# Patient Record
Sex: Female | Born: 1960
Health system: Southern US, Community
[De-identification: ages and names within clinical notes are randomized; demographics above are authoritative.]

## PROBLEM LIST (undated history)

## (undated) DIAGNOSIS — G4730 Sleep apnea, unspecified: Secondary | ICD-10-CM

## (undated) DIAGNOSIS — K219 Gastro-esophageal reflux disease without esophagitis: Secondary | ICD-10-CM

## (undated) DIAGNOSIS — J189 Pneumonia, unspecified organism: Secondary | ICD-10-CM

## (undated) DIAGNOSIS — C801 Malignant (primary) neoplasm, unspecified: Secondary | ICD-10-CM

## (undated) DIAGNOSIS — M1990 Unspecified osteoarthritis, unspecified site: Secondary | ICD-10-CM

## (undated) DIAGNOSIS — Z8719 Personal history of other diseases of the digestive system: Secondary | ICD-10-CM

## (undated) HISTORY — PX: HERNIA REPAIR: SHX51

## (undated) HISTORY — PX: HEEL SPUR SURGERY: SHX665

## (undated) HISTORY — PX: KNEE ARTHROSCOPY: SUR90

## (undated) HISTORY — PX: LAPAROSCOPIC GASTRIC BANDING: SHX1100

## (undated) HISTORY — PX: ABDOMINAL HYSTERECTOMY: SHX81

## (undated) HISTORY — PX: TONSILLECTOMY: SUR1361

## (undated) HISTORY — PX: FRACTURE SURGERY: SHX138

---

## 2007-10-28 ENCOUNTER — Ambulatory Visit: Payer: Self-pay | Admitting: Internal Medicine

## 2009-08-11 ENCOUNTER — Ambulatory Visit: Payer: Self-pay | Admitting: Obstetrics and Gynecology

## 2009-08-13 ENCOUNTER — Ambulatory Visit: Payer: Self-pay | Admitting: Unknown Physician Specialty

## 2011-08-08 ENCOUNTER — Ambulatory Visit: Payer: Self-pay | Admitting: Family Medicine

## 2012-04-30 ENCOUNTER — Ambulatory Visit: Payer: Self-pay | Admitting: Obstetrics and Gynecology

## 2012-04-30 LAB — URINALYSIS, COMPLETE
Bacteria: NONE SEEN
Bilirubin,UR: NEGATIVE
Blood: NEGATIVE
Glucose,UR: NEGATIVE mg/dL
Ketone: NEGATIVE
Leukocyte Esterase: NEGATIVE
Nitrite: NEGATIVE
Ph: 7
Protein: NEGATIVE
RBC,UR: <1 /HPF
Specific Gravity: 1.009
Squamous Epithelial: <1
WBC UR: <1 /HPF

## 2012-04-30 LAB — HEMOGLOBIN: HGB: 15.2 g/dL

## 2012-05-13 ENCOUNTER — Ambulatory Visit: Payer: Self-pay | Admitting: Obstetrics and Gynecology

## 2012-05-13 LAB — PREGNANCY, URINE: Pregnancy Test, Urine: NEGATIVE m[IU]/mL

## 2012-05-14 LAB — PATHOLOGY REPORT

## 2012-05-14 LAB — HEMATOCRIT: HCT: 42.2 % (ref 35.0–47.0)

## 2012-08-08 ENCOUNTER — Ambulatory Visit: Payer: Self-pay | Admitting: Obstetrics and Gynecology

## 2012-12-11 DIAGNOSIS — J189 Pneumonia, unspecified organism: Secondary | ICD-10-CM

## 2012-12-11 HISTORY — DX: Pneumonia, unspecified organism: J18.9

## 2013-03-01 ENCOUNTER — Ambulatory Visit: Payer: Self-pay | Admitting: Physical Medicine and Rehabilitation

## 2013-07-02 ENCOUNTER — Encounter: Payer: Self-pay | Admitting: Physical Medicine and Rehabilitation

## 2013-07-11 ENCOUNTER — Encounter: Payer: Self-pay | Admitting: Physical Medicine and Rehabilitation

## 2013-08-11 ENCOUNTER — Encounter: Payer: Self-pay | Admitting: Physical Medicine and Rehabilitation

## 2013-08-12 ENCOUNTER — Ambulatory Visit: Payer: Self-pay | Admitting: Obstetrics and Gynecology

## 2013-09-01 ENCOUNTER — Other Ambulatory Visit: Payer: Self-pay | Admitting: Family

## 2013-09-01 LAB — CBC WITH DIFFERENTIAL/PLATELET
Basophil #: 0 10*3/uL (ref 0.0–0.1)
Basophil %: 0.3 %
Eosinophil %: 1.3 %
HGB: 14.9 g/dL (ref 12.0–16.0)
Lymphocyte #: 3 10*3/uL (ref 1.0–3.6)
Lymphocyte %: 31.4 %
MCH: 33.5 pg (ref 26.0–34.0)
MCV: 94 fL (ref 80–100)
Monocyte #: 0.8 x10 3/mm (ref 0.2–0.9)
Neutrophil #: 5.5 10*3/uL (ref 1.4–6.5)
Neutrophil %: 58.4 %
Platelet: 222 10*3/uL (ref 150–440)
RDW: 12.3 % (ref 11.5–14.5)
WBC: 9.5 10*3/uL (ref 3.6–11.0)

## 2013-09-01 LAB — COMPREHENSIVE METABOLIC PANEL
Alkaline Phosphatase: 134 U/L (ref 50–136)
Anion Gap: 7 (ref 7–16)
BUN: 14 mg/dL (ref 7–18)
Calcium, Total: 8.9 mg/dL (ref 8.5–10.1)
Osmolality: 275 (ref 275–301)
Potassium: 3.8 mmol/L (ref 3.5–5.1)
SGOT(AST): 32 U/L (ref 15–37)
Sodium: 137 mmol/L (ref 136–145)

## 2013-09-01 LAB — SEDIMENTATION RATE: Erythrocyte Sed Rate: 7 mm/hr (ref 0–30)

## 2013-09-11 ENCOUNTER — Ambulatory Visit: Payer: Self-pay | Admitting: Gastroenterology

## 2013-09-12 LAB — PATHOLOGY REPORT

## 2014-06-02 ENCOUNTER — Ambulatory Visit: Payer: Self-pay | Admitting: Gastroenterology

## 2014-06-29 ENCOUNTER — Ambulatory Visit: Payer: Self-pay | Admitting: Gastroenterology

## 2014-07-01 LAB — PATHOLOGY REPORT

## 2014-08-13 ENCOUNTER — Ambulatory Visit: Payer: Self-pay | Admitting: Obstetrics and Gynecology

## 2014-12-28 ENCOUNTER — Ambulatory Visit: Payer: Self-pay | Admitting: Podiatry

## 2014-12-28 LAB — CBC WITH DIFFERENTIAL/PLATELET
BASOS ABS: 0 10*3/uL (ref 0.0–0.1)
Basophil %: 0.2 %
Eosinophil #: 0.1 10*3/uL (ref 0.0–0.7)
Eosinophil %: 0.8 %
HCT: 43.9 % (ref 35.0–47.0)
HGB: 14.8 g/dL (ref 12.0–16.0)
LYMPHS ABS: 3.2 10*3/uL (ref 1.0–3.6)
Lymphocyte %: 30.2 %
MCH: 32.5 pg (ref 26.0–34.0)
MCHC: 33.7 g/dL (ref 32.0–36.0)
MCV: 97 fL (ref 80–100)
Monocyte #: 0.8 x10 3/mm (ref 0.2–0.9)
Monocyte %: 7.2 %
Neutrophil #: 6.4 10*3/uL (ref 1.4–6.5)
Neutrophil %: 61.6 %
PLATELETS: 225 10*3/uL (ref 150–440)
RBC: 4.55 10*6/uL (ref 3.80–5.20)
RDW: 12.5 % (ref 11.5–14.5)
WBC: 10.4 10*3/uL (ref 3.6–11.0)

## 2015-01-01 ENCOUNTER — Ambulatory Visit: Payer: Self-pay | Admitting: Podiatry

## 2015-04-04 NOTE — Op Note (Signed)
PATIENT NAME:  Lynn Dennis, Lynn Dennis MR#:  [id number] DATE OF BIRTH:  09/30/1961  DATE OF PROCEDURE:  05/13/2012  PREOPERATIVE DIAGNOSIS: Symptomatic fibroid uterus.   POSTOPERATIVE DIAGNOSIS: Symptomatic fibroid uterus.  PROCEDURE: Laparoscopic supracervical hysterectomy and bilateral salpingo-oophorectomy.   SURGEON: Ricky L. Amalia Hailey, MD  ASSISTANT: Laverta Baltimore, MD  ANESTHESIA: General endotracheal by Dr. Marcello Moores.   FINDINGS: Enlarged fibroid uterus, grossly normal tubes and ovaries.   ESTIMATED BLOOD LOSS: 30 mL.   COMPLICATIONS: None.   SPECIMENS: Body of uterus, bilateral tubes, and ovaries.   DRAINS: Foley, removed at the end of the case. Instrument, needle, and sponge counts were correct.   ANTIBIOTICS: None.   PROCEDURE IN DETAIL: The patient was consented and taken to the operating room and placed in the supine position, prepped and draped in the usual sterile fashion after being placed in dorsal lithotomy using Allen stirrups. The cervix was visualized and grasped with a single-tooth and sound was placed through the cervical os and these were Steri-Stripped together and Foley catheter was placed.   An infraumbilical incision was made to permit a #75 port entry with pneumoperitoneum established with findings as noted above. Pneumoperitoneum was established and she was placed in Trendelenburg position. Under direct visualization, I placed 11 mm ports in bilateral lower quadrants. We proceeded with [facility] in the usual fashion with Harmonic scalpel and bipolar when needed. BSO was then carried out with bipolar used on the infundibulopelvics bilaterally.   The left lower quadrant port site was switched out for a morcellator which was then used to remove the uterus in morcellator fashion along with fibroids. The ovaries were removed as well. These were all sent together as uterus, bilateral tubes, and ovaries. The pelvis was copiously irrigated. Small polyps were seen at the right portion  of the cervical stump, which was easily made hemostatic with Kleppinger. With the hand of the operator in the vagina, the cervical canal was cauterized. Pressure was lowered to 6 mmHg. All areas were seen to be hemostatic.   There was a small adhesion at the descending colon, at the left lower quadrant, which was easily reduced with Harmonic scalpel taking care to stay away from the bowel itself.   At this point, the procedure was felt to have achieved maximum efficacy. Ports were removed, pneumoperitoneum was allowed to resolve, and the incisions were closed with deep of zero and subcutaneous with 3-0 Vicryl. Steri-Strips and Band-Aids were placed.   The Foley catheter was removed at the end of the case. A single-tooth tenaculum was removed from the cervix. All instrument, needle, and sponge counts were correct. I anticipate a routine postoperative course.  ____________________________ Rockey Situ. Amalia Hailey, MD rle:slb D: 05/13/2012 08:45:49 ET T: 05/13/2012 10:01:28 ET JOB#: [id number]  cc: Ricky L. Amalia Hailey, MD, <Dictator> Selmer Dominion MD ELECTRONICALLY SIGNED 05/14/2012 14:23

## 2015-04-04 NOTE — Discharge Summary (Signed)
PATIENT NAME:  KEYUANA, WANK MR#:  [id number] DATE OF BIRTH:  November 13, 1961  DATE OF ADMISSION:  05/13/2012 DATE OF DISCHARGE:  05/14/2012  ADMISSION DIAGNOSIS: Fibroid uterus.   DISCHARGE DIAGNOSIS: Fibroid uterus.  PROCEDURE: Laparoscopic supracervical hysterectomy, bilateral salpingo-oophorectomy.   COMPLICATIONS: None.   CONSULTANTS: Anesthesia.   HOSPITAL COURSE: The patient did well postoperatively, tolerating diet on day one, and was discharged home with routine prescriptions, precautions, and follow-up.  ____________________________ Rockey Situ. Amalia Hailey, MD rle:slb D: 05/29/2012 04:42:46 ET T: 05/29/2012 10:19:44 ET JOB#: [id number]  cc: Ricky L. Amalia Hailey, MD, <Dictator> Selmer Dominion MD ELECTRONICALLY SIGNED 05/30/2012 8:59

## 2015-04-05 LAB — SURGICAL PATHOLOGY

## 2015-04-11 NOTE — Op Note (Signed)
PATIENT NAME:  Lynn Dennis, Lynn Dennis MR#:  [id number] DATE OF BIRTH:  03-Jan-1961  DATE OF PROCEDURE:  01/01/2015  PREOPERATIVE DIAGNOSIS: Plantar fasciitis with heel spur, right foot.   POSTOPERATIVE DIAGNOSIS: Plantar fasciitis with heel spur, right foot.   PROCEDURE: Heel spur resection with partial plantar fasciectomy, right heel.   SURGEON: Sharlotte Alamo, DPM  ANESTHESIA: General.   HEMOSTASIS: Pneumatic tourniquet, right calf, 250 mmHg.   ESTIMATED BLOOD LOSS: Minimal.   PATHOLOGY: Soft tissue, right heel.   COMPLICATIONS: None apparent.  INJECTABLES: 10 mL of 0.5% bupivacaine plain.   OPERATIVE INDICATIONS: This is a 54 year old female with a chronic history of plantar fasciitis and heel spur, which was nonresponsive to conservative treatments. The patient elects to undergo surgical intervention for removal of the spur and a portion of her plantar fascia.   DESCRIPTION OF PROCEDURE: The patient was taken to the operating room and placed on the table in the supine position. After undergoing satisfactory general anesthesia, a pneumatic tourniquet was applied at the level of the right ankle and the foot was prepped and draped in the usual sterile fashion. The foot was exsanguinated and the tourniquet inflated to 250 mmHg.   Attention was then directed to the medial aspect of the right heel where an approximate 3.5 cm linear incision was made coursing proximal to distal, centered over the calcaneal tubercle. The incision was deepened via sharp and blunt dissection down to the level of the plantar fascia where a periosteal incision was made on the heel and carried distally into the medial band of the fascia. The fibers were then removed from the heel medially using a Key elevator and using a Beaver blade the plantar fascia was resected from its insertion into the calcaneus. A pair of Kocher clamps was applied approximately 1 cm distal and a 1 cm section was resected and removed in toto. At this  point, there was excellent visualization of the spur, which was removed using a rongeur and rasped smooth using a mechanical rasp. It was also noted at this point that there was a visible bursa in the plantar tissues and this was resected using tenotomy scissors. Intraoperative FluoroScan views revealed good reduction of the spur, on the calcaneus, and the wound was flushed with copious amounts of sterile saline and closed using 3-0 Vicryl simple interrupted sutures for deep subcutaneous closure followed by 4-0 Vicryl simple interrupted sutures for superficial subcutaneous followed by 4-0 nylon simple interrupted sutures for skin closure. Then 2-0 nylon vertical mattress sutures were applied for retention across the wound. Xeroform and a sterile bandage were applied to the right foot having injected 10 mL of 0.5% bupivacaine plain into the wound prior to bandaging. The tourniquet was released and blood flow noted to return immediately to all digits. A posterior splint was then applied and the patient was awakened, extubated and transported to the PACU with vital signs stable and in good condition.   ____________________________ Sharlotte Alamo, DPM tc:sb D: 01/01/2015 14:22:38 ET T: 01/01/2015 15:10:40 ET JOB#: [id number]  cc: Sharlotte Alamo, DPM, <Dictator> Leah Skora DPM ELECTRONICALLY SIGNED 01/04/2015 13:14

## 2015-05-07 ENCOUNTER — Other Ambulatory Visit: Payer: Self-pay | Admitting: Obstetrics and Gynecology

## 2015-05-07 DIAGNOSIS — Z1231 Encounter for screening mammogram for malignant neoplasm of breast: Secondary | ICD-10-CM

## 2015-07-13 ENCOUNTER — Other Ambulatory Visit: Payer: Self-pay | Admitting: Surgery

## 2015-07-13 DIAGNOSIS — M7581 Other shoulder lesions, right shoulder: Secondary | ICD-10-CM

## 2015-07-13 DIAGNOSIS — S43431D Superior glenoid labrum lesion of right shoulder, subsequent encounter: Secondary | ICD-10-CM

## 2015-07-19 DIAGNOSIS — M7581 Other shoulder lesions, right shoulder: Secondary | ICD-10-CM | POA: Insufficient documentation

## 2015-07-19 DIAGNOSIS — S43431D Superior glenoid labrum lesion of right shoulder, subsequent encounter: Secondary | ICD-10-CM | POA: Insufficient documentation

## 2015-07-19 DIAGNOSIS — M7521 Bicipital tendinitis, right shoulder: Secondary | ICD-10-CM | POA: Insufficient documentation

## 2015-07-19 DIAGNOSIS — X58XXXD Exposure to other specified factors, subsequent encounter: Secondary | ICD-10-CM | POA: Insufficient documentation

## 2015-07-19 DIAGNOSIS — M65811 Other synovitis and tenosynovitis, right shoulder: Secondary | ICD-10-CM | POA: Insufficient documentation

## 2015-07-20 ENCOUNTER — Ambulatory Visit
Admission: RE | Admit: 2015-07-20 | Discharge: 2015-07-20 | Disposition: A | Discharge status: Home / Self Care | Payer: 59 | Source: Ambulatory Visit | Attending: Surgery | Admitting: Surgery

## 2015-07-20 DIAGNOSIS — M7581 Other shoulder lesions, right shoulder: Secondary | ICD-10-CM

## 2015-07-20 DIAGNOSIS — S43431D Superior glenoid labrum lesion of right shoulder, subsequent encounter: Secondary | ICD-10-CM

## 2015-07-29 ENCOUNTER — Ambulatory Visit
Admission: RE | Admit: 2015-07-29 | Discharge: 2015-07-29 | Disposition: A | Discharge status: Home / Self Care | Payer: 59 | Source: Ambulatory Visit | Attending: Surgery | Admitting: Surgery

## 2015-07-29 DIAGNOSIS — M7581 Other shoulder lesions, right shoulder: Secondary | ICD-10-CM | POA: Insufficient documentation

## 2015-07-29 DIAGNOSIS — S43431D Superior glenoid labrum lesion of right shoulder, subsequent encounter: Secondary | ICD-10-CM | POA: Insufficient documentation

## 2015-07-29 DIAGNOSIS — X58XXXD Exposure to other specified factors, subsequent encounter: Secondary | ICD-10-CM | POA: Diagnosis not present

## 2015-07-29 MED ORDER — GADOBENATE DIMEGLUMINE 529 MG/ML IV SOLN: 0.1000 mL | Freq: Once | INTRAVENOUS | Status: DC

## 2015-07-29 MED ORDER — IOHEXOL 300 MG/ML  SOLN: 10.0000 mL | Freq: Once | INTRAMUSCULAR | Status: DC | PRN

## 2015-08-09 ENCOUNTER — Encounter
Admission: RE | Admit: 2015-08-09 | Discharge: 2015-08-09 | Disposition: A | Discharge status: Home / Self Care | Payer: 59 | Source: Ambulatory Visit | Attending: Surgery | Admitting: Surgery

## 2015-08-09 DIAGNOSIS — Z79899 Other long term (current) drug therapy: Secondary | ICD-10-CM | POA: Diagnosis not present

## 2015-08-09 DIAGNOSIS — X58XXXA Exposure to other specified factors, initial encounter: Secondary | ICD-10-CM | POA: Diagnosis not present

## 2015-08-09 DIAGNOSIS — Z811 Family history of alcohol abuse and dependence: Secondary | ICD-10-CM | POA: Diagnosis not present

## 2015-08-09 DIAGNOSIS — Z8249 Family history of ischemic heart disease and other diseases of the circulatory system: Secondary | ICD-10-CM | POA: Diagnosis not present

## 2015-08-09 DIAGNOSIS — Z90710 Acquired absence of both cervix and uterus: Secondary | ICD-10-CM | POA: Diagnosis not present

## 2015-08-09 DIAGNOSIS — Z888 Allergy status to other drugs, medicaments and biological substances status: Secondary | ICD-10-CM | POA: Diagnosis not present

## 2015-08-09 DIAGNOSIS — Z885 Allergy status to narcotic agent status: Secondary | ICD-10-CM | POA: Diagnosis not present

## 2015-08-09 DIAGNOSIS — M75111 Incomplete rotator cuff tear or rupture of right shoulder, not specified as traumatic: Secondary | ICD-10-CM | POA: Diagnosis not present

## 2015-08-09 DIAGNOSIS — Z91048 Other nonmedicinal substance allergy status: Secondary | ICD-10-CM | POA: Diagnosis not present

## 2015-08-09 DIAGNOSIS — K219 Gastro-esophageal reflux disease without esophagitis: Secondary | ICD-10-CM | POA: Diagnosis not present

## 2015-08-09 DIAGNOSIS — E739 Lactose intolerance, unspecified: Secondary | ICD-10-CM | POA: Diagnosis not present

## 2015-08-09 DIAGNOSIS — G4730 Sleep apnea, unspecified: Secondary | ICD-10-CM | POA: Diagnosis not present

## 2015-08-09 DIAGNOSIS — M19011 Primary osteoarthritis, right shoulder: Secondary | ICD-10-CM | POA: Diagnosis not present

## 2015-08-09 DIAGNOSIS — M7541 Impingement syndrome of right shoulder: Secondary | ICD-10-CM | POA: Diagnosis not present

## 2015-08-09 DIAGNOSIS — M65811 Other synovitis and tenosynovitis, right shoulder: Secondary | ICD-10-CM | POA: Diagnosis not present

## 2015-08-09 DIAGNOSIS — K900 Celiac disease: Secondary | ICD-10-CM | POA: Diagnosis not present

## 2015-08-09 DIAGNOSIS — S43431A Superior glenoid labrum lesion of right shoulder, initial encounter: Secondary | ICD-10-CM | POA: Diagnosis not present

## 2015-08-09 DIAGNOSIS — Z8262 Family history of osteoporosis: Secondary | ICD-10-CM | POA: Diagnosis not present

## 2015-08-09 HISTORY — DX: Sleep apnea, unspecified: G47.30

## 2015-08-09 LAB — BASIC METABOLIC PANEL
ANION GAP: 8 (ref 5–15)
BUN: 15 mg/dL (ref 6–20)
CO2: 25 mmol/L (ref 22–32)
Calcium: 9.1 mg/dL (ref 8.9–10.3)
Chloride: 103 mmol/L (ref 101–111)
Creatinine, Ser: 0.66 mg/dL (ref 0.44–1.00)
GFR calc non Af Amer: >60 mL/min (ref >60)
GLUCOSE: 123 mg/dL — AB (ref 65–99)
POTASSIUM: 4 mmol/L (ref 3.5–5.1)
Sodium: 136 mmol/L (ref 135–145)

## 2015-08-09 NOTE — Patient Instructions (Signed)
  Your procedure is scheduled on: Thursday 08/12/2015 Report to Day Surgery. 2ND FLOOR MEDICAL MALL ENTRANCE To find out your arrival time please call [phone] between 1PM - 3PM on Wednesday 08/11/2015.  Remember: Instructions that are not followed completely may result in serious medical risk, up to and including death, or upon the discretion of your surgeon and anesthesiologist your surgery may need to be rescheduled.    __X__ 1. Do not eat food or drink liquids after midnight. No gum chewing or hard candies.     __X__ 2. No Alcohol for 24 hours before or after surgery.   ____ 3. Bring all medications with you on the day of surgery if instructed.    __X__ 4. Notify your doctor if there is any change in your medical condition     (cold, fever, infections).     Do not wear jewelry, make-up, hairpins, clips or nail polish.  Do not wear lotions, powders, or perfumes.   Do not shave 48 hours prior to surgery. Men may shave face and neck.  Do not bring valuables to the hospital.    [facility] is not responsible for any belongings or valuables.               Contacts, dentures or bridgework may not be worn into surgery.  Leave your suitcase in the car. After surgery it may be brought to your room.  For patients admitted to the hospital, discharge time is determined by your                treatment team.   Patients discharged the day of surgery will not be allowed to drive home.   Please read over the following fact sheets that you were given:   Surgical Site Infection Prevention   ____ Take these medicines the morning of surgery with A SIP OF WATER:    1.   2.   3.   4.  5.  6.  ____ Fleet Enema (as directed)   __X__ Use CHG Soap as directed  ____ Use inhalers on the day of surgery  ____ Stop metformin 2 days prior to surgery    ____ Take 1/2 of usual insulin dose the night before surgery and none on the morning of surgery.   ____ Stop Coumadin/Plavix/aspirin on    ____ Stop Anti-inflammatories on    ____ Stop supplements until after surgery.    __X__ Bring C-Pap to the hospital.

## 2015-08-12 ENCOUNTER — Ambulatory Visit: Payer: 59 | Admitting: Anesthesiology

## 2015-08-12 ENCOUNTER — Ambulatory Visit
Admission: RE | Admit: 2015-08-12 | Discharge: 2015-08-12 | Disposition: A | Discharge status: Home / Self Care | Payer: 59 | Source: Ambulatory Visit | Attending: Surgery | Admitting: Surgery

## 2015-08-12 ENCOUNTER — Encounter: Payer: Self-pay | Admitting: Anesthesiology

## 2015-08-12 ENCOUNTER — Encounter
Admission: RE | Disposition: A | Discharge status: Home / Self Care | Payer: Self-pay | Source: Ambulatory Visit | Attending: Surgery

## 2015-08-12 DIAGNOSIS — Z885 Allergy status to narcotic agent status: Secondary | ICD-10-CM | POA: Insufficient documentation

## 2015-08-12 DIAGNOSIS — Z888 Allergy status to other drugs, medicaments and biological substances status: Secondary | ICD-10-CM | POA: Insufficient documentation

## 2015-08-12 DIAGNOSIS — Z811 Family history of alcohol abuse and dependence: Secondary | ICD-10-CM | POA: Insufficient documentation

## 2015-08-12 DIAGNOSIS — M65811 Other synovitis and tenosynovitis, right shoulder: Secondary | ICD-10-CM | POA: Insufficient documentation

## 2015-08-12 DIAGNOSIS — K900 Celiac disease: Secondary | ICD-10-CM | POA: Insufficient documentation

## 2015-08-12 DIAGNOSIS — Z90710 Acquired absence of both cervix and uterus: Secondary | ICD-10-CM | POA: Insufficient documentation

## 2015-08-12 DIAGNOSIS — S43431A Superior glenoid labrum lesion of right shoulder, initial encounter: Secondary | ICD-10-CM | POA: Insufficient documentation

## 2015-08-12 DIAGNOSIS — K219 Gastro-esophageal reflux disease without esophagitis: Secondary | ICD-10-CM | POA: Insufficient documentation

## 2015-08-12 DIAGNOSIS — Z8249 Family history of ischemic heart disease and other diseases of the circulatory system: Secondary | ICD-10-CM | POA: Insufficient documentation

## 2015-08-12 DIAGNOSIS — G4730 Sleep apnea, unspecified: Secondary | ICD-10-CM | POA: Insufficient documentation

## 2015-08-12 DIAGNOSIS — M75111 Incomplete rotator cuff tear or rupture of right shoulder, not specified as traumatic: Secondary | ICD-10-CM | POA: Insufficient documentation

## 2015-08-12 DIAGNOSIS — E739 Lactose intolerance, unspecified: Secondary | ICD-10-CM | POA: Insufficient documentation

## 2015-08-12 DIAGNOSIS — X58XXXA Exposure to other specified factors, initial encounter: Secondary | ICD-10-CM | POA: Insufficient documentation

## 2015-08-12 DIAGNOSIS — Z79899 Other long term (current) drug therapy: Secondary | ICD-10-CM | POA: Insufficient documentation

## 2015-08-12 DIAGNOSIS — M7541 Impingement syndrome of right shoulder: Secondary | ICD-10-CM | POA: Diagnosis not present

## 2015-08-12 DIAGNOSIS — Z91048 Other nonmedicinal substance allergy status: Secondary | ICD-10-CM | POA: Insufficient documentation

## 2015-08-12 DIAGNOSIS — M19011 Primary osteoarthritis, right shoulder: Secondary | ICD-10-CM | POA: Insufficient documentation

## 2015-08-12 DIAGNOSIS — Z8262 Family history of osteoporosis: Secondary | ICD-10-CM | POA: Insufficient documentation

## 2015-08-12 HISTORY — PX: SHOULDER ARTHROSCOPY WITH DEBRIDEMENT AND BICEP TENDON REPAIR: SHX5690

## 2015-08-12 SURGERY — SHOULDER ARTHROSCOPY WITH DEBRIDEMENT AND BICEP TENDON REPAIR
Anesthesia: Choice | Laterality: Right | Wound class: Clean

## 2015-08-12 MED ORDER — FAMOTIDINE 20 MG PO TABS
ORAL_TABLET | ORAL | Status: AC
  Administered 2015-08-12: 20 mg via ORAL
  Filled 2015-08-12: qty 1

## 2015-08-12 MED ORDER — FENTANYL CITRATE (PF) 100 MCG/2ML IJ SOLN
INTRAMUSCULAR | Status: AC
  Filled 2015-08-12: qty 2

## 2015-08-12 MED ORDER — GLYCOPYRROLATE 0.2 MG/ML IJ SOLN
INTRAMUSCULAR | Status: DC | PRN
  Administered 2015-08-12: 0.6 mg via INTRAVENOUS
  Administered 2015-08-12: 0.3 mg via INTRAVENOUS

## 2015-08-12 MED ORDER — ONDANSETRON HCL 4 MG PO TABS
4.0000 mg | ORAL_TABLET | Freq: Four times a day (QID) | ORAL | Status: DC | PRN
Start: 2015-08-12 — End: 2015-08-12

## 2015-08-12 MED ORDER — FENTANYL CITRATE (PF) 100 MCG/2ML IJ SOLN: 25.0000 ug | INTRAMUSCULAR | Status: DC | PRN

## 2015-08-12 MED ORDER — METOCLOPRAMIDE HCL 5 MG/ML IJ SOLN: 5.0000 mg | Freq: Three times a day (TID) | INTRAMUSCULAR | Status: DC | PRN

## 2015-08-12 MED ORDER — LIDOCAINE HCL (CARDIAC) 20 MG/ML IV SOLN
INTRAVENOUS | Status: DC | PRN
  Administered 2015-08-12 (×2): 30 mg via INTRAVENOUS

## 2015-08-12 MED ORDER — MIDAZOLAM HCL 5 MG/5ML IJ SOLN
2.0000 mg | Freq: Once | INTRAMUSCULAR | Status: AC
  Administered 2015-08-12: 2 mg via INTRAVENOUS

## 2015-08-12 MED ORDER — FAMOTIDINE 20 MG PO TABS
20.0000 mg | ORAL_TABLET | Freq: Once | ORAL | Status: AC
  Administered 2015-08-12: 20 mg via ORAL

## 2015-08-12 MED ORDER — ONDANSETRON HCL 4 MG/2ML IJ SOLN
4.0000 mg | Freq: Four times a day (QID) | INTRAMUSCULAR | Status: DC | PRN
  Administered 2015-08-12: 4 mg via INTRAVENOUS

## 2015-08-12 MED ORDER — ONDANSETRON HCL 4 MG/2ML IJ SOLN
INTRAMUSCULAR | Status: DC | PRN
  Administered 2015-08-12: 4 mg via INTRAVENOUS

## 2015-08-12 MED ORDER — FENTANYL CITRATE (PF) 100 MCG/2ML IJ SOLN
50.0000 ug | Freq: Once | INTRAMUSCULAR | Status: AC
  Administered 2015-08-12: 50 ug via INTRAVENOUS

## 2015-08-12 MED ORDER — POTASSIUM CHLORIDE IN NACL 20-0.9 MEQ/L-% IV SOLN: INTRAVENOUS | Status: DC

## 2015-08-12 MED ORDER — SODIUM CHLORIDE 0.9 % IV SOLN
10000.0000 ug | INTRAVENOUS | Status: DC | PRN
  Administered 2015-08-12 (×3): .1 ug via INTRAVENOUS

## 2015-08-12 MED ORDER — ROCURONIUM BROMIDE 100 MG/10ML IV SOLN
INTRAVENOUS | Status: DC | PRN
  Administered 2015-08-12: 40 mg via INTRAVENOUS

## 2015-08-12 MED ORDER — BUPIVACAINE-EPINEPHRINE (PF) 0.5% -1:200000 IJ SOLN
INTRAMUSCULAR | Status: AC
  Filled 2015-08-12: qty 30

## 2015-08-12 MED ORDER — ROPIVACAINE HCL 5 MG/ML IJ SOLN
INTRAMUSCULAR | Status: AC
  Filled 2015-08-12: qty 20

## 2015-08-12 MED ORDER — OXYCODONE HCL 5 MG PO TABS: 5.0000 mg | ORAL_TABLET | ORAL | Status: DC | PRN

## 2015-08-12 MED ORDER — SUCCINYLCHOLINE CHLORIDE 20 MG/ML IJ SOLN
INTRAMUSCULAR | Status: DC | PRN
  Administered 2015-08-12: 100 mg via INTRAVENOUS

## 2015-08-12 MED ORDER — METOCLOPRAMIDE HCL 10 MG PO TABS: 5.0000 mg | ORAL_TABLET | Freq: Three times a day (TID) | ORAL | Status: DC | PRN

## 2015-08-12 MED ORDER — EPINEPHRINE HCL 1 MG/ML IJ SOLN
INTRAMUSCULAR | Status: DC | PRN
  Administered 2015-08-12: 1350 mL

## 2015-08-12 MED ORDER — CEFAZOLIN SODIUM-DEXTROSE 2-3 GM-% IV SOLR
2.0000 g | Freq: Once | INTRAVENOUS | Status: AC
  Administered 2015-08-12: 2 g via INTRAVENOUS

## 2015-08-12 MED ORDER — BUPIVACAINE-EPINEPHRINE (PF) 0.5% -1:200000 IJ SOLN
INTRAMUSCULAR | Status: DC | PRN
  Administered 2015-08-12: 5 mL
  Administered 2015-08-12: 20 mL

## 2015-08-12 MED ORDER — ONDANSETRON HCL 4 MG/2ML IJ SOLN: 4.0000 mg | Freq: Once | INTRAMUSCULAR | Status: DC | PRN

## 2015-08-12 MED ORDER — PROPOFOL 10 MG/ML IV BOLUS
INTRAVENOUS | Status: DC | PRN
  Administered 2015-08-12: 150 mg via INTRAVENOUS

## 2015-08-12 MED ORDER — NEOSTIGMINE METHYLSULFATE 10 MG/10ML IV SOLN
INTRAVENOUS | Status: DC | PRN
  Administered 2015-08-12: 3 mg via INTRAVENOUS

## 2015-08-12 MED ORDER — FENTANYL CITRATE (PF) 100 MCG/2ML IJ SOLN
INTRAMUSCULAR | Status: DC | PRN
Start: 2015-08-12 — End: 2015-08-12
  Administered 2015-08-12: 50 ug via INTRAVENOUS

## 2015-08-12 MED ORDER — MIDAZOLAM HCL 5 MG/5ML IJ SOLN
INTRAMUSCULAR | Status: AC
  Filled 2015-08-12: qty 5

## 2015-08-12 MED ORDER — LACTATED RINGERS IV SOLN
INTRAVENOUS | Status: DC
  Administered 2015-08-12: 08:00:00 via INTRAVENOUS

## 2015-08-12 MED ORDER — OXYCODONE HCL 5 MG PO TABS
ORAL_TABLET | ORAL | Status: AC
  Filled 2015-08-12: qty 1

## 2015-08-12 MED ORDER — ONDANSETRON HCL 4 MG/2ML IJ SOLN
INTRAMUSCULAR | Status: AC
  Filled 2015-08-12: qty 2

## 2015-08-12 MED ORDER — MIDAZOLAM HCL 2 MG/2ML IJ SOLN
INTRAMUSCULAR | Status: DC | PRN
  Administered 2015-08-12: 1 mg via INTRAVENOUS

## 2015-08-12 MED ORDER — ROPIVACAINE HCL 2 MG/ML IJ SOLN
INTRAMUSCULAR | Status: AC
  Filled 2015-08-12: qty 20

## 2015-08-12 MED ORDER — EPINEPHRINE HCL 1 MG/ML IJ SOLN
INTRAMUSCULAR | Status: AC
  Filled 2015-08-12: qty 2

## 2015-08-12 MED ORDER — OXYCODONE HCL 5 MG PO TABS
5.0000 mg | ORAL_TABLET | ORAL | Status: DC | PRN
  Administered 2015-08-12: 5 mg via ORAL

## 2015-08-12 MED ORDER — CEFAZOLIN SODIUM-DEXTROSE 2-3 GM-% IV SOLR
INTRAVENOUS | Status: AC
  Filled 2015-08-12: qty 50

## 2015-08-12 SURGICAL SUPPLY — 52 items
ANCHOR JUGGERKNOT WTAP NDL 2.9 (Anchor) ×3 IMPLANT
ANCHOR SUT W/ ORTHOCORD (Anchor) ×3 IMPLANT
BIT DRILL JUGRKNT W/NDL BIT2.9 (DRILL) ×1 IMPLANT
BLADE FULL RADIUS 3.5 (BLADE) IMPLANT
BLADE SHAVER 4.5X7 STR FR (MISCELLANEOUS) ×3 IMPLANT
BUR ACROMIONIZER 4.0 (BURR) IMPLANT
BUR BR 5.5 WIDE MOUTH (BURR) ×3 IMPLANT
CANNULA 8.5X75 THRED (CANNULA) IMPLANT
CANNULA SHAVER 8MMX76MM (CANNULA) ×6 IMPLANT
CHLORAPREP W/TINT 26ML (MISCELLANEOUS) ×3 IMPLANT
COVER LIGHT HANDLE STERIS (MISCELLANEOUS) ×3 IMPLANT
DRAPE IMP U-DRAPE 54X76 (DRAPES) ×6 IMPLANT
DRAPE SURG 17X11 SM STRL (DRAPES) ×3 IMPLANT
DRILL JUGGERKNOT W/NDL BIT 2.9 (DRILL) ×3
DRSG OPSITE POSTOP 4X8 (GAUZE/BANDAGES/DRESSINGS) IMPLANT
GAUZE PETRO XEROFOAM 1X8 (MISCELLANEOUS) ×3 IMPLANT
GAUZE SPONGE 4X4 12PLY STRL (GAUZE/BANDAGES/DRESSINGS) ×3 IMPLANT
GLOVE BIO SURGEON STRL SZ7.5 (GLOVE) ×6 IMPLANT
GLOVE BIO SURGEON STRL SZ8 (GLOVE) ×6 IMPLANT
GLOVE BIOGEL PI IND STRL 8 (GLOVE) ×1 IMPLANT
GLOVE BIOGEL PI INDICATOR 8 (GLOVE) ×2
GLOVE INDICATOR 8.0 STRL GRN (GLOVE) ×3 IMPLANT
GOWN STRL REUS W/ TWL LRG LVL3 (GOWN DISPOSABLE) ×2 IMPLANT
GOWN STRL REUS W/ TWL XL LVL3 (GOWN DISPOSABLE) ×1 IMPLANT
GOWN STRL REUS W/TWL LRG LVL3 (GOWN DISPOSABLE) ×4
GOWN STRL REUS W/TWL XL LVL3 (GOWN DISPOSABLE) ×2
GRASPER SUT 15 45D LOW PRO (SUTURE) ×3 IMPLANT
IV LACTATED RINGER IRRG 3000ML (IV SOLUTION) ×4
IV LR IRRIG 3000ML ARTHROMATIC (IV SOLUTION) ×2 IMPLANT
MANIFOLD NEPTUNE II (INSTRUMENTS) ×3 IMPLANT
MASK FACE SPIDER DISP (MASK) ×3 IMPLANT
MAT BLUE FLOOR 46X72 FLO (MISCELLANEOUS) ×3 IMPLANT
NDL MAYO CATGUT SZ4 (NEEDLE) IMPLANT
NEEDLE MAYO 6 CRC TAPER PT (NEEDLE) IMPLANT
NEEDLE MAYO CATGUT SZ 1.5 (NEEDLE)
NEEDLE MAYO CATGUT SZ 2 (NEEDLE) IMPLANT
NEEDLE REVERSE CUT 1/2 CRC (NEEDLE) IMPLANT
PACK ARTHROSCOPY SHOULDER (MISCELLANEOUS) ×3 IMPLANT
PAD GROUND ADULT SPLIT (MISCELLANEOUS) ×3 IMPLANT
SLING ARM LRG DEEP (SOFTGOODS) IMPLANT
SLING ULTRA II LG (MISCELLANEOUS) ×3 IMPLANT
STAPLER SKIN PROX 35W (STAPLE) ×3 IMPLANT
STRAP SAFETY BODY (MISCELLANEOUS) ×3 IMPLANT
SUT ETHIBOND 0 MO6 C/R (SUTURE) ×3 IMPLANT
SUT PROLENE 4 0 PS 2 18 (SUTURE) ×3 IMPLANT
SUT VIC AB 2-0 CT1 27 (SUTURE) ×4
SUT VIC AB 2-0 CT1 TAPERPNT 27 (SUTURE) ×2 IMPLANT
TAPE MICROFOAM 4IN (TAPE) ×3 IMPLANT
TUBING ARTHRO INFLOW-ONLY STRL (TUBING) ×3 IMPLANT
TUBING CONNECTING 10 (TUBING) ×2 IMPLANT
TUBING CONNECTING 10' (TUBING) ×1
WAND HAND CNTRL MULTIVAC 90 (MISCELLANEOUS) ×3 IMPLANT

## 2015-08-12 NOTE — H&P (Signed)
Paper H&P to be scanned into permanent record. H&P reviewed. No changes. 

## 2015-08-12 NOTE — Anesthesia Procedure Notes (Addendum)
Procedure Name: Intubation Date/Time: 08/12/2015 9:48 AM Performed by: Courtney Paris Pre-anesthesia Checklist: Patient identified, Emergency Drugs available, Suction available and Patient being monitored Patient Re-evaluated:Patient Re-evaluated prior to inductionOxygen Delivery Method: Simple face mask Preoxygenation: Pre-oxygenation with 100% oxygen Intubation Type: IV induction and Combination inhalational/ intravenous induction Ventilation: Mask ventilation without difficulty Laryngoscope Size: 3 Grade View: Grade II Tube type: Oral Tube size: 7.0 mm Number of attempts: 1 Airway Equipment and Method: Stylet Placement Confirmation: ETT inserted through vocal cords under direct vision,  positive ETCO2,  CO2 detector and breath sounds checked- equal and bilateral Secured at: 22 cm Tube secured with: Tape Dental Injury: Teeth and Oropharynx as per pre-operative assessment     Anesthesia Regional Block:  Interscalene brachial plexus block  Pre-Anesthetic Checklist: ,, timeout performed, Correct Patient, Correct Site, Correct Laterality, Correct Procedure, Correct Position, site marked, Risks and benefits discussed,  Surgical consent,  Pre-op evaluation,  At surgeon's request and post-op pain management  Laterality: Right  Prep: Betadine and alcohol swabs       Needles:  Injection technique: Single-shot  Needle Type: Stimiplex     Needle Length: 5cm 5 cm Needle Gauge: 22 and 22 G    Additional Needles:  Procedures: ultrasound guided (picture in chart) and nerve stimulator Interscalene brachial plexus block  Nerve Stimulator or Paresthesia:  Response: biceps flexion, 0.4 mA,   Additional Responses:   Narrative:  Injection made incrementally with aspirations every 5 mL.  Performed by: Personally  Anesthesiologist: Alvin Critchley  Additional Notes: Functioning IV was confirmed and monitors were applied.  A 59mm 22ga Stimuplex needle was used. Sterile prep and  drape,hand hygiene and sterile gloves were used.  Negative aspiration and negative test dose prior to incremental administration of local anesthetic. The patient tolerated the procedure well. The procedure was performed after time out  in a sterile setting.   A skin wheal was made with 1% lidocaine plain.  A 22G stimuplex needle was advanced to the C5 direction with a twitch found down to 0.5 mAmps before fade.  Incremental injection of 20 cc of an equal mix of 0.2% and 0.5% Ropivacaine with epi incrementally injected after negative aspirations.  Patient did well with the procedure

## 2015-08-12 NOTE — Discharge Instructions (Addendum)
Keep dressing dry and intact.  May shower after dressing changed on post-op day #4 (Monday).  Cover staples/sutures with Band-Aids after drying off. Apply ice frequently to shoulder. Keep shoulder immobilizer on at all times except may remove for bathing purposes. Follow-up in 10-14 days or as scheduled.AMBULATORY SURGERY        DISCHARGE INSTRUCTIONS    1) The drugs that you were given will stay in your system until tomorrow so for the next 24 hours you should not:  A) Drive an automobile B) Make any legal decisions C) Drink any alcoholic beverage   2) You may resume regular meals tomorrow.  Today it is better to start with liquids and gradually work up to solid foods.  You may eat anything you prefer, but it is better to start with liquids, then soup and crackers, and gradually work up to solid foods.   3) Please notify your doctor immediately if you have any unusual bleeding, trouble breathing, redness and pain at the surgery site, drainage, fever, or pain not relieved by medication. 4)   5) Your post-operative visit with Dr.                                     is: Date:                        Time:    Please call to schedule your post-operative visit.  6) Additional Instructions:

## 2015-08-12 NOTE — Transfer of Care (Signed)
Immediate Anesthesia Transfer of Care Note  Patient: Lynn Dennis  Procedure(s) Performed: Procedure(s): SHOULDER ARTHROSCOPY WITH DEBRIDMENT, DECOMPRESSION, SLAP REPAIR, MINI OPEN BICEPS TENDONESIS (Right)  Patient Location: PACU  Anesthesia Type:General and GA combined with regional for post-op pain  Level of Consciousness: awake and patient cooperative  Airway & Oxygen Therapy: Patient Spontanous Breathing and Patient connected to face mask oxygen  Post-op Assessment: Report given to RN and Post -op Vital signs reviewed and stable  Post vital signs: Reviewed and stable  Last Vitals:  Filed Vitals:   08/12/15 0845  BP: 127/77  Pulse:   Temp:   Resp: 20    Complications: No apparent anesthesia complications

## 2015-08-12 NOTE — Op Note (Signed)
08/12/2015  11:17 AM  Patient:   Lynn Dennis  Pre-Op Diagnosis:   Impingement/tendinopathy with partial-thickness rotator cuff tear, SLAP tear, and extensive synovitis, right shoulder.  Postoperative diagnosis: Impingement/tendinopathy with partial-thickness rotator cuff tear, SLAP tear, degenerative joint disease, and extensive synovitis, right shoulder.  Procedure: Arthroscopic SLAP repair, extensive arthroscopic debridement, arthroscopic subacromial decompression, and mini-open biceps tenodesis, right shoulder.  Anesthesia: General endotracheal with interscalene block placed preoperatively by the anesthesiologist.  Surgeon:   Pascal Lux, MD  Assistant:   Cameron Proud, PA-C  Findings: As above. The labral tear extended from the 10:30 to the 12:30 position with some inferior subluxation and fraying superiorly and anteriorly. There was grade 3-4 chondromalacia involving the anterior portion of the glenoid with labral fraying anteriorly that extended to approximate the 5:00 position. There is no frank detachment of the labrum anteriorly. There was an articular surface partial thickness tear involving the anterior insertional fibers of the super spinatus which involved approximately 15-20% of the footprint. The biceps tendon was in satisfactory condition, as was the articular surface of the humeral head.  Complications: None  Fluids:   800 cc  Estimated blood loss: 10 cc  Tourniquet time: None  Drains: None  Closure: Staples   Brief clinical note: The patient is a 54 year old female with a 1+ year history of progressively worsening right shoulder pain. The patient's symptoms have progressed despite medications, activity modification, etc. The patient's history and examination are consistent with impingement/tendinopathy with a possible rotator cuff tear. An arthro-MRI scan confirmed the presence of rotator cuff tendinopathy as well as demonstrated a  partial-thickness tear of the articular portion of the anterior insertional fibers of the supraspinatus, I SLAP tear, and biceps tendinopathy. Also demonstrated significant synovitis anteriorly and inferiorly. The patient presents at this time for definitive management of these shoulder symptoms.  Procedure: The patient underwent placement of an interscalene block by the anesthesiologist in the preoperative holding area. The patient was brought into the operating room and lain in the supine position. She underwent general endotracheal intubation and anesthesia before being repositioned in the beach chair position using the beach chair positioner. The right shoulder and upper extremity were prepped with ChloraPrep solution before being draped sterilely. Preoperative antibiotics were administered. A timeout was performed to confirm the proper surgical site before the expected portal sites and incision site were injected with 0.5% Sensorcaine with epinephrine. A posterior portal was created and the glenohumeral joint thoroughly inspected with the findings as described above. An anterior portal was created using an outside-in technique. The labrum and rotator cuff were further probed, again confirming the above-noted findings. The areas of labral fraying were debrided back to stable margins using the full-radius resector. In addition, the area of articular cartilage damage along the anterior portion of the glenoid was debrided back to stable margins, also using the full-radius resector. Finally, the areas of extensive synovitis anteriorly, superiorly, and inferiorly were debrided back to stable margins using the full-radius resector. The ArthroCare wand was inserted and used to obtain hemostasis as well as to "anneal" the labrum superiorly and anteriorly. The exposed glenoid rim was roughened with the full-radius resector down to bleeding bone before the labrum was stabilized using a single BioKnotless anchor placed  at approximately the 12:00 position. This was performed through a superolateral portal which was created using an outside in technique. Subsequent probing of the repair demonstrated excellent stability. The instruments were removed from the joint after suctioning the excess fluid.  The  camera was repositioned through the posterior portal into the subacromial space. A separate lateral portal was created using an outside-in technique. The 3.5 mm full-radius resector was introduced and used to perform a subtotal bursectomy. The ArthroCare wand was then inserted and used to remove the periosteal tissue off the undersurface of the anterior third of the acromion as well as to recess the coracoacromial ligament from its attachment along the anterior and lateral margins of the acromion. The 4.0 mm acromionizing bur was introduced and used to complete the decompression by removing the undersurface of the anterior third of the acromion. The full radius resector was reintroduced to remove any residual bony debris before the ArthroCare wand was reintroduced to obtain hemostasis. The instruments were then removed from the subacromial space after suctioning the excess fluid.  An approximately 4-5 cm incision was made over the anterolateral aspect of the shoulder beginning at the anterolateral corner of the acromion and extending distally in line with the bicipital groove, incorporating the superolateral portal site. This incision was carried down through the subcutaneous tissues to expose the deltoid fascia. The raphae between the anterior and middle thirds was identified and this plane developed to provide access into the subacromial space. Additional bursal tissues were debrided sharply using Metzenbaum scissors. The rotator cuff tear was carefully inspected. There was only some minor abrasion of the bursal surface of the anterior insertional fibers of the supraspinatus tendon with no full-thickness component identified.  The bicipital groove was identified by palpation and opened for 1-1.5 cm. The biceps tendon stump was retrieved through this defect. The floor of the bicipital groove was roughened with a curet before a Biomet 2.9 mm JuggerKnot anchor was inserted. Both sets of sutures were passed through the biceps tendon and tied securely to effect the tenodesis. The bicipital sheath was reapproximated using two #0 Ethibond interrupted sutures, incorporating the biceps tendon to further reinforce the tenodesis.  The wound was copiously irrigated with sterile saline solution before the deltoid raphae was reapproximated using 2-0 Vicryl interrupted sutures. The subcutaneous tissues were closed in two layers using 2-0 Vicryl interrupted sutures before the skin was closed using staples. The portal sites also were closed using staples. A sterile bulky dressing was applied to the shoulder before the arm was placed into a shoulder immobilizer. The patient was then awakened, extubated, and returned to the recovery room in satisfactory condition after tolerating the procedure well.

## 2015-08-12 NOTE — Progress Notes (Signed)
Pt transported via stretcher to PACU #12 for Block. Report given to P stall RN. Pt needs site marked and md update.

## 2015-08-12 NOTE — Anesthesia Preprocedure Evaluation (Addendum)
Anesthesia Evaluation  Patient identified by MRN, date of birth, ID band Patient awake    Reviewed: Allergy & Precautions, NPO status , Patient's Chart, lab work & pertinent test results  Airway Mallampati: II  TM Distance: <3 FB     Dental no notable dental hx.    Pulmonary sleep apnea and Continuous Positive Airway Pressure Ventilation ,    Pulmonary exam normal       Cardiovascular negative cardio ROS Normal cardiovascular exam    Neuro/Psych negative neurological ROS  negative psych ROS   GI/Hepatic negative GI ROS, Neg liver ROS,   Endo/Other  negative endocrine ROS  Renal/GU negative Renal ROS  negative genitourinary   Musculoskeletal negative musculoskeletal ROS (+)   Abdominal Normal abdominal exam  (+)   Peds negative pediatric ROS (+)  Hematology negative hematology ROS (+)   Anesthesia Other Findings   Reproductive/Obstetrics                             Anesthesia Physical Anesthesia Plan  ASA: II  Anesthesia Plan: General   Post-op Pain Management: MAC Combined w/ Regional for Post-op pain   Induction: Intravenous  Airway Management Planned: Oral ETT  Additional Equipment:   Intra-op Plan:   Post-operative Plan: Extubation in OR  Informed Consent: I have reviewed the patients History and Physical, chart, labs and discussed the procedure including the risks, benefits and alternatives for the proposed anesthesia with the patient or authorized representative who has indicated his/her understanding and acceptance.   Dental advisory given  Plan Discussed with: CRNA and Surgeon  Anesthesia Plan Comments: (Discussed the pros and cons of interscalene block for post op pain and patient wishes to proceed with the block with full understanding of the risks.)        Anesthesia Quick Evaluation

## 2015-08-17 ENCOUNTER — Ambulatory Visit: Payer: Self-pay

## 2015-08-17 NOTE — Anesthesia Postprocedure Evaluation (Addendum)
  Anesthesia Post-op Note  Patient: Lynn Dennis  Procedure(s) Performed: Procedure(s): SHOULDER ARTHROSCOPY WITH DEBRIDMENT, DECOMPRESSION, SLAP REPAIR, MINI OPEN BICEPS TENDONESIS (Right)  Anesthesia type:General with interscalene block for post op pain relief  Patient location: PACU  Post pain: Pain level controlled  Post assessment: Post-op Vital signs reviewed, Patient's Cardiovascular Status Stable, Respiratory Function Stable, Patent Airway and No signs of Nausea or vomiting  Post vital signs: Reviewed and stable  Last Vitals:  Filed Vitals:   08/12/15 1219  BP: 101/59  Pulse: 72  Temp: 36.3 C  Resp: 20    Level of consciousness: awake, alert  and patient cooperative  Complications: No apparent anesthesia complications

## 2015-08-26 ENCOUNTER — Ambulatory Visit: Payer: 59 | Attending: Surgery | Admitting: Physical Therapy

## 2015-08-26 ENCOUNTER — Encounter: Payer: Self-pay | Admitting: Physical Therapy

## 2015-08-26 DIAGNOSIS — M25511 Pain in right shoulder: Secondary | ICD-10-CM

## 2015-08-26 DIAGNOSIS — R29898 Other symptoms and signs involving the musculoskeletal system: Secondary | ICD-10-CM | POA: Insufficient documentation

## 2015-08-27 NOTE — Therapy (Signed)
[place] [facility] [address] [place], [address] Phone: [phone]   Fax:  [phone]  Physical Therapy Evaluation  Patient Details  Name: Lynn Dennis MRN: [id number] Date of Birth: June 10, 1961 Referring Provider:  Corky Mull, MD  Encounter Date: 08/26/2015      PT End of Session - 08/26/15 1314    Visit Number 1   Number of Visits 24   Date for PT Re-Evaluation 11/19/15   PT Start Time 0933   PT Stop Time 1018   PT Time Calculation (min) 45 min   Activity Tolerance Patient tolerated treatment well   Behavior During Therapy [facility] for tasks assessed/performed      Past Medical History  Diagnosis Date  . Sleep apnea     Past Surgical History  Procedure Laterality Date  . Heel spur surgery Right   . Abdominal hysterectomy    . Knee arthroscopy Left   . Laparoscopic gastric banding    . Hernia repair    . Tonsillectomy    . Shoulder arthroscopy with debridement and bicep tendon repair Right 08/12/2015    Procedure: SHOULDER ARTHROSCOPY WITH DEBRIDMENT, DECOMPRESSION, SLAP REPAIR, MINI OPEN BICEPS TENDONESIS;  Surgeon: Corky Mull, MD;  Location: [facility] ORS;  Service: Orthopedics;  Laterality: Right;    There were no vitals filed for this visit.  Visit Diagnosis:  Pain in right shoulder  Weakness of right upper extremity      Subjective Assessment - 08/26/15 0938    Subjective Patient reports her right shoulder is hurting and she feels "out of alignment"    Pertinent History Patient reports injuring right shoulder labor day 2015 while playing pool volleyball. She reports she then went kayaking about a month later and she felt shoulder was worse. She then felt she may have been improving and then didn't feel she was improving and went to Dr. Roland Rack and  had MRI and  underwent shoulder surgery for SLAP repair, debridement, biceps tenodesis,    Patient Stated Goals Patient would like to be able to use  her right shoulder/arm for return to prior level of function with playing banjo, play tennis and volleyball   Currently in Pain? Yes   Pain Score 5   Ranges from 1/10 up to 8/10   Pain Location Shoulder   Pain Orientation Right   Pain Descriptors / Indicators Aching   Pain Type Surgical pain   Pain Onset 1 to 4 weeks ago   Effect of Pain on Daily Activities limited per protocol at present time   Multiple Pain Sites No            [facility] PT Assessment - 08/26/15 0950    Assessment   Medical Diagnosis S43.431D Superior glenoid labrum lesion of right shoulder, subsequent encounter,, s/p arthroscopic SLAP repair with decompression and biceps tenodesis right shoulder   Onset Date/Surgical Date 08/12/15   Hand Dominance Right   Next MD Visit 09/24/2015   Prior Therapy none   Precautions   Precautions Other (comment)  per protocol   Required Braces or Orthoses Sling   Restrictions   Weight Bearing Restrictions No   Balance Screen   Has the patient fallen in the past 6 months No   Has the patient had a decrease in activity level because of a fear of falling?  No   Is the patient reluctant to leave their home because of a fear of falling?  No  Home Environment   Living Environment Private residence   Living Arrangements Spouse/significant other   Prior Function   Level of Independence Independent    Observation: patient wearing sling right UE, with pillow supporting arm, mild hiking of right shoulder noted PROM: right shoulder to 90 degrees, ER to 0 degrees, no Active biceps per protocol for SLAP repair  Limited evaluation per guidelines of surgery/protocol          PT Long Term Goals - 08/26/15 1329    PT LONG TERM GOAL #1   Title Patient will demonstrate improved funciton with decreased self perceived disabiltiy as indicated by quick Dash score of 50% impairment by 10/07/2015   Baseline 89% Quick Dash   Status New   PT LONG TERM GOAL #2   Title Patient will demonstrate  improved funciton with decreased self perceived disabiltiy as indicated by quick Dash score of 30% impairment by 11/19/2015   Baseline 89% Quick Dash   Status New   PT LONG TERM GOAL #3   Title Patient will have full AROM right shoulder in order to perform personal care and reach overhead with minimal difficulty by 10/07/2015   Baseline limited per protocol guidelines/s/p surgery   Status New   PT LONG TERM GOAL #4   Title Patient will be independent with home program for pain control and home program for improving flexibility and strength in order to continue with self management/return to prior level of function by 11/19/2015   Baseline limited knowledge of appropriate pain control strategies and progression of exercises to regain full motion/strength right UE s/p surgery within appropriate guidelines   Status New               Plan - 08/26/15 1322    Clinical Impression Statement Patient is a 54 year old right hand dominant female who presents s/p arthroscopic SLAP repair, extensive arthroscopic debridement, subacromial decompression and mini-open biceps tenodeiss right shoulder. She has limitiation in all activites for personal care and household activites using right UE. She has pain and weakness right shoulder/UE and will require skilled physical therapy intervention to progress through appropriate exercises per guidelines of protocol in order for her to achieve full functional use of right UE and return to prior level of funciton.    Pt will benefit from skilled therapeutic intervention in order to improve on the following deficits Decreased strength;Pain;Impaired UE functional use;Decreased range of motion   Rehab Potential Good   Clinical Impairments Affecting Rehab Potential (+) acute condition, motivated, age   PT Frequency 2x / week   PT Duration 12 weeks   PT Treatment/Interventions Therapeutic exercise;Moist Heat;Electrical Stimulation;Cryotherapy;Patient/family  education;Manual techniques;Ultrasound   PT Next Visit Plan pain control, progress ROM per guidelines of protocol   PT Home Exercise Plan instructed in precautions of using right UE for daily activities and keeping sling in place, pendulums for exericse, use of ice/heat as needed   Consulted and Agree with Plan of Care Patient         Problem List There are no active problems to display for this patient.   Jomarie Longs PT 08/27/2015, 1:44 PM  [place] [facility] [address] [place], [address] Phone: [phone]   Fax:  [phone]

## 2015-08-30 ENCOUNTER — Ambulatory Visit: Payer: 59 | Admitting: Physical Therapy

## 2015-08-30 ENCOUNTER — Encounter: Payer: Self-pay | Admitting: Physical Therapy

## 2015-08-30 DIAGNOSIS — R29898 Other symptoms and signs involving the musculoskeletal system: Secondary | ICD-10-CM

## 2015-08-30 DIAGNOSIS — M25511 Pain in right shoulder: Secondary | ICD-10-CM | POA: Diagnosis not present

## 2015-08-30 NOTE — Therapy (Signed)
[place] [facility] [address] [place], [address] Phone: [phone]   Fax:  [phone]  Physical Therapy Treatment  Patient Details  Name: Lynn Dennis MRN: [id number] Date of Birth: May 17, 1961 Referring Provider:  Corky Mull, MD  Encounter Date: 08/30/2015      PT End of Session - 08/30/15 1208    Visit Number 2   Number of Visits 24   Date for PT Re-Evaluation 11/19/15   PT Start Time 0948   PT Stop Time 1028   PT Time Calculation (min) 40 min   Activity Tolerance Patient tolerated treatment well   Behavior During Therapy [facility] for tasks assessed/performed      Past Medical History  Diagnosis Date  . Sleep apnea     Past Surgical History  Procedure Laterality Date  . Heel spur surgery Right   . Abdominal hysterectomy    . Knee arthroscopy Left   . Laparoscopic gastric banding    . Hernia repair    . Tonsillectomy    . Shoulder arthroscopy with debridement and bicep tendon repair Right 08/12/2015    Procedure: SHOULDER ARTHROSCOPY WITH DEBRIDMENT, DECOMPRESSION, SLAP REPAIR, MINI OPEN BICEPS TENDONESIS;  Surgeon: Corky Mull, MD;  Location: [facility] ORS;  Service: Orthopedics;  Laterality: Right;    There were no vitals filed for this visit.  Visit Diagnosis:  Pain in right shoulder  Weakness of right upper extremity      Subjective Assessment - 08/30/15 1005    Subjective Patient reports her right shoulder is hurting and her neck is still tight. She is doing exercises of pendulum and wearing sling with right arm supported on pillow of sling.    Patient Stated Goals Patient would like to be able to use her right shoulder/arm for return to prior level of function with playing banjo, play tennis and volleyball   Currently in Pain? Yes   Pain Score 3    Pain Location Shoulder   Pain Orientation Right   Pain Descriptors / Indicators Aching   Pain Type Surgical pain   Pain Onset 1 to 4 weeks  ago   Multiple Pain Sites No      Observation:  Posture: patient is wearing sling with pillow of sling supporting right UE, she is carrying a coffee in her right hand and was instructed not to carry anything in that hand due to precautions for first 6 weeks Palpation: decreased soft tissue elasticity in right upper arm, along upper trapezius muscle and into right side cervical spine with + spasms       [facility] Adult PT Treatment/Exercise - 08/30/15 1006    Exercises   Exercises Other Exercises   Other Exercises  PROM right shoulder 90 forward elevation, neurtral ER, IR across trunk , abduction to 45 degrees all with assistance of therapist 2 x 10 reps with verbal cues   Manual Therapy   Manual Therapy Soft tissue mobilization   Soft tissue mobilization cervical spine, right upper trapezius into right upper arm with patient supine, PROM stretch to cervical spine lateral flexion to stretch upper trapezius muscles, suboccipital STM with release to relieve pain and tenderness      Patient response to treatment: decreased soft tissue spasms and improved soft tissue elasticity with decreased tenderness and pain at end of session by 25%. Patient with mild discomfort at end range of forward elevation and ER. Required constant cuing to perform exercise with relaxed posiitons  PT Education - 08/30/15 1206    Education provided Yes   Education Details Paitent home program reinforced    Person(s) Educated Patient   Methods Explanation;Demonstration;Verbal cues   Comprehension Verbalized understanding;Returned demonstration;Verbal cues required             PT Long Term Goals - 08/26/15 1329    PT LONG TERM GOAL #1   Title Patient will demonstrate improved function with decreased self perceived disabiltiy as indicated by quick Dash score of 50% impairment by 10/07/2015   Baseline 89% Quick Dash   Status New   PT LONG TERM GOAL #2   Title Patient will demonstrate improved funciton  with decreased self perceived disabiltiy as indicated by quick Dash score of 30% impairment by 11/19/2015   Baseline 89% Quick Dash   Status New   PT LONG TERM GOAL #3   Title Patient will have full AROM right shoulder in order to perform personal care and reach overhead with minimal difficulty by 10/07/2015   Baseline limited per protocol guidelines/s/p surgery   Status New   PT LONG TERM GOAL #4   Title Patient will be independent with home program for pain control and home program for improving flexibility and strength in order to contineu with self management/return to prior level of function by 11/19/2015   Baseline limited knowledge of appropriate pain control strategies and progression of exercises to regain full motion/strength right UE s/p surgery within appropriate guidelines   Status New               Plan - 08/30/15 1209    Clinical Impression Statement Patient is progressing with decreased pain and improved soft tissue elasticity following treament today. She needs to be aware of following her precautions with diligence order to progress so she can return to prior level of function.    Rehab Potential Good   PT Frequency 2x / week   PT Duration 12 weeks   PT Treatment/Interventions Therapeutic exercise;Moist Heat;Electrical Stimulation;Cryotherapy;Patient/family education;Manual techniques;Ultrasound   PT Next Visit Plan pain control, progress ROM per guidelines of protocol        Problem List There are no active problems to display for this patient.   Jomarie Longs PT 08/30/2015, 4:35 PM  [place] [facility] [address], [place], [address] Phone: [phone]   Fax:  [phone]

## 2015-09-03 ENCOUNTER — Encounter: Payer: Self-pay | Admitting: Physical Therapy

## 2015-09-03 ENCOUNTER — Ambulatory Visit: Payer: 59 | Admitting: Physical Therapy

## 2015-09-03 DIAGNOSIS — M25511 Pain in right shoulder: Secondary | ICD-10-CM

## 2015-09-03 DIAGNOSIS — R29898 Other symptoms and signs involving the musculoskeletal system: Secondary | ICD-10-CM

## 2015-09-03 NOTE — Therapy (Signed)
[place] [facility] [address] [place], [address] Phone: [phone]   Fax:  [phone]  Physical Therapy Treatment  Patient Details  Name: Lynn Dennis MRN: [id number] Date of Birth: [birth year] Referring Provider:  Corky Mull, MD  Encounter Date: 09/03/2015      PT End of Session - 09/03/15 1200    Visit Number 3   Number of Visits 24   Date for PT Re-Evaluation 11/19/15   PT Start Time 4827   PT Stop Time 1130   PT Time Calculation (min) 45 min   Activity Tolerance Patient tolerated treatment well   Behavior During Therapy [facility] for tasks assessed/performed      Past Medical History  Diagnosis Date  . Sleep apnea     Past Surgical History  Procedure Laterality Date  . Heel spur surgery Right   . Abdominal hysterectomy    . Knee arthroscopy Left   . Laparoscopic gastric banding    . Hernia repair    . Tonsillectomy    . Shoulder arthroscopy with debridement and bicep tendon repair Right 08/12/2015    Procedure: SHOULDER ARTHROSCOPY WITH DEBRIDMENT, DECOMPRESSION, SLAP REPAIR, MINI OPEN BICEPS TENDONESIS;  Surgeon: Corky Mull, MD;  Location: [facility] ORS;  Service: Orthopedics;  Laterality: Right;    There were no vitals filed for this visit.  Visit Diagnosis:  Pain in right shoulder  Weakness of right upper extremity      Subjective Assessment - 09/03/15 1123    Subjective Patient reports her right upper trapezius is tight and sore from stretching. She reports she is trying not to use right UE and is resting arm as instructed.    Patient Stated Goals Patient would like to be able to use her right shoulder/arm for return to prior level of function with playing banjo, play tennis and volleyball   Currently in Pain? Yes   Pain Score 3    Pain Location Shoulder   Pain Orientation Right   Pain Descriptors / Indicators Aching   Pain Type Surgical pain   Pain Onset 1 to 4 weeks ago   Multiple  Pain Sites No         Observation:  Posture: patient is wearing sling with pillow of sling supporting right UE,  Palpation: decreased soft tissue elasticity in right upper arm, along upper trapezius muscle and into right side cervical spine with + spasms         [facility] Adult PT Treatment/Exercise - 09/03/15 1125    Exercises   Exercises Other Exercises   Other Exercises  PROM right shoulder 90 forward elevation, neurtral ER, IR across trunk , abduction to 45 degrees all with assistance of therapist 2 x 10 reps    Manual Therapy   Manual Therapy Soft tissue mobilization   Soft tissue mobilization cervical spine, right upper trapezius. into right upper arm with patient in sitting with right UE supported on arm of chair with towel under elbow,  PROM stretch to cervical spine lateral flexion to stretch upper trapezius muscles,         Patient response to treatment: decreased soft tissue spasms and improved soft tissue elasticity with decreased tenderness and pain at end of session by 50%. Patient with mild discomfort at end range of forward elevation and ER. Required constant cuing to perform exercise with relaxed shoulder muscles        PT Long Term Goals - 08/26/15 1329  PT LONG TERM GOAL #1   Title Patient will demonstrate improved funciton with decreased self perceived disabiltiy as indicated by quick Dash score of 50% impairment by 10/07/2015   Baseline 89% Quick Dash   Status New   PT LONG TERM GOAL #2   Title Patient will demonstrate improved funciton with decreased self perceived disabiltiy as indicated by quick Dash score of 30% impairment by 11/19/2015   Baseline 89% Quick Dash   Status New   PT LONG TERM GOAL #3   Title Patient will have full AROM right shoulder in order to perform personal care and reach overhead with minimal difficulty by 10/07/2015   Baseline limited per protocol guidelines/s/p surgery   Status New   PT LONG TERM GOAL #4   Title Patient will  be independent with home program for pain control and home program for improving flexibility and strength in order to contineu with self management/return to prior level of function by 11/19/2015   Baseline limited knowledge of appropriate pain control strategies and progression of exercises to regain full motion/strength right UE s/p surgery within appropriate guidelines   Status New           Plan - 09/03/15 1140    Clinical Impression Statement Paitent is 3 weeks post of and is progressing with decresaed pain and improved soft tissue elasticity following treatment today. She needs to be aware of following her precautions with dilidgence in order to regain full function without difficulty.    Pt will benefit from skilled therapeutic intervention in order to improve on the following deficits Decreased strength;Pain;Impaired UE functional use;Decreased range of motion   Rehab Potential Good   PT Frequency 2x / week   PT Duration 12 weeks   PT Treatment/Interventions Therapeutic exercise;Moist Heat;Electrical Stimulation;Cryotherapy;Patient/family education;Manual techniques;Ultrasound   PT Next Visit Plan pain control, progress ROM per guidelines of protocol        Problem List There are no active problems to display for this patient.   Jomarie Longs PT 09/04/2015, 4:50 PM  [place] [facility] [address] [place], [address] Phone: [phone]   Fax:  [phone]

## 2015-09-06 ENCOUNTER — Encounter: Payer: Self-pay | Admitting: Physical Therapy

## 2015-09-06 ENCOUNTER — Ambulatory Visit: Payer: 59 | Admitting: Physical Therapy

## 2015-09-06 DIAGNOSIS — M25511 Pain in right shoulder: Secondary | ICD-10-CM | POA: Diagnosis not present

## 2015-09-06 DIAGNOSIS — R29898 Other symptoms and signs involving the musculoskeletal system: Secondary | ICD-10-CM

## 2015-09-06 NOTE — Therapy (Signed)
[place] [facility] [address], [place], [address] Phone: [phone]   Fax:  [phone]  Physical Therapy Treatment  Patient Details  Name: Lynn Dennis MRN: [id number] Date of Birth: [birth year] Referring Provider:  Corky Mull, MD  Encounter Date: 09/06/2015      PT End of Session - 09/06/15 0957    Visit Number 4   Number of Visits 24   Date for PT Re-Evaluation 11/19/15   PT Start Time 0948   PT Stop Time 1030   PT Time Calculation (min) 42 min   Activity Tolerance Patient tolerated treatment well   Behavior During Therapy [facility] for tasks assessed/performed      Past Medical History  Diagnosis Date  . Sleep apnea     Past Surgical History  Procedure Laterality Date  . Heel spur surgery Right   . Abdominal hysterectomy    . Knee arthroscopy Left   . Laparoscopic gastric banding    . Hernia repair    . Tonsillectomy    . Shoulder arthroscopy with debridement and bicep tendon repair Right 08/12/2015    Procedure: SHOULDER ARTHROSCOPY WITH DEBRIDMENT, DECOMPRESSION, SLAP REPAIR, MINI OPEN BICEPS TENDONESIS;  Surgeon: Corky Mull, MD;  Location: [facility] ORS;  Service: Orthopedics;  Laterality: Right;    There were no vitals filed for this visit.  Visit Diagnosis:  Pain in right shoulder  Weakness of right upper extremity      Subjective Assessment - 09/06/15 0955    Subjective Patient is reporting some soreness in right shoulder and upper arm and upper trapezius muscle. She is trying to obey precautions of not using right UE as much.    Patient Stated Goals Patient would like to be able to use her right shoulder/arm for return to prior level of function with playing banjo, play tennis and volleyball   Currently in Pain? Yes   Pain Score 3    Pain Location Shoulder   Pain Orientation Right   Pain Descriptors / Indicators Aching   Pain Type Surgical pain   Pain Onset 1 to 4 weeks ago   Pain  Frequency Intermittent   Multiple Pain Sites No       Observation:  Posture: patient is wearing sling with pillow of sling supporting right UE,  Palpation: decreased soft tissue elasticity in right upper arm, along upper trapezius muscle and into right side cervical spine with + spasms       [facility] Adult PT Treatment/Exercise - 09/06/15 0957    Exercises   Exercises Other Exercises   Other Exercises  PROM right shoulder 90 forward elevation, neurtral ER, IR across trunk , abduction to 45 degrees all with assistance of therapist 2 x 10 reps    Manual Therapy   Manual Therapy Soft tissue mobilization   Soft tissue mobilization  cervical spine, right upper trapezius. into right upper arm and posterior aspect of shoulder with patient in sitting with right UE supported on arm of chair with towel under elbow, PROM stretch to cervical spine lateral flexion to stretch upper trapezius muscles,        Patient response to treatment: decreased soft tissue spasms and improved soft tissue elasticity with decreased tenderness and pain at end of session by 50%. Patient with mild discomfort at end range of forward elevation and ER. Required constant cuing to perform exercise with relaxed shoulder muscles          PT Education -  09/06/15 1030    Education provided Yes   Education Details Re inforced precautions and use of sling for right UE   Person(s) Educated Patient   Methods Explanation;Demonstration;Verbal cues   Comprehension Verbalized understanding;Returned demonstration;Verbal cues required             PT Long Term Goals - 08/26/15 1329    PT LONG TERM GOAL #1   Title Patient will demonstrate improved funciton with decreased self perceived disabiltiy as indicated by quick Dash score of 50% impairment by 10/07/2015   Baseline 89% Quick Dash   Status New   PT LONG TERM GOAL #2   Title Patient will demonstrate improved funciton with decreased self perceived disabiltiy as  indicated by quick Dash score of 30% impairment by 11/19/2015   Baseline 89% Quick Dash   Status New   PT LONG TERM GOAL #3   Title Patient will have full AROM right shoulder in order to perform personal care and reach overhead with minimal difficulty by 10/07/2015   Baseline limited per protocol guidelines/s/p surgery   Status New   PT LONG TERM GOAL #4   Title Patient will be independent with home program for pain control and home program for improving flexibility and strength in order to contineu with self management/return to prior level of function by 11/19/2015   Baseline limited knowledge of appropriate pain control strategies and progression of exercises to regain full motion/strength right UE s/p surgery within appropriate guidelines   Status New               Plan - 09/06/15 1030    Clinical Impression Statement Decreased spasms and improving ROM right UE with current treatment. She continues to require guidance and instruction to avoid actively using right UE as she heals from surgery.   Pt will benefit from skilled therapeutic intervention in order to improve on the following deficits Decreased strength;Pain;Impaired UE functional use;Decreased range of motion   Rehab Potential Good   PT Frequency 2x / week   PT Duration 12 weeks   PT Treatment/Interventions Therapeutic exercise;Moist Heat;Electrical Stimulation;Cryotherapy;Patient/family education;Manual techniques;Ultrasound   PT Next Visit Plan pain control, progress ROM per guidelines of protocol        Problem List There are no active problems to display for this patient.   Jomarie Longs PT 09/06/2015, 10:13 PM  [place] [facility] [address] [place], [address] Phone: [phone]   Fax:  [phone]

## 2015-09-09 ENCOUNTER — Ambulatory Visit: Payer: 59 | Admitting: Physical Therapy

## 2015-09-09 ENCOUNTER — Encounter: Payer: Self-pay | Admitting: Physical Therapy

## 2015-09-09 DIAGNOSIS — M25511 Pain in right shoulder: Secondary | ICD-10-CM

## 2015-09-09 DIAGNOSIS — R29898 Other symptoms and signs involving the musculoskeletal system: Secondary | ICD-10-CM

## 2015-09-09 NOTE — Therapy (Signed)
[name] [address] [place], [address] Phone: [phone]   Fax:  [phone]  Physical Therapy Treatment  Patient Details  Name: Lynn Dennis MRN: [id number] Date of Birth: [birth year] Referring Provider:  Corky Mull, MD  Encounter Date: 09/09/2015      PT End of Session - 09/09/15 1700    Visit Number 5   Number of Visits 24   Date for PT Re-Evaluation 11/19/15   PT Start Time 1622   PT Stop Time 1655   PT Time Calculation (min) 33 min   Activity Tolerance Patient tolerated treatment well   Behavior During Therapy [facility] for tasks assessed/performed      Past Medical History  Diagnosis Date  . Sleep apnea     Past Surgical History  Procedure Laterality Date  . Heel spur surgery Right   . Abdominal hysterectomy    . Knee arthroscopy Left   . Laparoscopic gastric banding    . Hernia repair    . Tonsillectomy    . Shoulder arthroscopy with debridement and bicep tendon repair Right 08/12/2015    Procedure: SHOULDER ARTHROSCOPY WITH DEBRIDMENT, DECOMPRESSION, SLAP REPAIR, MINI OPEN BICEPS TENDONESIS;  Surgeon: Corky Mull, MD;  Location: [facility] ORS;  Service: Orthopedics;  Laterality: Right;    There were no vitals filed for this visit.  Visit Diagnosis:  Pain in right shoulder  Weakness of right upper extremity      Subjective Assessment - 09/09/15 1629    Subjective Patient reports her neck is sore and sleeping is difficulty because of pain in shoulder and left hip and she has to use CPAP machine. She feels she is improving and getting better as she heals. She is doing pendulums at home and is trying to obey precautions.    Patient Stated Goals Patient would like to be able to use her right shoulder/arm for return to prior level of function with playing banjo, play tennis and volleyball   Currently in Pain? Yes   Pain Score 3    Pain Location Shoulder   Pain Orientation Right   Pain  Descriptors / Indicators Aching   Pain Type Surgical pain   Pain Onset 1 to 4 weeks ago   Pain Frequency Intermittent       Objective: Observation; well healing incision right shoulder, patient wearing sling/pilllow with right UE propped on pillow, posture with + hiking of right shoulder Palpation; + spasms along right upper arm lateral/posterior aspect, forearm and upper trapezius into cervical spine       [facility] Adult PT Treatment/Exercise - 09/09/15 1632    Exercises   Exercises Other Exercises   Other Exercises  PROM right shoulder 90 forward elevation, neurtral ER, IR across trunk , abduction to 45 degrees all with assistance of therapist 2 x 10 reps, scapular adduction in sitting x 5-10 reps with tactile and verbal cuing    Manual Therapy   Manual Therapy Soft tissue mobilization   Soft tissue mobilization cervical spine, right upper trapezius, right upper arm with patient supine lying prior to exercise       Patient response to treatment: improved soft tissue elasticity and decreased spasms noted in upper trapezius and cervical spine, required cuing and assistance to perform PROM/AAROM within protocol guidelines          PT Education - 09/09/15 1700    Education provided Yes   Education Details educated in protocol progression for next  week and precautions to continue for limited movement of right arm as shoulder heals from surgery   Person(s) Educated Patient   Methods Explanation   Comprehension Verbalized understanding             PT Long Term Goals - 08/26/15 1329    PT LONG TERM GOAL #1   Title Patient will demonstrate improved funciton with decreased self perceived disabiltiy as indicated by quick Dash score of 50% impairment by 10/07/2015   Baseline 89% Quick Dash   Status New   PT LONG TERM GOAL #2   Title Patient will demonstrate improved funciton with decreased self perceived disabiltiy as indicated by quick Dash score of 30% impairment by  11/19/2015   Baseline 89% Quick Dash   Status New   PT LONG TERM GOAL #3   Title Patient will have full AROM right shoulder in order to perform personal care and reach overhead with minimal difficulty by 10/07/2015   Baseline limited per protocol guidelines/s/p surgery   Status New   PT LONG TERM GOAL #4   Title Patient will be independent with home program for pain control and home program for improving flexibility and strength in order to contineu with self management/return to prior level of function by 11/19/2015   Baseline limited knowledge of appropriate pain control strategies and progression of exercises to regain full motion/strength right UE s/p surgery within appropriate guidelines   Status New               Plan - 09/09/15 1700    Clinical Impression Statement Patient is progressing well with improving ROM right shoulder within protocol guidelines for SLAP repair x 5 weeks post op. She continues with limitations for functional use of right UE and requires guidance and assistance to perform  exercises.    Pt will benefit from skilled therapeutic intervention in order to improve on the following deficits Decreased strength;Pain;Impaired UE functional use;Decreased range of motion   Rehab Potential Good   PT Frequency 2x / week   PT Duration 12 weeks   PT Treatment/Interventions Therapeutic exercise;Moist Heat;Electrical Stimulation;Cryotherapy;Patient/family education;Manual techniques;Ultrasound   PT Next Visit Plan pain control, manual STM, progress ROM per guidelines of protocol        Problem List There are no active problems to display for this patient.   Jomarie Longs PT 09/09/2015, 10:18 PM  [place] [facility] [address] [place], [address] Phone: [phone]   Fax:  [phone]

## 2015-09-13 ENCOUNTER — Encounter: Payer: Self-pay | Admitting: Physical Therapy

## 2015-09-13 ENCOUNTER — Ambulatory Visit: Payer: 59 | Attending: Surgery | Admitting: Physical Therapy

## 2015-09-13 DIAGNOSIS — R29898 Other symptoms and signs involving the musculoskeletal system: Secondary | ICD-10-CM | POA: Diagnosis present

## 2015-09-13 DIAGNOSIS — M25511 Pain in right shoulder: Secondary | ICD-10-CM

## 2015-09-13 NOTE — Therapy (Signed)
[place] [facility] [address], [place], [address] Phone: [phone]   Fax:  [phone]  Physical Therapy Treatment  Patient Details  Name: Lynn Dennis MRN: [id number] Date of Birth: Apr 23, 1961 Referring Provider:  Corky Mull, MD  Encounter Date: 09/13/2015      PT End of Session - 09/13/15 1100    Visit Number 6   Number of Visits 24   Date for PT Re-Evaluation 11/19/15   PT Start Time 1020   PT Stop Time 1050   PT Time Calculation (min) 30 min   Activity Tolerance Patient tolerated treatment well   Behavior During Therapy [facility] for tasks assessed/performed      Past Medical History  Diagnosis Date  . Sleep apnea     Past Surgical History  Procedure Laterality Date  . Heel spur surgery Right   . Abdominal hysterectomy    . Knee arthroscopy Left   . Laparoscopic gastric banding    . Hernia repair    . Tonsillectomy    . Shoulder arthroscopy with debridement and bicep tendon repair Right 08/12/2015    Procedure: SHOULDER ARTHROSCOPY WITH DEBRIDMENT, DECOMPRESSION, SLAP REPAIR, MINI OPEN BICEPS TENDONESIS;  Surgeon: Corky Mull, MD;  Location: [facility] ORS;  Service: Orthopedics;  Laterality: Right;    There were no vitals filed for this visit.  Visit Diagnosis:  Pain in right shoulder  Weakness of right upper extremity      Subjective Assessment - 09/13/15 1022    Subjective Patient reports she is having aching and catching at close to 90 degrees shoulder elevation in pendulum exercise.     Patient Stated Goals Patient would like to be able to use her right shoulder/arm for return to prior level of function with playing banjo, play tennis and volleyball   Currently in Pain? Yes   Pain Score 4    Pain Location Shoulder   Pain Orientation Right   Pain Descriptors / Indicators Aching   Pain Type Surgical pain   Pain Onset More than a month ago  08/12/2015   Pain Frequency Intermittent   Multiple Pain Sites No         Objective: Observation; well healing incision right shoulder, patient wearing sling/pilllow with right UE propped on pillow, posture with + hiking of right shoulder Palpation; + spasms along right upper arm lateral/posterior aspect, forearm and upper trapezius into cervical spine        [facility] Adult PT Treatment/Exercise - 09/13/15 1025    Exercises   Exercises Other Exercises   Other Exercises  PROM and AAROM right shoulder 90 forward elevation, neurtral ER, IR across trunk , abduction to 45 degrees all with assistance of therapist 2 x 10 reps, scapular adduction in sitting x 5-10 reps with tactile and verbal cuing, isometric exercises performed in supine with manual resistance for ER/IR mild 2 x 5 reps, side lying upper trapezius stretch performed by therapist 3 x 20 seconds, AAROM forward reach in side lying performed and lower trapezius control/exercise with guidance of therapist with tactile and verbal cues x 10 reps, re instructed in proper performance of pendulums with relaxed shoulder to decrease impingement of shoulder; All exercises performed with guidance and cuing of therapist    Manual Therapy   Manual Therapy Soft tissue mobilization   Soft tissue mobilization cervical spine, right upper trapezius. With patient in supine lying and side lying         Patient  response to treatment: improved soft tissue elasticity and decreased spasms noted in upper trapezius and cervical spine, required cuing and assistance to perform PROM/AAROM within protocol guidelines, improved technique with pendulum exercises following demonstration and with tactile/verbal cues of therapist        PT Education - 09/13/15 1100    Education provided Yes   Education Details instructed in AAROM right shoulder/UE in supine lying to 90 degrees (within pain free range).    Person(s) Educated Patient   Methods Explanation;Demonstration;Verbal cues   Comprehension Verbalized  understanding;Returned demonstration;Verbal cues required             PT Long Term Goals - 08/26/15 1329    PT LONG TERM GOAL #1   Title Patient will demonstrate improved funciton with decreased self perceived disabiltiy as indicated by quick Dash score of 50% impairment by 10/07/2015   Baseline 89% Quick Dash   Status New   PT LONG TERM GOAL #2   Title Patient will demonstrate improved funciton with decreased self perceived disabiltiy as indicated by quick Dash score of 30% impairment by 11/19/2015   Baseline 89% Quick Dash   Status New   PT LONG TERM GOAL #3   Title Patient will have full AROM right shoulder in order to perform personal care and reach overhead with minimal difficulty by 10/07/2015   Baseline limited per protocol guidelines/s/p surgery   Status New   PT LONG TERM GOAL #4   Title Patient will be independent with home program for pain control and home program for improving flexibility and strength in order to contineu with self management/return to prior level of function by 11/19/2015   Baseline limited knowledge of appropriate pain control strategies and progression of exercises to regain full motion/strength right UE s/p surgery within appropriate guidelines   Status New               Plan - 09/13/15 1058    Clinical Impression Statement Patient is doing well with decreasing pain and improving ROM right shoulder within protocol guidelines for SLAP repair x 5 weeks post op. She is able to complete exercises with guidance and assistance of therapist to remain within protocol guidelines. Limitations of pain, weakness and ROM limit full functional use of right UE and patient will therefore benefit from continued physical therapy intervention to achieve goals.    Pt will benefit from skilled therapeutic intervention in order to improve on the following deficits Decreased strength;Pain;Impaired UE functional use;Decreased range of motion   Rehab Potential Good    PT Frequency 2x / week   PT Duration 12 weeks   PT Treatment/Interventions Therapeutic exercise;Moist Heat;Electrical Stimulation;Cryotherapy;Patient/family education;Manual techniques;Ultrasound   PT Next Visit Plan pain control, manual STM, progress ROM per guidelines of protocol        Problem List There are no active problems to display for this patient.   Jomarie Longs PT 09/13/2015, 5:19 PM  [place] [facility] [address] [place], [address] Phone: [phone]   Fax:  [phone]

## 2015-09-17 ENCOUNTER — Ambulatory Visit: Payer: 59 | Admitting: Physical Therapy

## 2015-09-17 ENCOUNTER — Encounter: Payer: Self-pay | Admitting: Physical Therapy

## 2015-09-17 DIAGNOSIS — M25511 Pain in right shoulder: Secondary | ICD-10-CM

## 2015-09-17 DIAGNOSIS — R29898 Other symptoms and signs involving the musculoskeletal system: Secondary | ICD-10-CM

## 2015-09-17 NOTE — Therapy (Signed)
[place] [facility] [address] [place], [address] Phone: [phone]   Fax:  [phone]  Physical Therapy Treatment  Patient Details  Name: Lynn Dennis MRN: [id number] Date of Birth: Apr 04, 1961 Referring Provider:  Corky Mull, MD  Encounter Date: 09/17/2015      PT End of Session - 09/17/15 1102    Visit Number 7   Number of Visits 24   Date for PT Re-Evaluation 11/19/15   PT Start Time 1050   PT Stop Time 1130   PT Time Calculation (min) 40 min   Activity Tolerance Patient tolerated treatment well   Behavior During Therapy [facility] for tasks assessed/performed      Past Medical History  Diagnosis Date  . Sleep apnea     Past Surgical History  Procedure Laterality Date  . Heel spur surgery Right   . Abdominal hysterectomy    . Knee arthroscopy Left   . Laparoscopic gastric banding    . Hernia repair    . Tonsillectomy    . Shoulder arthroscopy with debridement and bicep tendon repair Right 08/12/2015    Procedure: SHOULDER ARTHROSCOPY WITH DEBRIDMENT, DECOMPRESSION, SLAP REPAIR, MINI OPEN BICEPS TENDONESIS;  Surgeon: Corky Mull, MD;  Location: [facility] ORS;  Service: Orthopedics;  Laterality: Right;    There were no vitals filed for this visit.  Visit Diagnosis:  Pain in right shoulder  Weakness of right upper extremity      Subjective Assessment - 09/17/15 1059    Subjective Patient reports increased soreness in right upper arm to elbow. She reports she may be doing more than she should at home.    Patient Stated Goals Patient would like to be able to use her right shoulder/arm for return to prior level of function with playing banjo, play tennis and volleyball   Currently in Pain? Yes   Pain Score 5    Pain Location Shoulder   Pain Orientation Right   Pain Descriptors / Indicators Aching   Pain Type Surgical pain   Pain Onset Other (comment)  surgery 08/12/2015   Pain Frequency  Intermittent   Multiple Pain Sites No      Objective: Observation/palpation: increased tension/swelling noted lateral upper arm right UE and upper trapezius muscle and along medial border of scapula      [facility] Adult PT Treatment/Exercise - 09/17/15 1101    Exercises   Exercises Other Exercises   Other Exercises  PROM and AAROM right shoulder 90 forward elevation, neurtral ER, IR across trunk , abduction to 45 degrees all with assistance of therapist 2 x 10 reps, scapular adduction in sitting x 5-10 reps with tactile and verbal cuing, isometric exercises performed in supine with manual resistance for ER/IR mild 2 x 5 reps,  All exercises performed with guidance and cuing of therapist    Manual Therapy   Manual Therapy Soft tissue mobilization   Soft tissue mobilization cervical spine, right upper trapezius. And right upper arm to decrease spasms and tenderness to allow exercises to be performed with less difficulty and pain     Electrical stimulation: High volt applied (2) electrodes to right shoulder upper trapezius and posterior aspect of right shoulder and Russian stim 10/10 cycle applied (2) electrodes to right medial border of scapula and lower trapezis to facilitate contraction and motor control with instruction to patient to engage muscles during 10 second cycle  Patient response to treatment : significant decreased in spasms note right  upper trapeizus and imrpoved contraciotn/motor control of peri scapular muscles following estim. Patient with catching at end range forward elevation in supine prior to estim. Treatment, she required assistance and verbal cuing to complete exercises for ROM; forward elevation to >90 and ER to 30, pain level decreased from 5 to 2/10 following estim.            PT Education - 09/17/15 1126    Education provided Yes   Education Details instructed in use of electrical stim. for reduction of spasms and musle facilitation   Person(s) Educated Patient    Methods Explanation   Comprehension Verbalized understanding             PT Long Term Goals - 08/26/15 1329    PT LONG TERM GOAL #1   Title Patient will demonstrate improved funciton with decreased self perceived disabiltiy as indicated by quick Dash score of 50% impairment by 10/07/2015   Baseline 89% Quick Dash   Status New   PT LONG TERM GOAL #2   Title Patient will demonstrate improved funciton with decreased self perceived disabiltiy as indicated by quick Dash score of 30% impairment by 11/19/2015   Baseline 89% Quick Dash   Status New   PT LONG TERM GOAL #3   Title Patient will have full AROM right shoulder in order to perform personal care and reach overhead with minimal difficulty by 10/07/2015   Baseline limited per protocol guidelines/s/p surgery   Status New   PT LONG TERM GOAL #4   Title Patient will be independent with home program for pain control and home program for improving flexibility and strength in order to contineu with self management/return to prior level of function by 11/19/2015   Baseline limited knowledge of appropriate pain control strategies and progression of exercises to regain full motion/strength right UE s/p surgery within appropriate guidelines   Status New               Plan - 09/17/15 1140    Clinical Impression Statement Patient demonstrated decreased spasms and improved pain in right shoulder. She is progressing steadily and slowly with pain as limiting factor.    Pt will benefit from skilled therapeutic intervention in order to improve on the following deficits Decreased strength;Pain;Impaired UE functional use;Decreased range of motion   Rehab Potential Good   PT Frequency 2x / week   PT Duration 12 weeks   PT Treatment/Interventions Therapeutic exercise;Moist Heat;Electrical Stimulation;Cryotherapy;Patient/family education;Manual techniques;Ultrasound   PT Next Visit Plan pain control, manual STM, progress ROM per guidelines of  protocol        Problem List There are no active problems to display for this patient.   Jomarie Longs PT 09/18/2015, 4:01 PM  [place] [facility] [facility] [address] [place], [address] Phone: [phone]   Fax:  [phone]

## 2015-09-20 ENCOUNTER — Encounter: Payer: 59 | Admitting: Physical Therapy

## 2015-09-22 ENCOUNTER — Encounter: Payer: Self-pay | Admitting: Physical Therapy

## 2015-09-22 ENCOUNTER — Ambulatory Visit: Payer: 59 | Admitting: Physical Therapy

## 2015-09-22 DIAGNOSIS — M25511 Pain in right shoulder: Secondary | ICD-10-CM

## 2015-09-22 DIAGNOSIS — R29898 Other symptoms and signs involving the musculoskeletal system: Secondary | ICD-10-CM

## 2015-09-22 NOTE — Therapy (Signed)
[place] [facility] [address], [place], [address] Phone: [phone]   Fax:  [phone]  Physical Therapy Treatment  Patient Details  Name: Lynn Dennis MRN: [id number] Date of Birth: [birth year] Referring Provider:  Corky Mull, MD  Encounter Date: 09/22/2015      PT End of Session - 09/22/15 1106    Visit Number 8   Number of Visits 24   Date for PT Re-Evaluation 11/19/15   PT Start Time 1019   PT Stop Time 1105   PT Time Calculation (min) 46 min   Activity Tolerance Patient tolerated treatment well   Behavior During Therapy [facility] for tasks assessed/performed      Past Medical History  Diagnosis Date  . Sleep apnea     Past Surgical History  Procedure Laterality Date  . Heel spur surgery Right   . Abdominal hysterectomy    . Knee arthroscopy Left   . Laparoscopic gastric banding    . Hernia repair    . Tonsillectomy    . Shoulder arthroscopy with debridement and bicep tendon repair Right 08/12/2015    Procedure: SHOULDER ARTHROSCOPY WITH DEBRIDMENT, DECOMPRESSION, SLAP REPAIR, MINI OPEN BICEPS TENDONESIS;  Surgeon: Corky Mull, MD;  Location: [facility] ORS;  Service: Orthopedics;  Laterality: Right;    There were no vitals filed for this visit.  Visit Diagnosis:  Pain in right shoulder  Weakness of right upper extremity      Subjective Assessment - 09/22/15 1100    Subjective Patient reports she felt much better following estim.  last session. She continues to see improvement weekly.     Patient Stated Goals Patient would like to be able to use her right shoulder/arm for return to prior level of function with playing banjo, play tennis and volleyball   Currently in Pain? Yes   Pain Score 2    Pain Location Shoulder   Pain Orientation Right   Pain Descriptors / Indicators Aching   Pain Type Surgical pain   Pain Onset Other (comment)  s/p surgery 08/12/2015   Pain Frequency Intermittent   Multiple Pain Sites No      Objective: AAROM: right shoulder 0-120, ER and abduction within protocol guidelines       [facility] Adult PT Treatment/Exercise - 09/22/15 1106    Exercises   Exercises Other Exercises   Other Exercises  P/AAROM right shoulder 90 forward elevation, neurtral ER, IR across trunk , abduction to 45 degrees all with assistance of therapist 2 x 10 reps scapular adduction in sitting x 5-10 reps with tactile and verbal cuing, isometric exercises performed in supine with manual resistance for ER/IR mild 2 x 5 reps, All exercises performed with guidance and cuing of therapist    Manual Therapy   Manual Therapy Soft tissue mobilization   Soft tissue mobilization  cervical spine, right upper trapezius. And right upper arm to decrease spasms and tenderness to allow exercises to be performed with less difficulty and pain       Electrical stimulation: High volt applied (2) electrodes to right shoulder upper trapezius and posterior aspect of right shoulder and Russian stim 10/10 cycle applied (2) electrodes to right medial border of scapula and lower trapezis to facilitate contraction and motor control with instruction to patient to engage muscles during 10 second cycle  Patient response to treatment : significant decreased in spasms note right upper trapeizus and imrpoved contraciotn/motor control of peri scapular muscles following estim.  Patient with catching at end range forward elevation in supine prior to estim. treatment, she required assistance and verbal cuing to complete exercises for ROM; forward elevation to >90 and ER to 30, no increased pain reported with treatment          PT Education - 09/22/15 1100    Education provided Yes   Education Details re inforced home program of exercises and progression of exercises per protocol   Person(s) Educated Patient   Methods Explanation   Comprehension Verbalized understanding             PT Long Term Goals -  08/26/15 1329    PT LONG TERM GOAL #1   Title Patient will demonstrate improved funciton with decreased self perceived disabiltiy as indicated by quick Dash score of 50% impairment by 10/07/2015   Baseline 89% Quick Dash   Status New   PT LONG TERM GOAL #2   Title Patient will demonstrate improved funciton with decreased self perceived disabiltiy as indicated by quick Dash score of 30% impairment by 11/19/2015   Baseline 89% Quick Dash   Status New   PT LONG TERM GOAL #3   Title Patient will have full AROM right shoulder in order to perform personal care and reach overhead with minimal difficulty by 10/07/2015   Baseline limited per protocol guidelines/s/p surgery   Status New   PT LONG TERM GOAL #4   Title Patient will be independent with home program for pain control and home program for improving flexibility and strength in order to contineu with self management/return to prior level of function by 11/19/2015   Baseline limited knowledge of appropriate pain control strategies and progression of exercises to regain full motion/strength right UE s/p surgery within appropriate guidelines   Status New               Plan - 09/22/15 1107    Clinical Impression Statement Progressing well with all goals and with guidance through protocol for SLAP repair. Begining AAROM, isometric exercises without difficulty.    Pt will benefit from skilled therapeutic intervention in order to improve on the following deficits Decreased strength;Pain;Impaired UE functional use;Decreased range of motion   Rehab Potential Good   PT Frequency 2x / week   PT Duration 12 weeks   PT Treatment/Interventions Therapeutic exercise;Moist Heat;Electrical Stimulation;Cryotherapy;Patient/family education;Manual techniques;Ultrasound   PT Next Visit Plan pain control, manual STM, progress ROM per guidelines of protocol        Problem List There are no active problems to display for this patient.   Jomarie Longs PT 09/22/2015, 7:36 PM  [place] [facility] [address], [place], [address] Phone: [phone]   Fax:  [phone]

## 2015-09-24 ENCOUNTER — Ambulatory Visit: Payer: 59 | Admitting: Physical Therapy

## 2015-09-27 ENCOUNTER — Encounter: Payer: 59 | Admitting: Physical Therapy

## 2015-09-29 ENCOUNTER — Ambulatory Visit: Payer: 59 | Admitting: Physical Therapy

## 2015-09-29 ENCOUNTER — Encounter: Payer: Self-pay | Admitting: Physical Therapy

## 2015-09-29 DIAGNOSIS — M25511 Pain in right shoulder: Secondary | ICD-10-CM

## 2015-09-29 DIAGNOSIS — R29898 Other symptoms and signs involving the musculoskeletal system: Secondary | ICD-10-CM

## 2015-09-29 NOTE — Therapy (Signed)
[place] [facility] [address], [place], [address] Phone: [phone]   Fax:  [phone]  Physical Therapy Treatment  Patient Details  Name: Lynn Dennis MRN: [id number] Date of Birth: 1960-12-29 No Data Recorded  Encounter Date: 09/29/2015      PT End of Session - 09/29/15 1159    Visit Number 9   Number of Visits 24   Date for PT Re-Evaluation 11/19/15   PT Start Time 1020   PT Stop Time 1105   PT Time Calculation (min) 45 min   Activity Tolerance Patient tolerated treatment well   Behavior During Therapy [facility] for tasks assessed/performed      Past Medical History  Diagnosis Date  . Sleep apnea     Past Surgical History  Procedure Laterality Date  . Heel spur surgery Right   . Abdominal hysterectomy    . Knee arthroscopy Left   . Laparoscopic gastric banding    . Hernia repair    . Tonsillectomy    . Shoulder arthroscopy with debridement and bicep tendon repair Right 08/12/2015    Procedure: SHOULDER ARTHROSCOPY WITH DEBRIDMENT, DECOMPRESSION, SLAP REPAIR, MINI OPEN BICEPS TENDONESIS;  Surgeon: Corky Mull, MD;  Location: [facility] ORS;  Service: Orthopedics;  Laterality: Right;    There were no vitals filed for this visit.  Visit Diagnosis:  Pain in right shoulder  Weakness of right upper extremity      Subjective Assessment - 09/29/15 1022    Subjective Patient reports she continues with catching in right shoulder with elevation. She is overall better and feeling stronger.    Patient Stated Goals Patient would like to be able to use her right shoulder/arm for return to prior level of function with playing banjo, play tennis and volleyball   Currently in Pain? Yes             [place] Adult PT Treatment/Exercise - 09/29/15 1158    Exercises   Exercises Other Exercises   Other Exercises  P/AAROM right shoulder 90 forward elevation, neutral ER, IR across trunk, abduction to 45 degrees all  with assistance of therapist 2 x 10 reps scapular adduction in sitting x 5-10 reps with tactile and verbal cuing, isometric exercises performed in supine with manual resistance for ER/IR mild 2 x 5 reps, All exercises performed with guidance and cuing of therapist     Manual Therapy   Manual Therapy Soft tissue mobilization   Soft tissue mobilization cervical spine, right upper trapezius and right upper arm to decrease spasms and tenderness to allow exercises to be performed with less difficulty and pain        Electrical stimulation: High volt applied (2) electrodes to right shoulder upper trapezius and posterior aspect of right shoulder and Russian stim 10/10 cycle applied (2) electrodes to right medial border of scapula and lower trapezis to facilitate contraction and motor control with instruction to patient to engage muscles during 10 second cycle  Patient response to treatment : significant decreased in spasms right upper trapeizus with  improved contraciotn/motor control of scapular muscles following estim. Patient continues with intermittent catching at end range forward elevation in supine prior to estim., she required assistance and verbal cuing to complete exercises for ROM; forward elevation to >90 and ER to 30, no increased pain reported with treatment              PT Long Term Goals - 08/26/15 1329    PT  LONG TERM GOAL #1   Title Patient will demonstrate improved funciton with decreased self perceived disabiltiy as indicated by quick Dash score of 50% impairment by 10/07/2015   Baseline 89% Quick Dash   Status New   PT LONG TERM GOAL #2   Title Patient will demonstrate improved funciton with decreased self perceived disabiltiy as indicated by quick Dash score of 30% impairment by 11/19/2015   Baseline 89% Quick Dash   Status New   PT LONG TERM GOAL #3   Title Patient will have full AROM right shoulder in order to perform personal care and reach overhead with minimal  difficulty by 10/07/2015   Baseline limited per protocol guidelines/s/p surgery   Status New   PT LONG TERM GOAL #4   Title Patient will be independent with home program for pain control and home program for improving flexibility and strength in order to contineu with self management/return to prior level of function by 11/19/2015   Baseline limited knowledge of appropriate pain control strategies and progression of exercises to regain full motion/strength right UE s/p surgery within appropriate guidelines   Status New               Plan - 09/29/15 1159    Clinical Impression Statement Progressing well with goals and with guidance through protocol for SLAP repair.   Rehab Potential Good   PT Frequency 2x / week   PT Duration 12 weeks   PT Treatment/Interventions Therapeutic exercise;Moist Heat;Electrical Stimulation;Cryotherapy;Patient/family education;Manual techniques;Ultrasound   PT Next Visit Plan pain control, manual STM, progress ROM per guidelines of protocol        Problem List There are no active problems to display for this patient.   Jomarie Longs PT 09/29/2015, 12:01 PM  [place] [facility] [address], [place], [address] Phone: [phone]   Fax:  [phone]  Name: Lynn Dennis MRN: [id number] Date of Birth: Aug 28, 1961

## 2015-10-01 ENCOUNTER — Ambulatory Visit: Payer: 59 | Admitting: Physical Therapy

## 2015-10-01 ENCOUNTER — Encounter: Payer: Self-pay | Admitting: Physical Therapy

## 2015-10-01 ENCOUNTER — Ambulatory Visit: Payer: 59 | Attending: Surgery | Admitting: Physical Therapy

## 2015-10-01 DIAGNOSIS — R29898 Other symptoms and signs involving the musculoskeletal system: Secondary | ICD-10-CM | POA: Diagnosis present

## 2015-10-01 DIAGNOSIS — M25511 Pain in right shoulder: Secondary | ICD-10-CM | POA: Diagnosis not present

## 2015-10-01 NOTE — Therapy (Deleted)
[place] [facility] [address], [place], [address] Phone: [phone]   Fax:  [phone]  Physical Therapy Treatment  Patient Details  Name: Lynn Dennis MRN: [id number] Date of Birth: 1961/10/22 No Data Recorded  Encounter Date: 10/01/2015      PT End of Session - 10/01/15 1253    Visit Number 10   Number of Visits 24   Date for PT Re-Evaluation 11/19/15   PT Start Time 1203   Activity Tolerance Patient tolerated treatment well   Behavior During Therapy [facility] for tasks assessed/performed      Past Medical History  Diagnosis Date  . Sleep apnea     Past Surgical History  Procedure Laterality Date  . Heel spur surgery Right   . Abdominal hysterectomy    . Knee arthroscopy Left   . Laparoscopic gastric banding    . Hernia repair    . Tonsillectomy    . Shoulder arthroscopy with debridement and bicep tendon repair Right 08/12/2015    Procedure: SHOULDER ARTHROSCOPY WITH DEBRIDMENT, DECOMPRESSION, SLAP REPAIR, MINI OPEN BICEPS TENDONESIS;  Surgeon: Corky Mull, MD;  Location: [facility] ORS;  Service: Orthopedics;  Laterality: Right;    There were no vitals filed for this visit.  Visit Diagnosis:  Pain in right shoulder  Weakness of right upper extremity      Subjective Assessment - 10/01/15 1210    Subjective Patient reports she has not been doing exercises that cause catching in her shoulder. She reports she continues with catching and spasms in upper trapezius muscles.    Patient Stated Goals Patient would like to be able to use her right shoulder/arm for return to prior level of function with playing banjo, play tennis and volleyball   Currently in Pain? Yes   Pain Score 2   ranges from 2/10 up to 10/10 with catching/spasms in upper trapezius   Pain Location Shoulder   Pain Orientation Right   Pain Descriptors / Indicators Aching   Pain Type Surgical pain   Pain Onset --  surgery 08/12/2015   Pain Frequency Intermittent   Multiple Pain Sites No                              PT Education - 10/01/15 1215    Education provided Yes   Education Details AAROM with hand on table, alternate with AAROM in sitting.   Person(s) Educated Patient   Methods Explanation   Comprehension Verbalized understanding             PT Long Term Goals - 08/26/15 1329    PT LONG TERM GOAL #1   Title Patient will demonstrate improved funciton with decreased self perceived disabiltiy as indicated by quick Dash score of 50% impairment by 10/07/2015   Baseline 89% Quick Dash   Status New   PT LONG TERM GOAL #2   Title Patient will demonstrate improved funciton with decreased self perceived disabiltiy as indicated by quick Dash score of 30% impairment by 11/19/2015   Baseline 89% Quick Dash   Status New   PT LONG TERM GOAL #3   Title Patient will have full AROM right shoulder in order to perform personal care and reach overhead with minimal difficulty by 10/07/2015   Baseline limited per protocol guidelines/s/p surgery   Status New   PT LONG TERM GOAL #4   Title Patient will be independent with home  program for pain control and home program for improving flexibility and strength in order to contineu with self management/return to prior level of function by 11/19/2015   Baseline limited knowledge of appropriate pain control strategies and progression of exercises to regain full motion/strength right UE s/p surgery within appropriate guidelines   Status New               Plan - 10/01/15 1253    Clinical Impression Statement Progressing well with goals and with guidance    Pt will benefit from skilled therapeutic intervention in order to improve on the following deficits Decreased strength;Pain;Impaired UE functional use;Decreased range of motion   Rehab Potential Good   PT Frequency 2x / week   PT Duration 12 weeks   PT Treatment/Interventions Therapeutic  exercise;Moist Heat;Electrical Stimulation;Cryotherapy;Patient/family education;Manual techniques;Ultrasound   PT Next Visit Plan pain control, manual STM, progress ROM per guidelines of protocol        Problem List There are no active problems to display for this patient.   Jomarie Longs PT 10/01/2015, 3:00 PM  [place] [facility] [address] [place], [address] Phone: [phone]   Fax:  [phone]  Name: Lynn Dennis MRN: [id number] Date of Birth: 10/29/1961

## 2015-10-01 NOTE — Therapy (Signed)
[place] [facility] [address], [place], [address] Phone: [phone]   Fax:  [phone]  Physical Therapy Treatment  Patient Details  Name: Lynn Dennis MRN: [id number] Date of Birth: [birth year] Referring Provider: Corky Mull, MD  Encounter Date: 10/01/2015      PT End of Session - 10/01/15 1253    Visit Number 10   Number of Visits 24   Date for PT Re-Evaluation 11/19/15   PT Start Time 1203   PT Stop Time 1250   PT Time Calculation (min) 47 min   Activity Tolerance Patient tolerated treatment well   Behavior During Therapy [facility] for tasks assessed/performed      Past Medical History  Diagnosis Date  . Sleep apnea     Past Surgical History  Procedure Laterality Date  . Heel spur surgery Right   . Abdominal hysterectomy    . Knee arthroscopy Left   . Laparoscopic gastric banding    . Hernia repair    . Tonsillectomy    . Shoulder arthroscopy with debridement and bicep tendon repair Right 08/12/2015    Procedure: SHOULDER ARTHROSCOPY WITH DEBRIDMENT, DECOMPRESSION, SLAP REPAIR, MINI OPEN BICEPS TENDONESIS;  Surgeon: Corky Mull, MD;  Location: [facility] ORS;  Service: Orthopedics;  Laterality: Right;    There were no vitals filed for this visit.  Visit Diagnosis:  Pain in right shoulder  Weakness of right upper extremity      Subjective Assessment - 10/01/15 1210    Subjective Patient reports she has not been doing exercises that cause catching in her shoulder. She reports she continues with catching and spasms in upper trapezius muscle.    Patient Stated Goals Patient would like to be able to use her right shoulder/arm for return to prior level of function with playing banjo, play tennis and volleyball   Currently in Pain? Yes   Pain Score 2   ranges from 2/10 up to 10/10 with catching/spasms in upper trapezius   Pain Location Shoulder   Pain Orientation Right   Pain Descriptors / Indicators  Aching   Pain Type Surgical pain   Pain Onset --  surgery 08/12/2015   Pain Frequency Intermittent   Multiple Pain Sites No        Objective: AAROM: right shoulder 0-120, ER and abduction within protocol guidelines Palpation: + spasms along right upper trapezius and cervical spine paraspinals, + tender along right shoulder         [facility] Adult PT Treatment/Exercise - 10/01/15 1459    Exercises   Exercises Other Exercises   Other Exercises  P/AAROM right shoulder 90 forward elevation, neutral ER, IR across trunk, abduction to 45 degrees all with assistance of therapist 2 x 10 reps scapular adduction in sitting x 5-10 reps with tactile and verbal cuing, isometric exercises performed in supine with manual resistance for ER/IR mild 2 x 5 reps, All exercises performed with guidance and cuing of therapist   Manual Therapy   Manual Therapy Soft tissue mobilization   Soft tissue mobilization cervical spine, right upper trapezius and right upper arm to decrease spasms and tenderness to allow exercises to be performed with less difficulty and pain       Electrical stimulation: High volt applied (2) electrodes to right shoulder upper trapezius and posterior aspect of right shoulder and Russian stim 10/10 cycle applied (2) electrodes to right medial border of scapula and lower trapezis to facilitate contraction and motor control  with instruction to patient to engage muscles during 10 second cycle  Patient response to treatment  significant decrease in spasms right upper trapeizus with improved contraciotn/motor control of scapular muscles following estim. Patient continues with intermittent catching at end range forward elevation in supine prior to estim., she required assistance and verbal cuing to complete exercises for ROM; forward elevation to >90 and ER to 30, no increased pain reported with treatment          PT Education - 10/01/15 1215    Education provided Yes   Education  Details AAROM with hand on table, alternate with AAROM in sitting.   Person(s) Educated Patient   Methods Explanation   Comprehension Verbalized understanding             PT Long Term Goals - 08/26/15 1329    PT LONG TERM GOAL #1   Title Patient will demonstrate improved funciton with decreased self perceived disabiltiy as indicated by quick Dash score of 50% impairment by 10/07/2015   Baseline 89% Quick Dash   Status New   PT LONG TERM GOAL #2   Title Patient will demonstrate improved funciton with decreased self perceived disabiltiy as indicated by quick Dash score of 30% impairment by 11/19/2015   Baseline 89% Quick Dash   Status New   PT LONG TERM GOAL #3   Title Patient will have full AROM right shoulder in order to perform personal care and reach overhead with minimal difficulty by 10/07/2015   Baseline limited per protocol guidelines/s/p surgery   Status New   PT LONG TERM GOAL #4   Title Patient will be independent with home program for pain control and home program for improving flexibility and strength in order to contineu with self management/return to prior level of function by 11/19/2015   Baseline limited knowledge of appropriate pain control strategies and progression of exercises to regain full motion/strength right UE s/p surgery within appropriate guidelines   Status New               Plan - 10/01/15 1253    Clinical Impression Statement Progressing well with goals and with guidance of therapist. She continues with weakness and limited function with right UE s/p surgery and will benefit from continued physical therapy intervention.    Pt will benefit from skilled therapeutic intervention in order to improve on the following deficits Decreased strength;Pain;Impaired UE functional use;Decreased range of motion   Rehab Potential Good   PT Frequency 2x / week   PT Duration 12 weeks   PT Treatment/Interventions Therapeutic exercise;Moist Heat;Electrical  Stimulation;Cryotherapy;Patient/family education;Manual techniques;Ultrasound   PT Next Visit Plan pain control, manual STM, progress ROM per guidelines of protocol        Problem List There are no active problems to display for this patient.   Jomarie Longs PT 10/02/2015, 3:08 PM  [place] [facility] [address], [place], [address] Phone: [phone]   Fax:  [phone]  Name: Lynn Dennis MRN: [id number] Date of Birth: 1961/02/05

## 2015-10-04 ENCOUNTER — Encounter: Payer: 59 | Admitting: Physical Therapy

## 2015-10-06 ENCOUNTER — Encounter: Payer: Self-pay | Admitting: Physical Therapy

## 2015-10-06 ENCOUNTER — Ambulatory Visit: Payer: 59 | Admitting: Physical Therapy

## 2015-10-06 DIAGNOSIS — M25511 Pain in right shoulder: Secondary | ICD-10-CM

## 2015-10-06 DIAGNOSIS — R29898 Other symptoms and signs involving the musculoskeletal system: Secondary | ICD-10-CM

## 2015-10-06 NOTE — Therapy (Signed)
[place] [facility] [address], [place], [address] Phone: [phone]   Fax:  [phone]  Physical Therapy Treatment  Patient Details  Name: Lynn Dennis MRN: [id number] Date of Birth: [birth year] Referring Provider: Corky Mull, MD  Encounter Date: 10/06/2015      PT End of Session - 10/06/15 1035    Visit Number 11   Number of Visits 24   Date for PT Re-Evaluation 11/19/15   PT Start Time 1020   PT Stop Time 1110   PT Time Calculation (min) 50 min   Activity Tolerance Patient tolerated treatment well   Behavior During Therapy [facility] for tasks assessed/performed      Past Medical History  Diagnosis Date  . Sleep apnea     Past Surgical History  Procedure Laterality Date  . Heel spur surgery Right   . Abdominal hysterectomy    . Knee arthroscopy Left   . Laparoscopic gastric banding    . Hernia repair    . Tonsillectomy    . Shoulder arthroscopy with debridement and bicep tendon repair Right 08/12/2015    Procedure: SHOULDER ARTHROSCOPY WITH DEBRIDMENT, DECOMPRESSION, SLAP REPAIR, MINI OPEN BICEPS TENDONESIS;  Surgeon: Corky Mull, MD;  Location: [facility] ORS;  Service: Orthopedics;  Laterality: Right;    There were no vitals filed for this visit.  Visit Diagnosis:  Pain in right shoulder  Weakness of right upper extremity      Subjective Assessment - 10/06/15 1030    Subjective Patient reports feeling pain in her shoulder with putting on her socks. Overall she is improving and is more aware of lifting and precautions.    Patient Stated Goals Patient would like to be able to use her right shoulder/arm for return to prior level of function with playing banjo, play tennis and volleyball   Currently in Pain? Yes   Pain Location Shoulder   Pain Orientation Right   Pain Descriptors / Indicators Aching   Pain Type Surgical pain   Pain Onset Other (comment)  08/12/15   Pain Frequency Intermittent             [facility] Adult PT Treatment/Exercise - 10/06/15 2314    Exercises   Exercises Other Exercises   Other Exercises  P/AAROM right shoulder 90 forward elevation, neutral ER, IR across trunk, abduction to 45 degrees all with assistance of therapist 2 x 10 reps scapular adduction in sitting x 5-10 reps with tactile and verbal cuing, isometric exercises performed in supine with manual resistance for ER/IR mild 2 x 5 reps, All exercises performed with guidance and cuing of therapist    Manual Therapy   Manual Therapy Soft tissue mobilization   Soft tissue mobilization cervical spine, right upper trapezius and right upper arm to decrease spasms and tenderness to allow exercises to be performed with less difficulty and pain       Electrical stimulation: High volt applied (2) electrodes to right shoulder upper trapezius and posterior aspect of right shoulder and Russian stim 10/10 cycle applied (2) electrodes to right medial border of scapula and lower trapezis to facilitate contraction and motor control with instruction to patient to engage muscles during 10 second cycle  Patient response to treatment significant decrease in spasms right upper trapeizus with improved contraciotn/motor control of scapular muscles following estim. Patient continues with intermittent catching at end range forward elevation in supine prior to estim., she required assistance and verbal cuing to complete exercises  for ROM; forward elevation to >90 and ER to 30, no increased pain reported with treatment          PT Education - 10/06/15 1045    Education provided Yes   Education Details instructed in AAROM and isometric exercises for right shoulder with proper alingment of shoulder   Person(s) Educated Patient   Methods Explanation;Demonstration;Verbal cues;Tactile cues   Comprehension Verbalized understanding;Returned demonstration;Verbal cues required             PT Long Term Goals - 08/26/15 1329     PT LONG TERM GOAL #1   Title Patient will demonstrate improved funciton with decreased self perceived disabiltiy as indicated by quick Dash score of 50% impairment by 10/07/2015   Baseline 89% Quick Dash   Status New   PT LONG TERM GOAL #2   Title Patient will demonstrate improved funciton with decreased self perceived disabiltiy as indicated by quick Dash score of 30% impairment by 11/19/2015   Baseline 89% Quick Dash   Status New   PT LONG TERM GOAL #3   Title Patient will have full AROM right shoulder in order to perform personal care and reach overhead with minimal difficulty by 10/07/2015   Baseline limited per protocol guidelines/s/p surgery   Status New   PT LONG TERM GOAL #4   Title Patient will be independent with home program for pain control and home program for improving flexibility and strength in order to contineu with self management/return to prior level of function by 11/19/2015   Baseline limited knowledge of appropriate pain control strategies and progression of exercises to regain full motion/strength right UE s/p surgery within appropriate guidelines   Status New               Plan - 10/06/15 1115    Clinical Impression Statement Patient is progressing well towards goals with current physical therapy interventions. She continues wiith limitations of pain and limited knowledge of appropriate exercise progression and will therefore benefit from continued physical therapy intervention.    Pt will benefit from skilled therapeutic intervention in order to improve on the following deficits Decreased strength;Pain;Impaired UE functional use;Decreased range of motion   Rehab Potential Good   PT Frequency 2x / week   PT Duration 12 weeks   PT Treatment/Interventions Therapeutic exercise;Moist Heat;Electrical Stimulation;Cryotherapy;Patient/family education;Manual techniques;Ultrasound   PT Next Visit Plan pain control, manual STM, progress ROM per guidelines of  protocol        Problem List There are no active problems to display for this patient.   Jomarie Longs PT 10/07/2015, 3:23 PM  [place] [facility] [address], [place], [address] Phone: [phone]   Fax:  [phone]  Name: Lynn Dennis MRN: [id number] Date of Birth: 1961-06-06

## 2015-10-08 ENCOUNTER — Encounter: Payer: Self-pay | Admitting: Physical Therapy

## 2015-10-08 ENCOUNTER — Ambulatory Visit: Payer: 59 | Admitting: Physical Therapy

## 2015-10-08 DIAGNOSIS — M25511 Pain in right shoulder: Secondary | ICD-10-CM

## 2015-10-08 DIAGNOSIS — R29898 Other symptoms and signs involving the musculoskeletal system: Secondary | ICD-10-CM

## 2015-10-08 NOTE — Therapy (Signed)
[facility] [address] [place], [address] Phone: [phone]   Fax:  [phone]  Physical Therapy Treatment  Patient Details  Name: Lynn Dennis MRN: [id number] Date of Birth: 1961-08-30 Referring Provider: Corky Mull, MD  Encounter Date: 10/08/2015      PT End of Session - 10/08/15 1137    Visit Number 12   Number of Visits 24   Date for PT Re-Evaluation 11/19/15   PT Start Time 1030   PT Stop Time 1116   PT Time Calculation (min) 46 min   Activity Tolerance Patient tolerated treatment well   Behavior During Therapy [facility] for tasks assessed/performed      Past Medical History  Diagnosis Date  . Sleep apnea     Past Surgical History  Procedure Laterality Date  . Heel spur surgery Right   . Abdominal hysterectomy    . Knee arthroscopy Left   . Laparoscopic gastric banding    . Hernia repair    . Tonsillectomy    . Shoulder arthroscopy with debridement and bicep tendon repair Right 08/12/2015    Procedure: SHOULDER ARTHROSCOPY WITH DEBRIDMENT, DECOMPRESSION, SLAP REPAIR, MINI OPEN BICEPS TENDONESIS;  Surgeon: Corky Mull, MD;  Location: [facility] ORS;  Service: Orthopedics;  Laterality: Right;    There were no vitals filed for this visit.  Visit Diagnosis:  Pain in right shoulder  Weakness of right upper extremity      Subjective Assessment - 10/08/15 1031    Subjective Patient reports feeling pain in her shoulder with putting on her socks.    Patient Stated Goals Patient would like to be able to use her right shoulder/arm for return to prior level of function with playing banjo, play tennis and volleyball   Currently in Pain? Yes   Pain Score 2   better with being careful and pain ranges up 5-6/10    Pain Location Shoulder   Pain Orientation Right   Pain Descriptors / Indicators Aching   Pain Type Surgical pain   Pain Onset --  surgery 08/12/2015   Pain Frequency Intermittent       Objective:  Palpation: + spasms palpable along upper arm right UE AAROM: right shoulder ER >45 degrees in plane of scapula and at 90 degrees abduction Strength: decreased ER with isometric exercises QuickDash: 48%       [facility] Adult PT Treatment/Exercise - 10/08/15 1034    Exercises   Exercises Other Exercises   Other Exercises   P/AAROM right shoulder 90 forward elevation, neutral ER, IR across trunk, abduction to 45 degrees all with assistance of therapist 2 x 10 reps  isometric exercises performed in sitting with manual resistance for ER/IR  and forward flexion and extension  x 5 reps, All exercises performed with guidance and cuing of therapist   Manual Therapy   Manual Therapy Soft tissue mobilization   Soft tissue mobilization Right upper arm/biceps anterolateral aspect of upper arm with patient supine lying followed by exercises.        Electrical stimulation: High volt applied (2) electrodes to right upper arm over spasms and posterior aspect of right shoulder and Russian stim 10/10 cycle applied (2) electrodes to right medial border of scapula and lower trapezis to facilitate contraction and motor control with instruction to patient to engage muscles during 10 second cycle  Patient response to treatment significant decrease in spasms right upper arm with improved contraciotn/motor control of scapular muscles following  estim.  she required assistance and verbal cuing to complete exercises with appropriate intensity for isometrics        PT Long Term Goals - 10/08/15 1138    PT LONG TERM GOAL #1   Title Patient will demonstrate improved funciton with decreased self perceived disabiltiy as indicated by quick Dash score of 50% impairment by 10/07/2015   Baseline 89% Quick Dash   Status Achieved   PT LONG TERM GOAL #2   Title Patient will demonstrate improved funciton with decreased self perceived disabiltiy as indicated by quick Dash score of 30% impairment by 11/19/2015    Baseline 89% Quick Dash   Status On-going   PT LONG TERM GOAL #3   Title Patient will have full AROM right shoulder in order to perform personal care and reach overhead with minimal difficulty by 10/07/2015   Baseline limited per protocol guidelines/s/p surgery   Status Partially Met   PT LONG TERM GOAL #4   Title Patient will be independent with home program for pain control and home program for improving flexibility and strength in order to contineu with self management/return to prior level of function by 11/19/2015   Baseline limited knowledge of appropriate pain control strategies and progression of exercises to regain full motion/strength right UE s/p surgery within appropriate guidelines   Status On-going               Plan - 10/08/15 1142    Clinical Impression Statement Patient is progressing well with goals #1 achieved, other goals partially met or ongoing at this time. She has full ROM in right shoulder and conitnues to be limited per protocol for SLAP Repair with biceps tenodesis.    Pt will benefit from skilled therapeutic intervention in order to improve on the following deficits Decreased strength;Pain;Impaired UE functional use;Decreased range of motion   Rehab Potential Good   PT Frequency 2x / week   PT Duration 12 weeks   PT Treatment/Interventions Therapeutic exercise;Moist Heat;Electrical Stimulation;Cryotherapy;Patient/family education;Manual techniques;Ultrasound   PT Next Visit Plan pain control, manual STM, progress ROM per guidelines of protocol   PT Home Exercise Plan add isometric exercises in standing at wall per handout        Problem List There are no active problems to display for this patient.   Lynn Dennis PT 10/08/2015, 11:45 AM  [facility] [address] [place], [address] Phone: [phone]   Fax:  [phone]  Name: Lynn Dennis MRN: [id number] Date of  Birth: 24-Dec-1960

## 2015-10-11 ENCOUNTER — Encounter: Payer: 59 | Admitting: Physical Therapy

## 2015-10-15 ENCOUNTER — Ambulatory Visit: Payer: 59 | Admitting: Physical Therapy

## 2015-10-18 ENCOUNTER — Encounter: Payer: 59 | Admitting: Physical Therapy

## 2015-10-22 ENCOUNTER — Encounter: Payer: 59 | Admitting: Physical Therapy

## 2015-10-27 ENCOUNTER — Ambulatory Visit: Payer: 59 | Admitting: Physical Therapy

## 2015-10-29 ENCOUNTER — Encounter: Payer: 59 | Admitting: Physical Therapy

## 2015-11-01 ENCOUNTER — Encounter: Payer: 59 | Admitting: Physical Therapy

## 2015-11-03 ENCOUNTER — Ambulatory Visit: Payer: 59 | Admitting: Physical Therapy

## 2015-11-05 ENCOUNTER — Encounter: Payer: 59 | Admitting: Physical Therapy

## 2015-11-10 ENCOUNTER — Encounter: Payer: 59 | Admitting: Physical Therapy

## 2015-11-12 ENCOUNTER — Encounter: Payer: 59 | Admitting: Physical Therapy

## 2015-11-17 ENCOUNTER — Encounter: Payer: 59 | Admitting: Physical Therapy

## 2015-11-19 ENCOUNTER — Encounter: Payer: 59 | Admitting: Physical Therapy

## 2017-01-29 DIAGNOSIS — N390 Urinary tract infection, site not specified: Secondary | ICD-10-CM | POA: Diagnosis not present

## 2017-02-18 DIAGNOSIS — N390 Urinary tract infection, site not specified: Secondary | ICD-10-CM | POA: Diagnosis not present

## 2017-02-26 DIAGNOSIS — N952 Postmenopausal atrophic vaginitis: Secondary | ICD-10-CM | POA: Diagnosis not present

## 2017-02-26 DIAGNOSIS — R300 Dysuria: Secondary | ICD-10-CM | POA: Diagnosis not present

## 2017-03-07 DIAGNOSIS — R399 Unspecified symptoms and signs involving the genitourinary system: Secondary | ICD-10-CM | POA: Diagnosis not present

## 2017-03-14 DIAGNOSIS — J209 Acute bronchitis, unspecified: Secondary | ICD-10-CM | POA: Diagnosis not present

## 2017-03-14 DIAGNOSIS — G4733 Obstructive sleep apnea (adult) (pediatric): Secondary | ICD-10-CM | POA: Diagnosis not present

## 2017-03-14 DIAGNOSIS — R05 Cough: Secondary | ICD-10-CM | POA: Diagnosis not present

## 2017-04-27 DIAGNOSIS — Z1211 Encounter for screening for malignant neoplasm of colon: Secondary | ICD-10-CM | POA: Diagnosis not present

## 2017-04-27 DIAGNOSIS — Z1231 Encounter for screening mammogram for malignant neoplasm of breast: Secondary | ICD-10-CM | POA: Diagnosis not present

## 2017-04-27 DIAGNOSIS — Z01419 Encounter for gynecological examination (general) (routine) without abnormal findings: Secondary | ICD-10-CM | POA: Diagnosis not present

## 2017-05-10 DIAGNOSIS — R300 Dysuria: Secondary | ICD-10-CM | POA: Diagnosis not present

## 2017-05-10 DIAGNOSIS — R102 Pelvic and perineal pain: Secondary | ICD-10-CM | POA: Diagnosis not present

## 2017-12-27 ENCOUNTER — Other Ambulatory Visit: Payer: Self-pay | Admitting: Obstetrics & Gynecology

## 2017-12-27 DIAGNOSIS — Z1231 Encounter for screening mammogram for malignant neoplasm of breast: Secondary | ICD-10-CM

## 2018-01-14 DIAGNOSIS — N3010 Interstitial cystitis (chronic) without hematuria: Secondary | ICD-10-CM | POA: Diagnosis not present

## 2018-01-17 ENCOUNTER — Other Ambulatory Visit: Payer: Self-pay | Admitting: Obstetrics & Gynecology

## 2018-01-17 ENCOUNTER — Ambulatory Visit
Admission: RE | Admit: 2018-01-17 | Discharge: 2018-01-17 | Disposition: A | Discharge status: Home / Self Care | Payer: 59 | Source: Ambulatory Visit | Attending: Obstetrics & Gynecology | Admitting: Obstetrics & Gynecology

## 2018-01-17 DIAGNOSIS — Z1231 Encounter for screening mammogram for malignant neoplasm of breast: Secondary | ICD-10-CM

## 2018-01-17 DIAGNOSIS — N6310 Unspecified lump in the right breast, unspecified quadrant: Secondary | ICD-10-CM

## 2018-01-23 ENCOUNTER — Other Ambulatory Visit: Payer: Self-pay | Admitting: Obstetrics & Gynecology

## 2018-01-23 DIAGNOSIS — Z1239 Encounter for other screening for malignant neoplasm of breast: Secondary | ICD-10-CM

## 2018-01-24 ENCOUNTER — Ambulatory Visit
Admission: RE | Admit: 2018-01-24 | Discharge: 2018-01-24 | Disposition: A | Discharge status: Home / Self Care | Payer: 59 | Source: Ambulatory Visit | Attending: Obstetrics & Gynecology | Admitting: Obstetrics & Gynecology

## 2018-01-24 DIAGNOSIS — Z1231 Encounter for screening mammogram for malignant neoplasm of breast: Secondary | ICD-10-CM | POA: Insufficient documentation

## 2018-01-24 DIAGNOSIS — Z1239 Encounter for other screening for malignant neoplasm of breast: Secondary | ICD-10-CM

## 2018-01-25 DIAGNOSIS — Z85828 Personal history of other malignant neoplasm of skin: Secondary | ICD-10-CM | POA: Diagnosis not present

## 2018-02-04 DIAGNOSIS — M722 Plantar fascial fibromatosis: Secondary | ICD-10-CM | POA: Diagnosis not present

## 2018-02-04 DIAGNOSIS — M79672 Pain in left foot: Secondary | ICD-10-CM | POA: Diagnosis not present

## 2018-02-04 DIAGNOSIS — G5761 Lesion of plantar nerve, right lower limb: Secondary | ICD-10-CM | POA: Diagnosis not present

## 2018-02-14 ENCOUNTER — Encounter: Payer: Self-pay | Admitting: Urology

## 2018-02-14 ENCOUNTER — Ambulatory Visit: Payer: 59 | Admitting: Urology

## 2018-02-14 VITALS — BP 138/82 | HR 66 | Ht 63.0 in | Wt 218.5 lb

## 2018-02-14 DIAGNOSIS — N3010 Interstitial cystitis (chronic) without hematuria: Secondary | ICD-10-CM | POA: Diagnosis not present

## 2018-02-14 MED ORDER — MIRABEGRON ER 25 MG PO TB24: 25.0000 mg | ORAL_TABLET | Freq: Every day | ORAL | 11 refills | Status: DC

## 2018-02-14 NOTE — Progress Notes (Signed)
02/14/2018 1:46 PM   Lynn Dennis 12/25/1960 [id number]  Referring provider: Derinda Late, MD [phone] [facility] [place], [place] [address]  Chief Complaint  Patient presents with  . New Patient (Initial Visit)    IC    HPI: The patient is a 57 year old female who presents today to discuss her urinary symptoms.  She has been told that she may have interstitial cystitis.  She has not previously seen a urologist.  She has been having significant problems with urinary frequency, dysuria, and suprapubic pressure for the last year.  She has had multiple negative urine cultures.  This is all new within the last year or so.  She did not have this problem prior.  She denies any hematuria.  She does not have a history of recurrent UTI.  She has had a hysterectomy but no other genitourinary surgery.  Her symptoms are worsened by certain foods such as acidic foods and coffee.  She has bought a book on interstitial cystitis foods to avoid which avoiding these foods does help.  Her frequency is every 30 minutes.  This is affecting her quality of life.  She is currently on vaginal estrogen cream.  She was also on hydroxyzine but cannot take this due to drowsiness.   PMH: Past Medical History:  Diagnosis Date  . Sleep apnea     Surgical History: Past Surgical History:  Procedure Laterality Date  . ABDOMINAL HYSTERECTOMY    . HEEL SPUR SURGERY Right   . HERNIA REPAIR    . KNEE ARTHROSCOPY Left   . LAPAROSCOPIC GASTRIC BANDING    . SHOULDER ARTHROSCOPY WITH DEBRIDEMENT AND BICEP TENDON REPAIR Right 08/12/2015   Procedure: SHOULDER ARTHROSCOPY WITH DEBRIDMENT, DECOMPRESSION, SLAP REPAIR, MINI OPEN BICEPS TENDONESIS;  Surgeon: Corky Mull, MD;  Location: [facility] ORS;  Service: Orthopedics;  Laterality: Right;  . TONSILLECTOMY      Home Medications:  Allergies as of 02/14/2018      Reactions   Nsaids Other (See Comments)   Patient has lap band      Medication List        Accurate as of 02/14/18  1:46 PM. Always use your most recent med list.          estradiol 1 MG tablet Commonly known as:  ESTRACE Take 1 mg by mouth daily.   Estradiol 10 MCG Tabs vaginal tablet Place vaginally.   mirabegron ER 25 MG Tb24 tablet Commonly known as:  MYRBETRIQ Take 1 tablet (25 mg total) by mouth daily.   MULTI-VITAMINS Tabs Take by mouth.   triamcinolone cream 0.1 % Commonly known as:  KENALOG       Allergies:  Allergies  Allergen Reactions  . Nsaids Other (See Comments)    Patient has lap band    Family History: Family History  Problem Relation Age of Onset  . Breast cancer Neg Hx   . Bladder Cancer Neg Hx   . Kidney cancer Neg Hx     Social History:  reports that  has never smoked. she has never used smokeless tobacco. She reports that she does not drink alcohol or use drugs.  ROS: UROLOGY Frequent Urination?: Yes Hard to postpone urination?: Yes Burning/pain with urination?: Yes Get up at night to urinate?: Yes Leakage of urine?: No Urine stream starts and stops?: Yes Trouble starting stream?: No Do you have to strain to urinate?: No Blood in urine?: No Urinary tract infection?: No  Sexually transmitted disease?: No Injury to kidneys or bladder?: No Painful intercourse?: Yes Weak stream?: No Currently pregnant?: No Vaginal bleeding?: No Last menstrual period?: n  Gastrointestinal Nausea?: No Vomiting?: No Indigestion/heartburn?: No Diarrhea?: No Constipation?: No  Constitutional Fever: No Night sweats?: No Weight loss?: No Fatigue?: No  Skin Skin rash/lesions?: No Itching?: No  Eyes Blurred vision?: No Double vision?: No  Ears/Nose/Throat Sore throat?: No Sinus problems?: No  Hematologic/Lymphatic Swollen glands?: No Easy bruising?: No  Cardiovascular Leg swelling?: No Chest pain?: No  Respiratory Cough?: No Shortness of breath?: No  Endocrine Excessive thirst?:  No  Musculoskeletal Back pain?: No Joint pain?: No  Neurological Headaches?: No Dizziness?: No  Psychologic Depression?: No Anxiety?: No  Physical Exam: BP 138/82 (BP Location: Right Arm, Patient Position: Sitting, Cuff Size: Large)   Pulse 66   Ht 5\' 3"  (1.6 m)   Wt 218 lb 8 oz (99.1 kg)   BMI 38.71 kg/m   Constitutional:  Alert and oriented, No acute distress. HEENT: [place] AT, moist mucus membranes.  Trachea midline, no masses. Cardiovascular: No clubbing, cyanosis, or edema. Respiratory: Normal respiratory effort, no increased work of breathing. GI: Abdomen is soft, nontender, nondistended, no abdominal masses GU: No CVA tenderness.  Skin: No rashes, bruises or suspicious lesions. Lymph: No cervical or inguinal adenopathy. Neurologic: Grossly intact, no focal deficits, moving all 4 extremities. Psychiatric: Normal mood and affect.  Laboratory Data: Lab Results  Component Value Date   WBC 10.4 12/28/2014   HGB 14.8 12/28/2014   HCT 43.9 12/28/2014   MCV 97 12/28/2014   PLT 225 12/28/2014    Lab Results  Component Value Date   CREATININE 0.66 08/09/2015    No results found for: PSA  No results found for: TESTOSTERONE  No results found for: HGBA1C  Urinalysis    Component Value Date/Time   COLORURINE Straw 04/30/2012 1242   APPEARANCEUR Clear 04/30/2012 1242   LABSPEC 1.009 04/30/2012 1242   PHURINE 7.0 04/30/2012 1242   GLUCOSEU Negative 04/30/2012 1242   HGBUR Negative 04/30/2012 1242   BILIRUBINUR Negative 04/30/2012 1242   KETONESUR Negative 04/30/2012 1242   PROTEINUR Negative 04/30/2012 1242   NITRITE Negative 04/30/2012 1242   LEUKOCYTESUR Negative 04/30/2012 1242    Assessment & Plan:    1.  Interstitial cystitis I discussed with the patient that interstitial cystitis is a disease of exclusion and there is no test to diagnose it.  It is simply a diagnosis that is given to a patient after all other potential sources of her symptoms have been  ruled out.  We did discuss that it is absolutely essential for her to undergo cystoscopy to rule out any bladder pathology as a source of her symptoms.  We will schedule this in the near future.  For the time being, we will start her on Myrbetriq for her urinary frequency.  She was recommended to try over-the-counter AZO for her dysuria.  We did discuss pelvic floor physical therapy once other pathology has been ruled out.  We did discuss that this disease process is difficult to treat, and treatment is aimed at symptomatic relief.  She will follow-up for cystoscopy and pelvic exam.  Return for cysto and pelvic exam.  Nickie Retort, MD  [facility] [address], [place], [place] [address] [phone]

## 2018-02-15 LAB — URINALYSIS, COMPLETE
BILIRUBIN UA: NEGATIVE
Glucose, UA: NEGATIVE
KETONES UA: NEGATIVE
Leukocytes, UA: NEGATIVE
NITRITE UA: NEGATIVE
Protein, UA: NEGATIVE
RBC UA: NEGATIVE
SPEC GRAV UA: 1.015 (ref 1.005–1.030)
Urobilinogen, Ur: 0.2 mg/dL (ref 0.2–1.0)
pH, UA: 6.5 (ref 5.0–7.5)

## 2018-02-15 LAB — MICROSCOPIC EXAMINATION

## 2018-02-20 DIAGNOSIS — M722 Plantar fascial fibromatosis: Secondary | ICD-10-CM | POA: Diagnosis not present

## 2018-02-20 DIAGNOSIS — M79672 Pain in left foot: Secondary | ICD-10-CM | POA: Diagnosis not present

## 2018-02-26 DIAGNOSIS — M722 Plantar fascial fibromatosis: Secondary | ICD-10-CM | POA: Diagnosis not present

## 2018-02-26 DIAGNOSIS — M79672 Pain in left foot: Secondary | ICD-10-CM | POA: Diagnosis not present

## 2018-03-01 ENCOUNTER — Ambulatory Visit (INDEPENDENT_AMBULATORY_CARE_PROVIDER_SITE_OTHER): Payer: 59 | Admitting: Urology

## 2018-03-01 ENCOUNTER — Encounter: Payer: Self-pay | Admitting: Urology

## 2018-03-01 VITALS — BP 139/80 | HR 83 | Ht 63.0 in | Wt 218.4 lb

## 2018-03-01 DIAGNOSIS — N3010 Interstitial cystitis (chronic) without hematuria: Secondary | ICD-10-CM

## 2018-03-01 LAB — URINALYSIS, COMPLETE
BILIRUBIN UA: NEGATIVE
GLUCOSE, UA: NEGATIVE
KETONES UA: NEGATIVE
Leukocytes, UA: NEGATIVE
NITRITE UA: NEGATIVE
Protein, UA: NEGATIVE
RBC UA: NEGATIVE
SPEC GRAV UA: 1.015 (ref 1.005–1.030)
UUROB: 0.2 mg/dL (ref 0.2–1.0)
pH, UA: 7 (ref 5.0–7.5)

## 2018-03-01 LAB — MICROSCOPIC EXAMINATION: RBC MICROSCOPIC, UA: NONE SEEN /HPF (ref 0–2)

## 2018-03-01 MED ORDER — DOXYCYCLINE HYCLATE 100 MG PO TABS
100.0000 mg | ORAL_TABLET | Freq: Once | ORAL | Status: AC
  Administered 2018-03-01: 100 mg via ORAL

## 2018-03-01 MED ORDER — CIPROFLOXACIN HCL 500 MG PO TABS: 500.0000 mg | ORAL_TABLET | Freq: Once | ORAL | Status: DC

## 2018-03-01 MED ORDER — DOXYCYCLINE HYCLATE 100 MG PO TABS: 100.0000 mg | ORAL_TABLET | Freq: Two times a day (BID) | ORAL | Status: DC

## 2018-03-01 MED ORDER — LIDOCAINE HCL 2 % EX GEL
1.0000 "application " | Freq: Once | CUTANEOUS | Status: AC
  Administered 2018-03-01: 1 via URETHRAL

## 2018-03-01 NOTE — Addendum Note (Signed)
Addended by: Kerry Hough on: 03/01/2018 03:58 PM   Modules accepted: Level of Service

## 2018-03-01 NOTE — Progress Notes (Addendum)
Patient was given antibiotic and started  vomiting possibly from her antibiotic getting stuck in her lap band.  We will reschedule her cystoscopy when she is no longer vomiting.  She will take her antibiotic after the procedure at next visit.  She has not started her Myrbetriq that was prescribed last visit.  She will start this prior to follow-up to get an idea of how it is working.  The Azo has helped with her dysuria.

## 2018-03-08 ENCOUNTER — Encounter: Payer: Self-pay | Admitting: Urology

## 2018-03-08 ENCOUNTER — Ambulatory Visit: Payer: 59 | Admitting: Urology

## 2018-03-08 ENCOUNTER — Other Ambulatory Visit: Payer: Self-pay | Admitting: Radiology

## 2018-03-08 VITALS — BP 151/87 | HR 103 | Ht 63.0 in | Wt 217.0 lb

## 2018-03-08 DIAGNOSIS — N329 Bladder disorder, unspecified: Secondary | ICD-10-CM

## 2018-03-08 DIAGNOSIS — N3010 Interstitial cystitis (chronic) without hematuria: Secondary | ICD-10-CM | POA: Diagnosis not present

## 2018-03-08 LAB — URINALYSIS, COMPLETE
BILIRUBIN UA: NEGATIVE
Glucose, UA: NEGATIVE
KETONES UA: NEGATIVE
Leukocytes, UA: NEGATIVE
NITRITE UA: NEGATIVE
PH UA: 6 (ref 5.0–7.5)
Protein, UA: NEGATIVE
SPEC GRAV UA: 1.01 (ref 1.005–1.030)
Urobilinogen, Ur: 0.2 mg/dL (ref 0.2–1.0)

## 2018-03-08 LAB — MICROSCOPIC EXAMINATION
RBC MICROSCOPIC, UA: NONE SEEN /HPF (ref 0–2)
WBC UA: NONE SEEN /HPF (ref 0–5)

## 2018-03-08 NOTE — H&P (View-Only) (Signed)
   03/08/18  CC:  Chief Complaint  Patient presents with  . Cysto    HPI: The patient is a 57 year old female who presents today to discuss her urinary symptoms.  She has been told that she may have interstitial cystitis.  She has not previously seen a urologist until her initial visit here earlier this month.  She has been having significant problems with urinary frequency, dysuria, and suprapubic pressure for the last year.  She has had multiple negative urine cultures.  This is all new within the last year or so.  She did not have this problem prior.  She denies any hematuria.  She does not have a history of recurrent UTI.  She has had a hysterectomy but no other genitourinary surgery.  Her symptoms are worsened by certain foods such as acidic foods and coffee.  She has bought a book on interstitial cystitis foods to avoid which avoiding these foods does help.  Her frequency is every 30 minutes.  This is affecting her quality of life.  She is currently on vaginal estrogen cream.  She was also on hydroxyzine but cannot take this due to drowsiness.  She was started on Azo at her last visit for her dysuria which seems to be helping.  She was also started on Myrbetriq for irritative voiding symptoms, particularly her urinary frequency.   She presents today for cystoscopy to rule out any other source of her interstitial cystitis type symptoms.   Height 5\' 3"  (1.6 m), weight 217 lb (98.4 kg). NED. A&Ox3.   No respiratory distress   Abd soft, NT, ND Normal external genitalia with patent urethral meatus  Cystoscopy Procedure Note  Patient identification was confirmed, informed consent was obtained, and patient was prepped using Betadine solution.  Lidocaine jelly was administered per urethral meatus.    Preoperative abx where received prior to procedure.    Procedure: - Flexible cystoscope introduced, without any difficulty.   - Thorough search of the bladder revealed:    normal  urethral meatus    normal urothelium    no stones    no ulcers     no tumors    Throughout the bladder there are multiple erythematous carpet-like lesions that could potentially represent CIS.  - Ureteral orifices were normal in position and appearance.  Post-Procedure: - Patient tolerated the procedure well  Assessment/ Plan:  1.  Interstitial cystitis -Continue Azo and Myrbetriq for symptomatic relief. -Can consider pelvic floor PT in the future  2.  Bladder lesions I discussed the patient that she does have erythematous lesions within her bladder that are more than likely chronic inflammation however CIS cannot be ruled out.  I discussed her to be in her best interest to undergo cystoscopy with bladder biopsy and fulguration of bleeders.  The risks benefits and indications of this were described in great detail.  She understands the risks include but not limited to bleeding, infection, iatrogenic injury, need for repeat procedures.  All questions were answered.  The patient has agreed to proceed.   Lynn Retort, MD

## 2018-03-08 NOTE — Progress Notes (Signed)
   03/08/18  CC:  Chief Complaint  Patient presents with  . Cysto    HPI: The patient is a 57 year old female who presents today to discuss her urinary symptoms.  She has been told that she may have interstitial cystitis.  She has not previously seen a urologist until her initial visit here earlier this month.  She has been having significant problems with urinary frequency, dysuria, and suprapubic pressure for the last year.  She has had multiple negative urine cultures.  This is all new within the last year or so.  She did not have this problem prior.  She denies any hematuria.  She does not have a history of recurrent UTI.  She has had a hysterectomy but no other genitourinary surgery.  Her symptoms are worsened by certain foods such as acidic foods and coffee.  She has bought a book on interstitial cystitis foods to avoid which avoiding these foods does help.  Her frequency is every 30 minutes.  This is affecting her quality of life.  She is currently on vaginal estrogen cream.  She was also on hydroxyzine but cannot take this due to drowsiness.  She was started on Azo at her last visit for her dysuria which seems to be helping.  She was also started on Myrbetriq for irritative voiding symptoms, particularly her urinary frequency.   She presents today for cystoscopy to rule out any other source of her interstitial cystitis type symptoms.   Height 5\' 3"  (1.6 m), weight 217 lb (98.4 kg). NED. A&Ox3.   No respiratory distress   Abd soft, NT, ND Normal external genitalia with patent urethral meatus  Cystoscopy Procedure Note  Patient identification was confirmed, informed consent was obtained, and patient was prepped using Betadine solution.  Lidocaine jelly was administered per urethral meatus.    Preoperative abx where received prior to procedure.    Procedure: - Flexible cystoscope introduced, without any difficulty.   - Thorough search of the bladder revealed:    normal  urethral meatus    normal urothelium    no stones    no ulcers     no tumors    Throughout the bladder there are multiple erythematous carpet-like lesions that could potentially represent CIS.  - Ureteral orifices were normal in position and appearance.  Post-Procedure: - Patient tolerated the procedure well  Assessment/ Plan:  1.  Interstitial cystitis -Continue Azo and Myrbetriq for symptomatic relief. -Can consider pelvic floor PT in the future  2.  Bladder lesions I discussed the patient that she does have erythematous lesions within her bladder that are more than likely chronic inflammation however CIS cannot be ruled out.  I discussed her to be in her best interest to undergo cystoscopy with bladder biopsy and fulguration of bleeders.  The risks benefits and indications of this were described in great detail.  She understands the risks include but not limited to bleeding, infection, iatrogenic injury, need for repeat procedures.  All questions were answered.  The patient has agreed to proceed.   Nickie Retort, MD

## 2018-03-11 ENCOUNTER — Telehealth: Payer: Self-pay | Admitting: Urology

## 2018-03-11 ENCOUNTER — Ambulatory Visit (INDEPENDENT_AMBULATORY_CARE_PROVIDER_SITE_OTHER): Payer: 59

## 2018-03-11 VITALS — BP 136/86 | HR 89 | Ht 63.0 in | Wt 216.0 lb

## 2018-03-11 DIAGNOSIS — N329 Bladder disorder, unspecified: Secondary | ICD-10-CM | POA: Diagnosis not present

## 2018-03-11 LAB — URINALYSIS, COMPLETE
BILIRUBIN UA: NEGATIVE
Glucose, UA: NEGATIVE
KETONES UA: NEGATIVE
Leukocytes, UA: NEGATIVE
Nitrite, UA: NEGATIVE
Protein, UA: NEGATIVE
RBC UA: NEGATIVE
SPEC GRAV UA: 1.015 (ref 1.005–1.030)
UUROB: 0.2 mg/dL (ref 0.2–1.0)
pH, UA: 6.5 (ref 5.0–7.5)

## 2018-03-11 LAB — CULTURE, URINE COMPREHENSIVE

## 2018-03-11 NOTE — Progress Notes (Signed)
Pt presents to clinic today with c/o urinary frequency, hard to postpone urination, and dysuria. A clean catch was obtained for u/a and cx. PVR: 0.   Per Dr. Sharlot Gowda samples were given to help with dysuria.   Blood pressure 136/86, pulse 89, height 5\' 3"  (1.6 m), weight 216 lb (98 kg).

## 2018-03-11 NOTE — Telephone Encounter (Signed)
Patient presented to the office today with a complaint of severe vaginal pain post cystoscopy on 3/29 with Dr. Pilar Jarvis.  Chelsea noted that the patient did not get a cipro on the day of the procedure.    Patient is scheduled for a cystocopy with bladder biopsy with Dr. Erlene Quan on 4/10.  She has lots of questions and would like to meet Dr. Erlene Quan prior to the procedure.  I scheduled a 15 minute appointment with Dr. Erlene Quan on 4/5 at 11:30am.  Per Larene Beach, we should have the patient complete a UTI checklist, provide a urine sample for culture and do a PVR.  I added the patient to the nurse schedule for today and checked her in.

## 2018-03-14 ENCOUNTER — Encounter
Admission: RE | Admit: 2018-03-14 | Discharge: 2018-03-14 | Disposition: A | Discharge status: Home / Self Care | Payer: 59 | Source: Ambulatory Visit | Attending: Urology | Admitting: Urology

## 2018-03-14 ENCOUNTER — Telehealth: Payer: Self-pay

## 2018-03-14 ENCOUNTER — Other Ambulatory Visit: Payer: Self-pay

## 2018-03-14 HISTORY — DX: Personal history of other diseases of the digestive system: Z87.19

## 2018-03-14 HISTORY — DX: Malignant (primary) neoplasm, unspecified: C80.1

## 2018-03-14 HISTORY — DX: Unspecified osteoarthritis, unspecified site: M19.90

## 2018-03-14 HISTORY — DX: Pneumonia, unspecified organism: J18.9

## 2018-03-14 HISTORY — DX: Gastro-esophageal reflux disease without esophagitis: K21.9

## 2018-03-14 LAB — CULTURE, URINE COMPREHENSIVE

## 2018-03-14 NOTE — Telephone Encounter (Signed)
-----   Message from Nori Riis, PA-C sent at 03/14/2018  3:31 PM EDT ----- Please let Mrs. Schlereth know that her urine culture is negative.

## 2018-03-14 NOTE — Telephone Encounter (Signed)
Letter sent.

## 2018-03-14 NOTE — Patient Instructions (Signed)
Your procedure is scheduled on: 03-20-18  Report to Same Day Surgery 2nd floor medical mall [facility] Entrance-take elevator on left to 2nd floor.  Check in with surgery information desk.) To find out your arrival time please call [phone] between 1PM - 3PM on 03-19-18  Remember: Instructions that are not followed completely may result in serious medical risk, up to and including death, or upon the discretion of your surgeon and anesthesiologist your surgery may need to be rescheduled.    _x___ 1. Do not eat food after midnight the night before your procedure. NO GUM OR CANDY AFTER MIDNIGHT.  You may drink clear liquids up to 2 hours before you are scheduled to arrive at the hospital for your procedure.  Do not drink clear liquids within 2 hours of your scheduled arrival to the hospital.  Clear liquids include  --Water or Apple juice without pulp  --Clear carbohydrate beverage such as ClearFast or Gatorade  --Black Coffee or Clear Tea (No milk, no creamers, do not add anything to  the coffee or Tea    __x__ 2. No Alcohol for 24 hours before or after surgery.   __x__3. No Smoking or e-cigarettes for 24 prior to surgery.  Do not use any chewable tobacco products for at least 6 hour prior to surgery   ____  4. Bring all medications with you on the day of surgery if instructed.    __x__ 5. Notify your doctor if there is any change in your medical condition     (cold, fever, infections).    x___6. On the morning of surgery brush your teeth with toothpaste and water.  You may rinse your mouth with mouth wash if you wish.  Do not swallow any toothpaste or mouthwash.   Do not wear jewelry, make-up, hairpins, clips or nail polish.  Do not wear lotions, powders, or perfumes. You may wear deodorant.  Do not shave 48 hours prior to surgery. Men may shave face and neck.  Do not bring valuables to the hospital.    [facility] is not responsible for any belongings or valuables.    Contacts, dentures or bridgework may not be worn into surgery.  Leave your suitcase in the car. After surgery it may be brought to your room.  For patients admitted to the hospital, discharge time is determined by your  treatment team.  _  Patients discharged the day of surgery will not be allowed to drive home.  You will need someone to drive you home and stay with you the night of your procedure.    Please read over the following fact sheets that you were given:   [facility] Preparing for Surgery and or MRSA Information   ____ Take anti-hypertensive listed below, cardiac, seizure, asthma, anti-reflux and psychiatric medicines. These include:  1. NONE  2.  3.  4.  5.  6.  ____Fleets enema or Magnesium Citrate as directed.   ____ Use CHG Soap or sage wipes as directed on instruction sheet   ____ Use inhalers on the day of surgery and bring to hospital day of surgery  ____ Stop Metformin and Janumet 2 days prior to surgery.    ____ Take 1/2 of usual insulin dose the night before surgery and none on the morning surgery.   ____ Follow recommendations from Cardiologist, Pulmonologist or PCP regarding stopping Aspirin, Coumadin, Plavix ,Eliquis, Effient, or Pradaxa, and Pletal.  X____Stop Anti-inflammatories such as Advil, Aleve, Ibuprofen, Motrin, Naproxen, Naprosyn, Goodies powders or  aspirin products. OK to take Tylenol-PT UNABLE TO TAKE NSAIDS DUE TO LAP BAND SURGERY   _x___ Stop supplements until after surgery-PT STATES SHE HAS NOT TAKEN FISH OIL IN A WHILE-INSTRUCTED HER NOT TO START BACK IF SHE DECIDES TO UNTIL AFTER SURGERY   _X___ Bring C-Pap to the hospital.

## 2018-03-15 ENCOUNTER — Ambulatory Visit: Payer: 59 | Admitting: Urology

## 2018-03-15 ENCOUNTER — Encounter: Payer: Self-pay | Admitting: Urology

## 2018-03-15 VITALS — BP 126/83 | HR 89 | Ht 63.0 in | Wt 216.2 lb

## 2018-03-15 DIAGNOSIS — N3289 Other specified disorders of bladder: Secondary | ICD-10-CM

## 2018-03-19 MED ORDER — CEFAZOLIN SODIUM-DEXTROSE 2-4 GM/100ML-% IV SOLN
2.0000 g | INTRAVENOUS | Status: AC
  Administered 2018-03-20: 2 g via INTRAVENOUS
  Filled 2018-03-19: qty 100

## 2018-03-20 ENCOUNTER — Ambulatory Visit
Admission: RE | Admit: 2018-03-20 | Discharge: 2018-03-20 | Disposition: A | Discharge status: Home / Self Care | Payer: 59 | Source: Ambulatory Visit | Attending: Urology | Admitting: Urology

## 2018-03-20 ENCOUNTER — Ambulatory Visit: Payer: 59 | Admitting: Anesthesiology

## 2018-03-20 ENCOUNTER — Encounter
Admission: RE | Disposition: A | Discharge status: Home / Self Care | Payer: Self-pay | Source: Ambulatory Visit | Attending: Urology

## 2018-03-20 DIAGNOSIS — N329 Bladder disorder, unspecified: Secondary | ICD-10-CM | POA: Diagnosis not present

## 2018-03-20 DIAGNOSIS — K219 Gastro-esophageal reflux disease without esophagitis: Secondary | ICD-10-CM | POA: Insufficient documentation

## 2018-03-20 DIAGNOSIS — G4730 Sleep apnea, unspecified: Secondary | ICD-10-CM | POA: Insufficient documentation

## 2018-03-20 DIAGNOSIS — Z9884 Bariatric surgery status: Secondary | ICD-10-CM | POA: Diagnosis not present

## 2018-03-20 DIAGNOSIS — N3289 Other specified disorders of bladder: Secondary | ICD-10-CM | POA: Diagnosis not present

## 2018-03-20 DIAGNOSIS — K449 Diaphragmatic hernia without obstruction or gangrene: Secondary | ICD-10-CM | POA: Insufficient documentation

## 2018-03-20 DIAGNOSIS — N3010 Interstitial cystitis (chronic) without hematuria: Secondary | ICD-10-CM | POA: Insufficient documentation

## 2018-03-20 DIAGNOSIS — L538 Other specified erythematous conditions: Secondary | ICD-10-CM | POA: Insufficient documentation

## 2018-03-20 DIAGNOSIS — N3020 Other chronic cystitis without hematuria: Secondary | ICD-10-CM | POA: Diagnosis present

## 2018-03-20 DIAGNOSIS — Z90710 Acquired absence of both cervix and uterus: Secondary | ICD-10-CM | POA: Diagnosis not present

## 2018-03-20 HISTORY — PX: CYSTOSCOPY WITH BIOPSY: SHX5122

## 2018-03-20 HISTORY — PX: CYSTO WITH HYDRODISTENSION: SHX5453

## 2018-03-20 HISTORY — PX: CYSTOSCOPY WITH FULGERATION: SHX6638

## 2018-03-20 SURGERY — CYSTOSCOPY, WITH BIOPSY
Anesthesia: General | Site: Bladder | Wound class: Clean Contaminated

## 2018-03-20 MED ORDER — PHENYLEPHRINE HCL 10 MG/ML IJ SOLN
INTRAMUSCULAR | Status: DC | PRN
  Administered 2018-03-20: 100 ug via INTRAVENOUS

## 2018-03-20 MED ORDER — DEXAMETHASONE SODIUM PHOSPHATE 10 MG/ML IJ SOLN
INTRAMUSCULAR | Status: DC | PRN
  Administered 2018-03-20: 10 mg via INTRAVENOUS

## 2018-03-20 MED ORDER — CEFAZOLIN SODIUM-DEXTROSE 2-3 GM-%(50ML) IV SOLR
INTRAVENOUS | Status: AC
  Filled 2018-03-20: qty 50

## 2018-03-20 MED ORDER — HYDROCODONE-ACETAMINOPHEN 5-325 MG PO TABS
ORAL_TABLET | ORAL | Status: AC
  Filled 2018-03-20: qty 1

## 2018-03-20 MED ORDER — ONDANSETRON HCL 4 MG/2ML IJ SOLN
INTRAMUSCULAR | Status: DC | PRN
  Administered 2018-03-20: 4 mg via INTRAVENOUS

## 2018-03-20 MED ORDER — SUGAMMADEX SODIUM 200 MG/2ML IV SOLN
INTRAVENOUS | Status: DC | PRN
  Administered 2018-03-20: 196 mg via INTRAVENOUS

## 2018-03-20 MED ORDER — OXYBUTYNIN CHLORIDE 5 MG PO TABS: 5.0000 mg | ORAL_TABLET | Freq: Three times a day (TID) | ORAL | 0 refills | Status: DC | PRN

## 2018-03-20 MED ORDER — PROPOFOL 10 MG/ML IV BOLUS
INTRAVENOUS | Status: DC | PRN
  Administered 2018-03-20: 170 mg via INTRAVENOUS

## 2018-03-20 MED ORDER — FENTANYL CITRATE (PF) 100 MCG/2ML IJ SOLN
25.0000 ug | INTRAMUSCULAR | Status: DC | PRN
  Administered 2018-03-20 (×4): 25 ug via INTRAVENOUS

## 2018-03-20 MED ORDER — DOCUSATE SODIUM 100 MG PO CAPS: 100.0000 mg | ORAL_CAPSULE | Freq: Two times a day (BID) | ORAL | 0 refills | Status: DC

## 2018-03-20 MED ORDER — SEVOFLURANE IN SOLN
RESPIRATORY_TRACT | Status: AC
  Filled 2018-03-20: qty 250

## 2018-03-20 MED ORDER — LIDOCAINE HCL (PF) 1 % IJ SOLN
INTRAMUSCULAR | Status: AC
  Filled 2018-03-20: qty 2

## 2018-03-20 MED ORDER — MIDAZOLAM HCL 2 MG/2ML IJ SOLN
INTRAMUSCULAR | Status: AC
  Filled 2018-03-20: qty 2

## 2018-03-20 MED ORDER — GLYCOPYRROLATE 0.2 MG/ML IJ SOLN
INTRAMUSCULAR | Status: AC
  Administered 2018-03-20: 0.2 mg via INTRAVENOUS
  Filled 2018-03-20: qty 1

## 2018-03-20 MED ORDER — ROCURONIUM BROMIDE 100 MG/10ML IV SOLN
INTRAVENOUS | Status: DC | PRN
  Administered 2018-03-20: 40 mg via INTRAVENOUS
  Administered 2018-03-20: 10 mg via INTRAVENOUS

## 2018-03-20 MED ORDER — BELLADONNA ALKALOIDS-OPIUM 16.2-60 MG RE SUPP: 1.0000 | Freq: Every day | RECTAL | Status: DC

## 2018-03-20 MED ORDER — KETOROLAC TROMETHAMINE 30 MG/ML IJ SOLN
30.0000 mg | Freq: Four times a day (QID) | INTRAMUSCULAR | Status: DC | PRN
  Administered 2018-03-20: 30 mg via INTRAVENOUS

## 2018-03-20 MED ORDER — MIDAZOLAM HCL 2 MG/2ML IJ SOLN
INTRAMUSCULAR | Status: DC | PRN
  Administered 2018-03-20: 2 mg via INTRAVENOUS

## 2018-03-20 MED ORDER — FENTANYL CITRATE (PF) 100 MCG/2ML IJ SOLN
INTRAMUSCULAR | Status: AC
  Filled 2018-03-20: qty 2

## 2018-03-20 MED ORDER — LIDOCAINE HCL (CARDIAC) 20 MG/ML IV SOLN
INTRAVENOUS | Status: DC | PRN
  Administered 2018-03-20: 100 mg via INTRAVENOUS

## 2018-03-20 MED ORDER — HYDROMORPHONE HCL 1 MG/ML IJ SOLN
0.2500 mg | INTRAMUSCULAR | Status: DC | PRN
  Administered 2018-03-20 (×4): 0.25 mg via INTRAVENOUS

## 2018-03-20 MED ORDER — LACTATED RINGERS IV SOLN
INTRAVENOUS | Status: DC
  Administered 2018-03-20 (×2): via INTRAVENOUS

## 2018-03-20 MED ORDER — FENTANYL CITRATE (PF) 100 MCG/2ML IJ SOLN
INTRAMUSCULAR | Status: DC | PRN
  Administered 2018-03-20 (×2): 25 ug via INTRAVENOUS
  Administered 2018-03-20: 50 ug via INTRAVENOUS

## 2018-03-20 MED ORDER — ONDANSETRON HCL 4 MG/2ML IJ SOLN: 4.0000 mg | Freq: Once | INTRAMUSCULAR | Status: DC | PRN

## 2018-03-20 MED ORDER — HYDROCODONE-ACETAMINOPHEN 5-325 MG PO TABS
1.0000 | ORAL_TABLET | Freq: Four times a day (QID) | ORAL | Status: DC | PRN
  Administered 2018-03-20: 1 via ORAL

## 2018-03-20 MED ORDER — HYDROMORPHONE HCL 1 MG/ML IJ SOLN
INTRAMUSCULAR | Status: AC
  Administered 2018-03-20: 0.25 mg via INTRAVENOUS
  Filled 2018-03-20: qty 1

## 2018-03-20 MED ORDER — BELLADONNA ALKALOIDS-OPIUM 16.2-60 MG RE SUPP
RECTAL | Status: AC
  Administered 2018-03-20: 1
  Filled 2018-03-20: qty 1

## 2018-03-20 MED ORDER — HYDROCODONE-ACETAMINOPHEN 5-325 MG PO TABS: 1.0000 | ORAL_TABLET | Freq: Four times a day (QID) | ORAL | 0 refills | Status: DC | PRN

## 2018-03-20 MED ORDER — SUCCINYLCHOLINE CHLORIDE 20 MG/ML IJ SOLN
INTRAMUSCULAR | Status: DC | PRN
  Administered 2018-03-20: 120 mg via INTRAVENOUS

## 2018-03-20 MED ORDER — KETOROLAC TROMETHAMINE 30 MG/ML IJ SOLN
INTRAMUSCULAR | Status: AC
  Administered 2018-03-20: 30 mg via INTRAVENOUS
  Filled 2018-03-20: qty 1

## 2018-03-20 MED ORDER — GLYCOPYRROLATE 0.2 MG/ML IJ SOLN
0.2000 mg | Freq: Once | INTRAMUSCULAR | Status: AC
  Administered 2018-03-20: 0.2 mg via INTRAVENOUS

## 2018-03-20 MED ORDER — PROPOFOL 10 MG/ML IV BOLUS
INTRAVENOUS | Status: AC
  Filled 2018-03-20: qty 20

## 2018-03-20 MED ORDER — FENTANYL CITRATE (PF) 100 MCG/2ML IJ SOLN
INTRAMUSCULAR | Status: AC
  Administered 2018-03-20: 25 ug via INTRAVENOUS
  Filled 2018-03-20: qty 2

## 2018-03-20 SURGICAL SUPPLY — 25 items
BAG DRAIN CYSTO-URO LG1000N (MISCELLANEOUS) ×3 IMPLANT
BRUSH SCRUB EZ  4% CHG (MISCELLANEOUS) ×2
BRUSH SCRUB EZ 4% CHG (MISCELLANEOUS) ×1 IMPLANT
DRSG TELFA 4X3 1S NADH ST (GAUZE/BANDAGES/DRESSINGS) ×6 IMPLANT
ELECT REM PT RETURN 9FT ADLT (ELECTROSURGICAL) ×3
ELECTRODE REM PT RTRN 9FT ADLT (ELECTROSURGICAL) ×1 IMPLANT
GLOVE BIO SURGEON STRL SZ 6.5 (GLOVE) ×2 IMPLANT
GLOVE BIO SURGEONS STRL SZ 6.5 (GLOVE) ×1
GLOVE BIOGEL M 6.5 STRL (GLOVE) ×3 IMPLANT
GOWN STRL REUS W/ TWL LRG LVL3 (GOWN DISPOSABLE) ×2 IMPLANT
GOWN STRL REUS W/TWL LRG LVL3 (GOWN DISPOSABLE) ×4
INTRODUCER DILATOR DOUBLE (INTRODUCER) ×3 IMPLANT
KIT TURNOVER CYSTO (KITS) ×3 IMPLANT
NDL SAFETY ECLIPSE 18X1.5 (NEEDLE) ×1 IMPLANT
NEEDLE HYPO 18GX1.5 SHARP (NEEDLE) ×2
PACK CYSTO AR (MISCELLANEOUS) ×3 IMPLANT
SENSORWIRE 0.038 NOT ANGLED (WIRE) ×6
SET CYSTO W/LG BORE CLAMP LF (SET/KITS/TRAYS/PACK) ×3 IMPLANT
SHEATH URETERAL 12FRX35CM (MISCELLANEOUS) ×3 IMPLANT
SOL .9 NS 3000ML IRR  AL (IV SOLUTION) ×2
SOL .9 NS 3000ML IRR UROMATIC (IV SOLUTION) ×1 IMPLANT
SURGILUBE 2OZ TUBE FLIPTOP (MISCELLANEOUS) ×3 IMPLANT
WATER STERILE IRR 1000ML POUR (IV SOLUTION) ×3 IMPLANT
WATER STERILE IRR 3000ML UROMA (IV SOLUTION) ×3 IMPLANT
WIRE SENSOR 0.038 NOT ANGLED (WIRE) ×2 IMPLANT

## 2018-03-20 NOTE — Discharge Instructions (Addendum)
Transurethral Resection of Bladder Tumor (TURBT) or Bladder Biopsy ° ° °Definition: ° Transurethral Resection of the Bladder Tumor is a surgical procedure used to diagnose and remove tumors within the bladder. TURBT is the most common treatment for early stage bladder cancer. ° °General instructions: °   ° Your recent bladder surgery requires very little post hospital care but some definite precautions. ° °Despite the fact that no skin incisions were used, the area around the bladder incisions are raw and covered with scabs to promote healing and prevent bleeding. Certain precautions are needed to insure that the scabs are not disturbed over the next 2-4 weeks while the healing proceeds. ° °Because the raw surface inside your bladder and the irritating effects of urine you may expect frequency of urination and/or urgency (a stronger desire to urinate) and perhaps even getting up at night more often. This will usually resolve or improve slowly over the healing period. You may see some blood in your urine over the first 6 weeks. Do not be alarmed, even if the urine was clear for a while. Get off your feet and drink lots of fluids until clearing occurs. If you start to pass clots or don't improve call us. ° °Diet: ° °You may return to your normal diet immediately. Because of the raw surface of your bladder, alcohol, spicy foods, foods high in acid and drinks with caffeine may cause irritation or frequency and should be used in moderation. To keep your urine flowing freely and avoid constipation, drink plenty of fluids during the day (8-10 glasses). Tip: Avoid cranberry juice because it is very acidic. ° °Activity: ° °Your physical activity doesn't need to be restricted. However, if you are very active, you may see some blood in the urine. We suggest that you reduce your activity under the circumstances until the bleeding has stopped. ° °Bowels: ° °It is important to keep your bowels regular during the postoperative  period. Straining with bowel movements can cause bleeding. A bowel movement every other day is reasonable. Use a mild laxative if needed, such as milk of magnesia 2-3 tablespoons, or 2 Dulcolax tablets. Call if you continue to have problems. If you had been taking narcotics for pain, before, during or after your surgery, you may be constipated. Take a laxative if necessary. ° ° ° °Medication: ° °You should resume your pre-surgery medications unless told not to. In addition you may be given an antibiotic to prevent or treat infection. Antibiotics are not always necessary. All medication should be taken as prescribed until the bottles are finished unless you are having an unusual reaction to one of the drugs. ° ° °[facility] °[place], [place] [address] °[phone] ° ° ° °AMBULATORY SURGERY  °DISCHARGE INSTRUCTIONS ° ° °1) The drugs that you were given will stay in your system until tomorrow so for the next 24 hours you should not: ° °A) Drive an automobile °B) Make any legal decisions °C) Drink any alcoholic beverage ° ° °2) You may resume regular meals tomorrow.  Today it is better to start with liquids and gradually work up to solid foods. ° °You may eat anything you prefer, but it is better to start with liquids, then soup and crackers, and gradually work up to solid foods. ° ° °3) Please notify your doctor immediately if you have any unusual bleeding, trouble breathing, redness and pain at the surgery site, drainage, fever, or pain not relieved by medication. ° ° ° °4) Additional Instructions: ° ° ° ° ° ° ° °  Please contact your physician with any problems or Same Day Surgery at [phone], Monday through Friday 6 am to 4 pm, or [place] [facility] Main number at [phone]. ° °

## 2018-03-20 NOTE — Anesthesia Postprocedure Evaluation (Signed)
Anesthesia Post Note  Patient: Lynn Dennis  Procedure(s) Performed: CYSTOSCOPY WITH Bladder BIOPSY (N/A Bladder) CYSTOSCOPY WITH FULGERATION (N/A Bladder) CYSTOSCOPY/HYDRODISTENSION (N/A Bladder)  Patient location during evaluation: PACU Anesthesia Type: General Level of consciousness: awake and alert Pain management: pain level controlled Vital Signs Assessment: post-procedure vital signs reviewed and stable Respiratory status: spontaneous breathing and respiratory function stable Cardiovascular status: stable Anesthetic complications: no     Last Vitals:  Vitals:   03/20/18 1347 03/20/18 1353  BP:    Pulse: (!) 55 (!) 48  Resp: 18 16  Temp:    SpO2: 99% 96%    Last Pain:  Vitals:   03/20/18 1353  PainSc: 8                  KEPHART,WILLIAM K

## 2018-03-20 NOTE — Anesthesia Post-op Follow-up Note (Signed)
Anesthesia QCDR form completed.        

## 2018-03-20 NOTE — Anesthesia Procedure Notes (Signed)
Procedure Name: Intubation Date/Time: 03/20/2018 12:52 PM Performed by: Nelda Marseille, CRNA Pre-anesthesia Checklist: Patient identified, Patient being monitored, Timeout performed, Emergency Drugs available and Suction available Patient Re-evaluated:Patient Re-evaluated prior to induction Oxygen Delivery Method: Circle system utilized Preoxygenation: Pre-oxygenation with 100% oxygen Induction Type: IV induction Ventilation: Mask ventilation without difficulty Laryngoscope Size: Mac and 3 Grade View: Grade I Tube type: Oral Tube size: 7.0 mm Number of attempts: 1 Airway Equipment and Method: Stylet Placement Confirmation: ETT inserted through vocal cords under direct vision,  positive ETCO2 and breath sounds checked- equal and bilateral Secured at: 21 cm Tube secured with: Tape Dental Injury: Teeth and Oropharynx as per pre-operative assessment  Difficulty Due To: Difficulty was unanticipated Comments: Pinched lower lip with blade.  No bleeding.  Small area with little bruise.  !/4 cm long x 1/8 cm tall.

## 2018-03-20 NOTE — Transfer of Care (Signed)
Immediate Anesthesia Transfer of Care Note  Patient: Lynn Dennis  Procedure(s) Performed: CYSTOSCOPY WITH Bladder BIOPSY (N/A Bladder) CYSTOSCOPY WITH FULGERATION (N/A Bladder) CYSTOSCOPY/HYDRODISTENSION (N/A Bladder)  Patient Location: PACU  Anesthesia Type:General  Level of Consciousness: awake, alert  and oriented  Airway & Oxygen Therapy: Patient Spontanous Breathing and Patient connected to face mask oxygen  Post-op Assessment: Report given to RN and Post -op Vital signs reviewed and stable  Post vital signs: Reviewed and stable  Last Vitals:  Vitals Value Taken Time  BP 126/63 03/20/2018  1:41 PM  Temp    Pulse 61 03/20/2018  1:43 PM  Resp 19 03/20/2018  1:43 PM  SpO2 100 % 03/20/2018  1:43 PM  Vitals shown include unvalidated device data.  Last Pain: There were no vitals filed for this visit.       Complications: No apparent anesthesia complications

## 2018-03-20 NOTE — Interval H&P Note (Signed)
History and Physical Interval Note:  03/20/2018 12:35 PM  Lynn Dennis  has presented today for surgery, with the diagnosis of bladder lesion  The various methods of treatment have been discussed with the patient and family. After consideration of risks, benefits and other options for treatment, the patient has consented to  Procedure(s): CYSTOSCOPY WITH Bladder BIOPSY (N/A) CYSTOSCOPY WITH FULGERATION (N/A) as a surgical intervention .  The patient's history has been reviewed, patient examined, no change in status, stable for surgery.  I have reviewed the patient's chart and labs.  Questions were answered to the patient's satisfaction.    RRR cTAB  Hollice Espy

## 2018-03-20 NOTE — Anesthesia Preprocedure Evaluation (Signed)
Anesthesia Evaluation  Patient identified by MRN, date of birth, ID band Patient awake    Reviewed: Allergy & Precautions, NPO status , Patient's Chart, lab work & pertinent test results  History of Anesthesia Complications Negative for: history of anesthetic complications  Airway Mallampati: III       Dental   Pulmonary sleep apnea and Continuous Positive Airway Pressure Ventilation , neg COPD,           Cardiovascular (-) hypertension(-) Past MI and (-) CHF (-) dysrhythmias (-) Valvular Problems/Murmurs     Neuro/Psych neg Seizures    GI/Hepatic Neg liver ROS, hiatal hernia, GERD (improved after lap band)  ,  Endo/Other  neg diabetes  Renal/GU negative Renal ROS     Musculoskeletal   Abdominal   Peds  Hematology   Anesthesia Other Findings   Reproductive/Obstetrics                             Anesthesia Physical Anesthesia Plan  ASA: III  Anesthesia Plan: General   Post-op Pain Management:    Induction: Intravenous  PONV Risk Score and Plan: 3 and Dexamethasone, Ondansetron and Midazolam  Airway Management Planned: Oral ETT  Additional Equipment:   Intra-op Plan:   Post-operative Plan:   Informed Consent: I have reviewed the patients History and Physical, chart, labs and discussed the procedure including the risks, benefits and alternatives for the proposed anesthesia with the patient or authorized representative who has indicated his/her understanding and acceptance.     Plan Discussed with:   Anesthesia Plan Comments:         Anesthesia Quick Evaluation

## 2018-03-20 NOTE — Op Note (Signed)
Date of procedure: 03/20/18  Preoperative diagnosis:  1. Interstitial cystitis 2. Bladder erythema  Postoperative diagnosis:  1. Same as above  Procedure: 1. Cystoscopy 2. Bladder biopsy 3. Hydrodistention of the bladder  Surgeon: Hollice Espy, MD  Anesthesia: General  Complications: None  Intraoperative findings: Patchy erythema involving posterior and dome of bladder, velvety and concerning for possible underlying CIS.  450 cc bladder capacity with distention under anesthesia.  EBL: Minimal  Specimens: Bladder erythema  Drains: None  Indication: Lynn Dennis is a 57 y.o. patient with history of irritative voiding symptoms, interstitial cystitis who underwent office cystoscopy found to have significant erythema concerning for possible underlying process including CIS.  After reviewing the management options for treatment, she elected to proceed with the above surgical procedure(s). We have discussed the potential benefits and risks of the procedure, side effects of the proposed treatment, the likelihood of the patient achieving the goals of the procedure, and any potential problems that might occur during the procedure or recuperation. Informed consent has been obtained.  Description of procedure:  The patient was taken to the operating room and general anesthesia was induced.  The patient was placed in the dorsal lithotomy position, prepped and draped in the usual sterile fashion, and preoperative antibiotics were administered. A preoperative time-out was performed.   A 21 French rigid cystoscope was advanced per urethra into the bladder.  The bladder was carefully inspected and noted to be grossly abnormal with patchy velvety erythema involving the posterior wall and dome of bladder with other nonspecific patchy erythema diffusely.  There were a few areas of normal mucosal sparing on the lateral walls and trigone of the bladder.  UOs were noted to be in normal anatomic  position with clear reflux of urine from each.  There are no other papillary lesions, tumors, ulcerations, or any other abnormal findings.  No trabeculation appreciated.  At this point time, cold cup biopsy forceps were used to take 5 or 6 biopsies from the patchy erythema on the posterior wall and dome of bladder.  2 areas on the lateral walls which are also suspicious were also biopsied.  These were passed off the field as bladder erythema biopsies.  The biopsy sites were then fulgurated using Bugbee electrocautery.  At this point in time, given her history of interstitial cystitis and pain with office cystoscopy, I did go ahead and elect to do a hydrodistention while under anesthesia.  I allowed her bladder to fill passively until she leaked around the scope.  This is led to remain in place for several minutes.  At this time, the bladder was drained and a total capacity of 450 cc was appreciated.  Perform this procedure twice.  Finally, the bladder was drained and the urine remained clear.  She is clean and dry, repositioned in supine position, reversed from anesthesia, and taken to the PACU in stable condition.  Plan: I will call her with her pathology results as soon as the result.  She will follow-up with me in about 2 months to see if the Myrbetriq is helping her urinary symptoms after she recovers from this procedure.  Hollice Espy, M.D.

## 2018-03-21 ENCOUNTER — Encounter: Payer: Self-pay | Admitting: Urology

## 2018-03-21 NOTE — Addendum Note (Signed)
Addendum  created 03/21/18 1148 by Nelda Marseille, CRNA   Intraprocedure Meds edited

## 2018-03-22 LAB — SURGICAL PATHOLOGY

## 2018-03-26 ENCOUNTER — Telehealth: Payer: Self-pay | Admitting: Urology

## 2018-03-26 NOTE — Telephone Encounter (Signed)
Pt called today and would like biopsy results.

## 2018-03-26 NOTE — Telephone Encounter (Signed)
-----   Message from Hollice Espy, MD sent at 03/25/2018  9:29 AM EDT ----- Please call and let her know her pathology was BENIGN, ie not cancer.  There is inflammation/ cystitis only.    Hollice Espy, MD

## 2018-03-26 NOTE — Telephone Encounter (Signed)
Patient notified

## 2018-03-28 ENCOUNTER — Ambulatory Visit: Payer: 59

## 2018-05-16 NOTE — Progress Notes (Signed)
03/15/2018 10:24 AM   Lynn Dennis 1961/08/23 [id number]  Referring provider: Derinda Late, MD [phone] [facility] [place], [place] [address]  Chief Complaint  Patient presents with  . Follow-up    HPI: 57 year old female with probable history of interstitial cystitis found to have bladder erythema (in the absence of infection) on cystoscopy by Dr. Pilar Jarvis on 03/08/2018 scheduled for bladder biopsy who returns the office today to discuss this upcoming procedure she has multiple questions.  Please see previous notes for detail exacerbation s.  She has chronic dysuria as well as severe urgency or frequency.    She is currently on Estrace cream, given samples of Myrbetriq but has not started this medication, Pyridium for dysuria.  She is also prescribed Uribell for dysuria but has yet started taking this medication.  She has followed IC diet in the past which is been held for symptom control.  She is also tried hydroxyzine.  She is extremely anxious today about the upcoming procedure and has many questions.  She would like to discuss strategies for anticipated postoperative urinary issues.  PMH: Past Medical History:  Diagnosis Date  . Arthritis    neck  . Cancer (HCC)    squamous cell skin cancer  . GERD (gastroesophageal reflux disease)    only time is when lap band is loosened  . History of hiatal hernia    fixed with lap band surgery  . Pneumonia 2014  . Sleep apnea    uses cpap    Surgical History: Past Surgical History:  Procedure Laterality Date  . ABDOMINAL HYSTERECTOMY    . CYSTO WITH HYDRODISTENSION N/A 03/20/2018   Procedure: CYSTOSCOPY/HYDRODISTENSION;  Surgeon: Hollice Espy, MD;  Location: [facility] ORS;  Service: Urology;  Laterality: N/A;  . CYSTOSCOPY WITH BIOPSY N/A 03/20/2018   Procedure: CYSTOSCOPY WITH Bladder BIOPSY;  Surgeon: Hollice Espy, MD;  Location: [facility] ORS;  Service: Urology;  Laterality: N/A;    . CYSTOSCOPY WITH FULGERATION N/A 03/20/2018   Procedure: CYSTOSCOPY WITH FULGERATION;  Surgeon: Hollice Espy, MD;  Location: [facility] ORS;  Service: Urology;  Laterality: N/A;  . FRACTURE SURGERY     fractured finger  . HEEL SPUR SURGERY Right   . HERNIA REPAIR     age 70 inguinal   . KNEE ARTHROSCOPY Left   . LAPAROSCOPIC GASTRIC BANDING    . SHOULDER ARTHROSCOPY WITH DEBRIDEMENT AND BICEP TENDON REPAIR Right 08/12/2015   Procedure: SHOULDER ARTHROSCOPY WITH DEBRIDMENT, DECOMPRESSION, SLAP REPAIR, MINI OPEN BICEPS TENDONESIS;  Surgeon: Corky Mull, MD;  Location: [facility] ORS;  Service: Orthopedics;  Laterality: Right;  . TONSILLECTOMY      Home Medications:  Allergies as of 03/15/2018      Reactions   Adhesive [tape] Other (See Comments)   Skin discomfort with extended use   Gluten Meal    Gluten intolerant   Lactose Intolerance (gi) Other (See Comments)   Gi upset   Nsaids Other (See Comments)   Patient has lap band      Medication List        Accurate as of 03/15/18 11:59 PM. Always use your most recent med list.          acetaminophen 500 MG tablet Commonly known as:  TYLENOL Take 1,000 mg by mouth every 6 (six) hours as needed for moderate pain or headache.   AZO URINARY PAIN 97.5 MG Tabs Generic drug:  Phenazopyridine HCl Take 1 tablet by mouth at  bedtime as needed (for urinary pain relief).   carboxymethylcellulose 0.5 % Soln Commonly known as:  REFRESH PLUS Place 1 drop into both eyes at bedtime.   cimetidine 200 MG tablet Commonly known as:  TAGAMET Take 200 mg by mouth daily as needed (allergies).   docusate sodium 100 MG capsule Commonly known as:  COLACE Take 1 capsule (100 mg total) by mouth 2 (two) times daily.   estradiol 1 MG tablet Commonly known as:  ESTRACE Take 1 mg by mouth daily.   Estradiol 10 MCG Tabs vaginal tablet Place 10 mcg vaginally 2 (two) times a week.   FISH OIL PO Take 1 tablet by mouth daily.   fluticasone 50 MCG/ACT nasal  spray Commonly known as:  FLONASE Place 1 spray into both nostrils daily as needed for allergies or rhinitis.   HYDROcodone-acetaminophen 5-325 MG tablet Commonly known as:  NORCO/VICODIN Take 1-2 tablets by mouth every 6 (six) hours as needed for moderate pain.   mirabegron ER 25 MG Tb24 tablet Commonly known as:  MYRBETRIQ Take 1 tablet (25 mg total) by mouth daily.   MULTI-VITAMINS Tabs Take 1 tablet by mouth daily.   oxybutynin 5 MG tablet Commonly known as:  DITROPAN Take 1 tablet (5 mg total) by mouth every 8 (eight) hours as needed for bladder spasms.   PRELIEF PO Take 1 tablet by mouth 2 (two) times daily as needed (prior to eating highly acidic foods).   triamcinolone cream 0.1 % Commonly known as:  KENALOG Apply 1 application topically daily as needed (rash).   URIBEL 118 MG Caps Take 118 mg by mouth 4 (four) times daily as needed (urinary pain).       Allergies:  Allergies  Allergen Reactions  . Adhesive [Tape] Other (See Comments)    Skin discomfort with extended use  . Gluten Meal     Gluten intolerant  . Lactose Intolerance (Gi) Other (See Comments)    Gi upset  . Nsaids Other (See Comments)    Patient has lap band    Family History: Family History  Problem Relation Age of Onset  . Breast cancer Neg Hx   . Bladder Cancer Neg Hx   . Kidney cancer Neg Hx     Social History:  reports that she has never smoked. She has never used smokeless tobacco. She reports that she drinks alcohol. She reports that she does not use drugs.  ROS: UROLOGY Frequent Urination?: No Hard to postpone urination?: Yes Burning/pain with urination?: Yes Get up at night to urinate?: Yes Leakage of urine?: No Urine stream starts and stops?: No Trouble starting stream?: No Do you have to strain to urinate?: No Blood in urine?: No Urinary tract infection?: No Sexually transmitted disease?: No Injury to kidneys or bladder?: No Painful intercourse?: No Weak stream?:  No Currently pregnant?: No Vaginal bleeding?: No Last menstrual period?: n  Gastrointestinal Nausea?: No Vomiting?: No Indigestion/heartburn?: No Diarrhea?: No Constipation?: No  Constitutional Fever: No Night sweats?: No Weight loss?: No Fatigue?: No  Skin Skin rash/lesions?: No Itching?: No  Eyes Blurred vision?: No Double vision?: No  Ears/Nose/Throat Sore throat?: No Sinus problems?: No  Hematologic/Lymphatic Swollen glands?: No Easy bruising?: No  Cardiovascular Leg swelling?: No Chest pain?: No  Respiratory Cough?: No Shortness of breath?: No  Endocrine Excessive thirst?: No  Musculoskeletal Back pain?: No Joint pain?: No  Neurological Headaches?: No Dizziness?: No  Psychologic Depression?: No Anxiety?: No  Physical Exam: BP 126/83 (BP Location: Left Arm, Patient  Position: Sitting, Cuff Size: Large)   Pulse 89   Ht 5\' 3"  (1.6 m)   Wt 216 lb 3.2 oz (98.1 kg)   BMI 38.30 kg/m   Constitutional:  Alert and oriented, No acute distress. HEENT: [place] AT, moist mucus membranes.  Trachea midline, no masses. Cardiovascular: No clubbing, cyanosis, or edema. Respiratory: Normal respiratory effort, no increased work of breathing. Skin: No rashes, bruises or suspicious lesions. Neurologic: Grossly intact, no focal deficits, moving all 4 extremities. Psychiatric: Anxious appearing  Laboratory Data: Lab Results  Component Value Date   WBC 10.4 12/28/2014   HGB 14.8 12/28/2014   HCT 43.9 12/28/2014   MCV 97 12/28/2014   PLT 225 12/28/2014    Lab Results  Component Value Date   CREATININE 0.66 08/09/2015    Urinalysis n/a  Assessment & Plan:    1.  Bladder erythema Significant bladder erythema in the absence of infection with irritative voiding symptoms, strongly recommend biopsy as previously recommended by Dr. Pilar Jarvis We reviewed risks and benefits extensively She was encouraged to begin Uribel for irritative voiding symptoms in  addition to Myrbetriq We discussed hydrodistention following bladder biopsy which may help with post procedure reduction in discomfort I will call her with her pathology results and follow-up with her in 1 month following the procedure for further discussion of management of her urinary symptoms  Follow-up for cystoscopy, bladder biopsy  Hollice Espy, MD  [facility] [address] [place], [place] [address] [phone]

## 2018-05-31 DIAGNOSIS — M47812 Spondylosis without myelopathy or radiculopathy, cervical region: Secondary | ICD-10-CM | POA: Diagnosis not present

## 2018-05-31 DIAGNOSIS — S161XXA Strain of muscle, fascia and tendon at neck level, initial encounter: Secondary | ICD-10-CM | POA: Diagnosis not present

## 2018-06-04 ENCOUNTER — Ambulatory Visit: Payer: 59 | Admitting: Urology

## 2018-06-06 ENCOUNTER — Ambulatory Visit (INDEPENDENT_AMBULATORY_CARE_PROVIDER_SITE_OTHER): Payer: 59 | Admitting: Urology

## 2018-06-06 ENCOUNTER — Encounter: Payer: Self-pay | Admitting: Urology

## 2018-06-06 VITALS — BP 125/79 | HR 87 | Ht 63.0 in | Wt 210.0 lb

## 2018-06-06 DIAGNOSIS — N3289 Other specified disorders of bladder: Secondary | ICD-10-CM | POA: Diagnosis not present

## 2018-06-06 DIAGNOSIS — N3010 Interstitial cystitis (chronic) without hematuria: Secondary | ICD-10-CM

## 2018-06-06 DIAGNOSIS — R350 Frequency of micturition: Secondary | ICD-10-CM

## 2018-06-06 NOTE — Progress Notes (Signed)
06/06/2018 2:24 PM   Lynn Dennis February 06, 1961 [id number]  Referring provider: Derinda Late, MD [phone] [facility] [place], [place] [address]  Chief Complaint  Patient presents with  . Follow-up    HPI: 57 year old female with history of interstitial cystitis and bladder status post bladder biopsy and hydrodistention earlier today for routine follow-up.  Bladder biopsy on 03/20/2018 consistent with acute on chronic cystitis with urothelial denudation and reactive changes.  There was negative for any carcinoma in situ.  Following the biopsy with hydrodistention, she is done actually quite well.  She has been following an IC diet and downloaded an app on her phone to help her more carefully track her foods.  When she avoids these foods, she has minimal to no urinary symptoms.  She has been taking oxybutynin as needed.  She never took the Myrbetriq as this was not needed.  She does have the samples at home just in case.  Today she denies any dysuria, gross hematuria, or any other significant urinary symptoms.  She is overall pleased.   PMH: Past Medical History:  Diagnosis Date  . Arthritis    neck  . Cancer (HCC)    squamous cell skin cancer  . GERD (gastroesophageal reflux disease)    only time is when lap band is loosened  . History of hiatal hernia    fixed with lap band surgery  . Pneumonia 2014  . Sleep apnea    uses cpap    Surgical History: Past Surgical History:  Procedure Laterality Date  . ABDOMINAL HYSTERECTOMY    . CYSTO WITH HYDRODISTENSION N/A 03/20/2018   Procedure: CYSTOSCOPY/HYDRODISTENSION;  Surgeon: Hollice Espy, MD;  Location: [facility];  Service: Urology;  Laterality: N/A;  . CYSTOSCOPY WITH BIOPSY N/A 03/20/2018   Procedure: CYSTOSCOPY WITH Bladder BIOPSY;  Surgeon: Hollice Espy, MD;  Location: [facility];  Service: Urology;  Laterality: N/A;  . CYSTOSCOPY WITH FULGERATION N/A 03/20/2018   Procedure: CYSTOSCOPY WITH FULGERATION;  Surgeon: Hollice Espy, MD;  Location: [facility];  Service: Urology;  Laterality: N/A;  . FRACTURE SURGERY     fractured finger  . HEEL SPUR SURGERY Right   . HERNIA REPAIR     age 42 inguinal   . KNEE ARTHROSCOPY Left   . LAPAROSCOPIC GASTRIC BANDING    . SHOULDER ARTHROSCOPY WITH DEBRIDEMENT AND BICEP TENDON REPAIR Right 08/12/2015   Procedure: SHOULDER ARTHROSCOPY WITH DEBRIDMENT, DECOMPRESSION, SLAP REPAIR, MINI OPEN BICEPS TENDONESIS;  Surgeon: Corky Mull, MD;  Location: [facility];  Service: Orthopedics;  Laterality: Right;  . TONSILLECTOMY      Home Medications:  Allergies as of 06/06/2018      Reactions   Adhesive [tape] Other (See Comments)   Skin discomfort with extended use   Gluten Meal    Gluten intolerant   Lactose Intolerance (gi) Other (See Comments)   Gi upset   Nsaids Other (See Comments)   Patient has lap band      Medication List        Accurate as of 06/06/18 11:59 PM. Always use your most recent med list.          acetaminophen 500 MG tablet Commonly known as:  TYLENOL Take 1,000 mg by mouth every 6 (six) hours as needed for moderate pain or headache.   AZO URINARY PAIN 97.5 MG Tabs Generic drug:  Phenazopyridine HCl Take 1 tablet by mouth at bedtime as needed (for urinary pain relief).  carboxymethylcellulose 0.5 % Soln Commonly known as:  REFRESH PLUS Place 1 drop into both eyes at bedtime.   cimetidine 200 MG tablet Commonly known as:  TAGAMET Take 200 mg by mouth daily as needed (allergies).   docusate sodium 100 MG capsule Commonly known as:  COLACE Take 1 capsule (100 mg total) by mouth 2 (two) times daily.   estradiol 1 MG tablet Commonly known as:  ESTRACE Take 1 mg by mouth daily.   Estradiol 10 MCG Tabs vaginal tablet Place 10 mcg vaginally 2 (two) times a week.   FISH OIL PO Take 1 tablet by mouth daily.   fluticasone 50 MCG/ACT nasal spray Commonly known as:  FLONASE Place 1 spray  into both nostrils daily as needed for allergies or rhinitis.   MULTI-VITAMINS Tabs Take 1 tablet by mouth daily.   PRELIEF PO Take 1 tablet by mouth 2 (two) times daily as needed (prior to eating highly acidic foods).   triamcinolone cream 0.1 % Commonly known as:  KENALOG Apply 1 application topically daily as needed (rash).   URIBEL 118 MG Caps Take 118 mg by mouth 4 (four) times daily as needed (urinary pain).       Allergies:  Allergies  Allergen Reactions  . Adhesive [Tape] Other (See Comments)    Skin discomfort with extended use  . Gluten Meal     Gluten intolerant  . Lactose Intolerance (Gi) Other (See Comments)    Gi upset  . Nsaids Other (See Comments)    Patient has lap band    Family History: Family History  Problem Relation Age of Onset  . Breast cancer Neg Hx   . Bladder Cancer Neg Hx   . Kidney cancer Neg Hx     Social History:  reports that she has never smoked. She has never used smokeless tobacco. She reports that she drinks alcohol. She reports that she does not use drugs.  ROS: UROLOGY Frequent Urination?: Yes Hard to postpone urination?: No Burning/pain with urination?: No Get up at night to urinate?: No Leakage of urine?: No Urine stream starts and stops?: No Trouble starting stream?: No Do you have to strain to urinate?: No Blood in urine?: No Urinary tract infection?: No Sexually transmitted disease?: No Injury to kidneys or bladder?: No Painful intercourse?: No Weak stream?: No Currently pregnant?: No Vaginal bleeding?: No Last menstrual period?: n  Gastrointestinal Nausea?: No Vomiting?: No Indigestion/heartburn?: No Diarrhea?: No Constipation?: No  Constitutional Fever: No Night sweats?: No Weight loss?: No Fatigue?: No  Skin Skin rash/lesions?: No Itching?: No  Eyes Blurred vision?: No Double vision?: No  Ears/Nose/Throat Sinus problems?: No  Hematologic/Lymphatic Swollen glands?: No Easy bruising?:  No  Cardiovascular Leg swelling?: No Chest pain?: No  Respiratory Cough?: No Shortness of breath?: No  Endocrine Excessive thirst?: No  Musculoskeletal Back pain?: No Joint pain?: No  Neurological Headaches?: No Dizziness?: No  Psychologic Depression?: No Anxiety?: No  Physical Exam: BP 125/79   Pulse 87   Ht 5\' 3"  (1.6 m)   Wt 210 lb (95.3 kg)   BMI 37.20 kg/m   Constitutional:  Alert and oriented, No acute distress. HEENT: [place] AT, moist mucus membranes.  Trachea midline, no masses. Cardiovascular: No clubbing, cyanosis, or edema. Respiratory: Normal respiratory effort, no increased work of breathing. Skin: No rashes, bruises or suspicious lesions. Neurologic: Grossly intact, no focal deficits, moving all 4 extremities. Psychiatric: Normal mood and affect.  Laboratory Data: DIAGNOSIS:  A. BLADDER ERYTHEMA; BIOPSY:  -  ACUTE AND CHRONIC CYSTITIS WITH UROTHELIAL DENUDATION AND REACTIVE  CHANGES.  - NEGATIVE FOR CARCINOMA IN SITU.   Urinalysis N/a  Pertinent Imaging: n/a  Assessment & Plan:    1. Interstitial cystitis Did well status post hydrodistention Following strict IC diet We discussed alternatives if symptoms worsen including addition of alternative medications, rescue solution, repeat hydrodistention She is agreeable this plan  2. Erythematous bladder mucosa Status post biopsy, no malignancy identified  3. Urinary frequency Improved with dietary changes above Using oxybutynin as needed She may continue to use this medication or try Myrbetriq as previously given   Return in about 1 year (around 06/07/2019) for UA/ urine cytology/ symptoms check.  Hollice Espy, MD  [facility] [address] [place], [place] [address] [phone]

## 2018-07-02 ENCOUNTER — Other Ambulatory Visit: Payer: Self-pay

## 2018-07-02 ENCOUNTER — Encounter: Payer: Self-pay | Admitting: Physical Therapy

## 2018-07-02 ENCOUNTER — Ambulatory Visit: Payer: 59 | Attending: Surgery | Admitting: Physical Therapy

## 2018-07-02 DIAGNOSIS — M542 Cervicalgia: Secondary | ICD-10-CM | POA: Diagnosis present

## 2018-07-02 DIAGNOSIS — M6281 Muscle weakness (generalized): Secondary | ICD-10-CM

## 2018-07-02 DIAGNOSIS — M62838 Other muscle spasm: Secondary | ICD-10-CM

## 2018-07-02 NOTE — Therapy (Signed)
[facility] [address] [place], [address] Phone: [phone]   Fax:  [phone]  Physical Therapy Evaluation  Patient Details  Name: Lynn Dennis MRN: [id number] Date of Birth: Jul 06, 1961 Referring Provider: Corky Mull MD   Encounter Date: 07/02/2018  PT End of Session - 07/02/18 1800    Visit Number  1    Number of Visits  12    Date for PT Re-Evaluation  08/13/18    PT Start Time  1620    PT Stop Time  1720    PT Time Calculation (min)  60 min    Activity Tolerance  Patient tolerated treatment well    Behavior During Therapy  [facility] for tasks assessed/performed       Past Medical History:  Diagnosis Date  . Arthritis    neck  . Cancer (HCC)    squamous cell skin cancer  . GERD (gastroesophageal reflux disease)    only time is when lap band is loosened  . History of hiatal hernia    fixed with lap band surgery  . Pneumonia 2014  . Sleep apnea    uses cpap    Past Surgical History:  Procedure Laterality Date  . ABDOMINAL HYSTERECTOMY    . CYSTO WITH HYDRODISTENSION N/A 03/20/2018   Procedure: CYSTOSCOPY/HYDRODISTENSION;  Surgeon: Hollice Espy, MD;  Location: [facility];  Service: Urology;  Laterality: N/A;  . CYSTOSCOPY WITH BIOPSY N/A 03/20/2018   Procedure: CYSTOSCOPY WITH Bladder BIOPSY;  Surgeon: Hollice Espy, MD;  Location: [facility];  Service: Urology;  Laterality: N/A;  . CYSTOSCOPY WITH FULGERATION N/A 03/20/2018   Procedure: CYSTOSCOPY WITH FULGERATION;  Surgeon: Hollice Espy, MD;  Location: [facility];  Service: Urology;  Laterality: N/A;  . FRACTURE SURGERY     fractured finger  . HEEL SPUR SURGERY Right   . HERNIA REPAIR     age 52 inguinal   . KNEE ARTHROSCOPY Left   . LAPAROSCOPIC GASTRIC BANDING    . SHOULDER ARTHROSCOPY WITH DEBRIDEMENT AND BICEP TENDON REPAIR Right 08/12/2015   Procedure: SHOULDER ARTHROSCOPY WITH DEBRIDMENT, DECOMPRESSION, SLAP REPAIR, MINI OPEN  BICEPS TENDONESIS;  Surgeon: Corky Mull, MD;  Location: [facility];  Service: Orthopedics;  Laterality: Right;  . TONSILLECTOMY      There were no vitals filed for this visit.   Subjective Assessment - 07/02/18 1641    Subjective  Patient reports she is having popping and grinding in her neck with rotations and bending forward and back     Pertinent History  Patient reports that about a month ago she was trying to help her mother get up from the floor after a fall and when her mother pushed backwards as she was trying to get up, the patient lost her balance, fell backwards and hit her head, back left side, against the edge of a table. Her chief concern is she has increased "grinding feeling"  with neck motions. She has history of right shoulder surgery that did very well. She reports no history of neck problems before this incident.     Limitations  Reading;Other (comment) turning and bending head    Diagnostic tests  X-rays/MRI/Lab data: AP, lateral, and oblique views of the cervical spine: per patient chart: These films demonstrate moderate degenerative changes, primarily involving C5-6 with narrowing of the disc space and facet arthropathy. There is no evidence for spondylolysis or spondylolisthesis. There is mild narrowing of the left C5-6 foramen due  to osteophytes. No fractures or lytic lesions are identified.    Patient Stated Goals  Patient would like to stop grinding and pain in her neck     Currently in Pain?  Yes    Pain Score  5     Pain Location  Neck    Pain Orientation  Left;Posterior    Pain Descriptors / Indicators  Aching;Spasm    Pain Type  Surgical pain    Pain Onset  More than a month ago    Pain Frequency  Intermittent    Effect of Pain on Daily Activities  limits full function due to sudden pain with certain movements and patient not sure of when these symptoms will occur         [facility] PT Assessment - 07/02/18 1654      Assessment   Medical Diagnosis  Strain of  neck muscle initial encounter; Spondylosis of cervical region without myelopathy or radiculopathy    Referring Provider  Corky Mull MD    Onset Date/Surgical Date  05/11/18    Hand Dominance  Right    Prior Therapy  none for this episode      Balance Screen   Has the patient fallen in the past 6 months  Yes    How many times?  1    Has the patient had a decrease in activity level because of a fear of falling?   No    Is the patient reluctant to leave their home because of a fear of falling?   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      Cognition   Overall Cognitive Status  Within Functional Limits for tasks assessed         Palpation:  Mild tenderness along the cervical spine, general  tenderness to palpation sub occipital muscles with decreased soft tissue mobility cervical spine paraspinal muscles right    AROM: Cervical spine Flexion: 45 Extension: 50 Left/Right rotation: 50/60 Left/Right side bend: 25/25   Special tests: negative Spurling's test bilaterally; compression and distratction negative bilaterally  Strength: UEs:  Right Left  Shoulder flexion 5/5 5/5  Shoulder abduction 5/5 5/5  External/Internal rotation 5/5 5/5  Biceps 5/5 5/5  Triceps 5/5 5/5  Wrist Extensor 5/5 5/5  Wrist Flexor 5/5 5/5    Objective measurements completed on examination: See above findings.    Treatment:  Manual Therapy: 15 min  STM with suboccipital release performed with patient supine lying superficial techniques and compression techniques to decrease pain/spasms and improve cervical spine motion with less crepitus  Therapeutic exercise: patient performed with instruction, verbal and tactile cues of therapist: goal: independent with home program,  improve FOTO Scapular retraction sitting, standing  Patient response to treatment: patient demonstrated improved technique with exercises with minimal VC for correct alignment. Patient with decreased pain from 5/10 to  3/10. Patient with decreased spasms by 30% following STM.       PT Education - 07/02/18 1700    Education provided  Yes    Education Details  POC; use of cervical traction; exercises for posture control, awareness    Person(s) Educated  Patient    Methods  Explanation;Verbal cues;Handout;Demonstration  Comprehension  Verbalized understanding;Returned demonstration;Verbal cues required          PT Long Term Goals - 07/02/18 1833      PT LONG TERM GOAL #1   Title  Patient will demonstrate improved function with daily tasks with less difficutly and pain in cervical spine by FOTO score of 52/100    Baseline  FOTO 47/100 07/02/18    Status  New    Target Date  07/23/18      PT LONG TERM GOAL #2   Title  Patient will demonstrate improved function with daily tasks with less difficutly and pain in cervical spine by FOTO score of 63/100    Baseline  FOTO 47/100 07/02/18    Status  New    Target Date  08/14/18      PT LONG TERM GOAL #3   Title  Patient will report pain level max of 2/10 in cervical spine with mild to no grinding with rotations and bending of head to allow improved funciton with dailyt tasks    Baseline  pain level max 5/10     Status  New    Target Date  08/14/18      PT LONG TERM GOAL #4   Title  Patient will be independent with home program for pain control improving flexibility and strength in order to continue with self management/return to prior level of function     Baseline  limited knowledge of appropriate pain control strategies and progression of exercises without guidance and cuing    Status  New    Target Date  08/14/18             Plan - 07/02/18 1715    Clinical Impression Statement  Patient is a 57 year old female who  presents with cervical spine pain and spasms that limit full function with cervical spine flexion and rotations without difficulty. Her FOTO score of 47/100 indicates moderate self perceived impairment. She has limited knowledge of appropriate pain control strategies, exercise or progression and will benefit from physical therapy interveniton to achieve goals.     Clinical Presentation  Stable    Clinical Decision Making  Low    Rehab Potential  Good    PT Frequency  2x / week    PT Duration  6 weeks    PT Treatment/Interventions  Therapeutic exercise;Moist Heat;Electrical Stimulation;Cryotherapy;Patient/family education;Manual techniques;Ultrasound;Traction;Iontophoresis 4mg /ml Dexamethasone;Neuromuscular re-education;Dry needling    PT Next Visit Plan  pain control, manual STM, electrical stimulation     PT Home Exercise Plan  scapular retraction, cervical spine retraction, posture awareness    Consulted and Agree with Plan of Care  Patient       Patient will benefit from skilled therapeutic intervention in order to improve the following deficits and impairments:  Decreased strength, Pain, Impaired UE functional use, Decreased range of motion  Visit Diagnosis: Cervicalgia - Plan: PT plan of care cert/re-cert  Other muscle spasm - Plan: PT plan of care cert/re-cert  Muscle weakness (generalized) - Plan: PT plan of care cert/re-cert     Problem List There are no active problems to display for this patient.   Jomarie Longs PT 07/03/2018, 10:50 PM  [facility] [address] [place], [address] Phone: [phone]   Fax:  [phone]  Name: Lynn Dennis MRN: [id number] Date of Birth: [birth year]

## 2018-07-04 ENCOUNTER — Ambulatory Visit: Payer: 59 | Admitting: Physical Therapy

## 2018-07-04 ENCOUNTER — Encounter: Payer: Self-pay | Admitting: Physical Therapy

## 2018-07-04 DIAGNOSIS — M62838 Other muscle spasm: Secondary | ICD-10-CM

## 2018-07-04 DIAGNOSIS — M6281 Muscle weakness (generalized): Secondary | ICD-10-CM

## 2018-07-04 DIAGNOSIS — M542 Cervicalgia: Secondary | ICD-10-CM | POA: Diagnosis not present

## 2018-07-05 NOTE — Therapy (Signed)
[place] [facility] [address] [place], [address] Phone: [phone]   Fax:  [phone]  Physical Therapy Treatment  Patient Details  Name: Lynn Dennis MRN: [id number] Date of Birth: [birth year] Referring Provider: Corky Mull MD   Encounter Date: 07/04/2018  PT End of Session - 07/04/18 1624    Visit Number  2    Number of Visits  12    Date for PT Re-Evaluation  08/13/18    PT Start Time  1610    PT Stop Time  1650    PT Time Calculation (min)  40 min    Activity Tolerance  Patient tolerated treatment well    Behavior During Therapy  [facility] for tasks assessed/performed       Past Medical History:  Diagnosis Date  . Arthritis    neck  . Cancer (HCC)    squamous cell skin cancer  . GERD (gastroesophageal reflux disease)    only time is when lap band is loosened  . History of hiatal hernia    fixed with lap band surgery  . Pneumonia 2014  . Sleep apnea    uses cpap    Past Surgical History:  Procedure Laterality Date  . ABDOMINAL HYSTERECTOMY    . CYSTO WITH HYDRODISTENSION N/A 03/20/2018   Procedure: CYSTOSCOPY/HYDRODISTENSION;  Surgeon: Hollice Espy, MD;  Location: [facility];  Service: Urology;  Laterality: N/A;  . CYSTOSCOPY WITH BIOPSY N/A 03/20/2018   Procedure: CYSTOSCOPY WITH Bladder BIOPSY;  Surgeon: Hollice Espy, MD;  Location: [facility];  Service: Urology;  Laterality: N/A;  . CYSTOSCOPY WITH FULGERATION N/A 03/20/2018   Procedure: CYSTOSCOPY WITH FULGERATION;  Surgeon: Hollice Espy, MD;  Location: [facility];  Service: Urology;  Laterality: N/A;  . FRACTURE SURGERY     fractured finger  . HEEL SPUR SURGERY Right   . HERNIA REPAIR     age 61 inguinal   . KNEE ARTHROSCOPY Left   . LAPAROSCOPIC GASTRIC BANDING    . SHOULDER ARTHROSCOPY WITH DEBRIDEMENT AND BICEP TENDON REPAIR Right 08/12/2015   Procedure: SHOULDER ARTHROSCOPY WITH DEBRIDMENT, DECOMPRESSION, SLAP REPAIR, MINI OPEN  BICEPS TENDONESIS;  Surgeon: Corky Mull, MD;  Location: [facility];  Service: Orthopedics;  Laterality: Right;  . TONSILLECTOMY      There were no vitals filed for this visit.  Subjective Assessment - 07/04/18 1623    Subjective  Patient reports she is having popping and grinding in her neck with rotations and bending forward and back and this morning she had a bad headache that woke her up.     Pertinent History  Patient reports that about a month ago she was trying to help her mother get up from the floor after a fall and when her mother pushed backwards as she was trying to get up, the patient lost her balance, fell backwards and hit her head, back left side, against the edge of a table. Her chief concern is she has increased "grinding feeling"  with neck motions. She has history of right shoulder surgery that did very well. She reports no history of neck problems before this incident.     Limitations  Reading;Other (comment) turning and bending head    Diagnostic tests  X-rays/MRI/Lab data: AP, lateral, and oblique views of the cervical spine: per patient chart: These films demonstrate moderate degenerative changes, primarily involving C5-6 with narrowing of the disc space and facet arthropathy. There is no evidence for spondylolysis or  spondylolisthesis. There is mild narrowing of the left C5-6 foramen due to osteophytes. No fractures or lytic lesions are identified.    Patient Stated Goals  Patient would like to stop grinding and pain in her neck     Currently in Pain?  Yes    Pain Score  7     Pain Location  Neck    Pain Orientation  Left;Posterior    Pain Descriptors / Indicators  Aching;Spasm    Pain Type  Acute pain    Pain Onset  More than a month ago    Pain Frequency  Intermittent        Objective: Palpation: point tender left posterior aspect cervical spine/suboccipital region and along paraspinal muscles  Treatment: Manual therapy: 13 min STM performed to cervical spine  with patient seated in chair, bilateral upper trapezius muscles and paraspinal muscles superficial techniques: manual cervical traction performed with patient seated 10 reps with hold 5-10 seconds each repetition. Goal: pain, improve ROM cervical spine  Therapeutic exercise:  Re assessed HEP for posture awareness, use of racquetballs for self managing pain, spasms with demonstration of technique  Scapular retraction performed with VC and demonstration 10 reps  Modalities:  Ultrasound 10 min 1MHz continuous 1.4w/cm2 to cervical spine bilaterally into upper trapezius muscles with patient seated with UE's supported: goal: pain, spasms  Patient response to treatment: patient demonstrated good understanding of use of cervical traction device, racquetballs for STM and HEP with minimal VC. Patient with decreased pain from 7 /10 to 5/10. Patient with decreased spasms by 50% following treatment.       PT Education - 07/04/18 1700    Education provided  Yes    Education Details  re assesed HEP and use of home cervical retraction device and use of racquetballs for self STM/mobilization    Person(s) Educated  Patient    Methods  Explanation;Demonstration;Verbal cues    Comprehension  Verbalized understanding;Returned demonstration;Verbal cues required          PT Long Term Goals - 07/02/18 1833      PT LONG TERM GOAL #1   Title  Patient will demonstrate improved function with daily tasks with less difficutly and pain in cervical spine by FOTO score of 52/100    Baseline  FOTO 47/100 07/02/18    Status  New    Target Date  07/23/18      PT LONG TERM GOAL #2   Title  Patient will demonstrate improved function with daily tasks with less difficutly and pain in cervical spine by FOTO score of 63/100    Baseline  FOTO 47/100 07/02/18    Status  New    Target Date  08/14/18      PT LONG TERM GOAL #3   Title  Patient will report pain level max of 2/10 in cervical spine with mild to no grinding  with rotations and bending of head to allow improved funciton with dailyt tasks    Baseline  pain level max 5/10     Status  New    Target Date  08/14/18      PT LONG TERM GOAL #4   Title  Patient will be independent with home program for pain control improving flexibility and strength in order to continue with self management/return to prior level of function     Baseline  limited knowledge of appropriate pain control strategies and progression of exercises without guidance and cuing    Status  New  Target Date  08/14/18            Plan - 07/04/18 1655    Clinical Impression Statement  Patient demonstrated decreased spasms, pain with treatment and verbalized good understanding of use of cervical traction and self STM with racqhetballs. She should continue to improve pain, spasms and function with additional physical therapy intervention.     Rehab Potential  Good    PT Frequency  2x / week    PT Duration  6 weeks    PT Treatment/Interventions  Therapeutic exercise;Moist Heat;Electrical Stimulation;Cryotherapy;Patient/family education;Manual techniques;Ultrasound;Traction;Iontophoresis 4mg /ml Dexamethasone;Neuromuscular re-education;Dry needling    PT Next Visit Plan  pain control, manual STM, electrical stimulation     PT Home Exercise Plan  scapular retraction, cervical spine retraction, posture awareness       Patient will benefit from skilled therapeutic intervention in order to improve the following deficits and impairments:  Decreased strength, Pain, Impaired UE functional use, Decreased range of motion  Visit Diagnosis: Cervicalgia  Other muscle spasm  Muscle weakness (generalized)     Problem List There are no active problems to display for this patient.   Jomarie Longs PT 07/05/2018, 9:22 AM  [place] [facility] [address] [place], [address] Phone: [phone]   Fax:  [phone]  Name:  Lynn Dennis MRN: [id number] Date of Birth: [birth year]

## 2018-07-09 ENCOUNTER — Encounter: Payer: Self-pay | Admitting: Physical Therapy

## 2018-07-09 ENCOUNTER — Ambulatory Visit: Payer: 59 | Admitting: Physical Therapy

## 2018-07-09 DIAGNOSIS — M6281 Muscle weakness (generalized): Secondary | ICD-10-CM

## 2018-07-09 DIAGNOSIS — M542 Cervicalgia: Secondary | ICD-10-CM | POA: Diagnosis not present

## 2018-07-09 DIAGNOSIS — M62838 Other muscle spasm: Secondary | ICD-10-CM

## 2018-07-09 NOTE — Therapy (Signed)
[place] [facility] [address] [place], [address] Phone: [phone]   Fax:  [phone]  Physical Therapy Treatment  Patient Details  Name: Lynn Dennis MRN: [id number] Date of Birth: [birth year] Referring Provider: Corky Mull MD   Encounter Date: 07/09/2018  PT End of Session - 07/09/18 1531    Visit Number  3    Number of Visits  12    Date for PT Re-Evaluation  08/13/18    PT Start Time  1518    PT Stop Time  1603    PT Time Calculation (min)  45 min    Activity Tolerance  Patient tolerated treatment well    Behavior During Therapy  [facility] for tasks assessed/performed       Past Medical History:  Diagnosis Date  . Arthritis    neck  . Cancer (HCC)    squamous cell skin cancer  . GERD (gastroesophageal reflux disease)    only time is when lap band is loosened  . History of hiatal hernia    fixed with lap band surgery  . Pneumonia 2014  . Sleep apnea    uses cpap    Past Surgical History:  Procedure Laterality Date  . ABDOMINAL HYSTERECTOMY    . CYSTO WITH HYDRODISTENSION N/A 03/20/2018   Procedure: CYSTOSCOPY/HYDRODISTENSION;  Surgeon: Hollice Espy, MD;  Location: [facility];  Service: Urology;  Laterality: N/A;  . CYSTOSCOPY WITH BIOPSY N/A 03/20/2018   Procedure: CYSTOSCOPY WITH Bladder BIOPSY;  Surgeon: Hollice Espy, MD;  Location: [facility];  Service: Urology;  Laterality: N/A;  . CYSTOSCOPY WITH FULGERATION N/A 03/20/2018   Procedure: CYSTOSCOPY WITH FULGERATION;  Surgeon: Hollice Espy, MD;  Location: [facility];  Service: Urology;  Laterality: N/A;  . FRACTURE SURGERY     fractured finger  . HEEL SPUR SURGERY Right   . HERNIA REPAIR     age 21 inguinal   . KNEE ARTHROSCOPY Left   . LAPAROSCOPIC GASTRIC BANDING    . SHOULDER ARTHROSCOPY WITH DEBRIDEMENT AND BICEP TENDON REPAIR Right 08/12/2015   Procedure: SHOULDER ARTHROSCOPY WITH DEBRIDMENT, DECOMPRESSION, SLAP REPAIR, MINI OPEN  BICEPS TENDONESIS;  Surgeon: Corky Mull, MD;  Location: [facility];  Service: Orthopedics;  Laterality: Right;  . TONSILLECTOMY      There were no vitals filed for this visit.  Subjective Assessment - 07/09/18 1528    Subjective  Patient reports she is using cervical traction device with good results of decreasing symptoms.     Pertinent History  Patient reports that about a month ago she was trying to help her mother get up from the floor after a fall and when her mother pushed backwards as she was trying to get up, the patient lost her balance, fell backwards and hit her head, back left side, against the edge of a table. Her chief concern is she has increased "grinding feeling"  with neck motions. She has history of right shoulder surgery that did very well. She reports no history of neck problems before this incident.     Limitations  Reading;Other (comment) turning and bending head    Diagnostic tests  X-rays/MRI/Lab data: AP, lateral, and oblique views of the cervical spine: per patient chart: These films demonstrate moderate degenerative changes, primarily involving C5-6 with narrowing of the disc space and facet arthropathy. There is no evidence for spondylolysis or spondylolisthesis. There is mild narrowing of the left C5-6 foramen due to osteophytes. No fractures or  lytic lesions are identified.    Patient Stated Goals  Patient would like to stop grinding and pain in her neck     Currently in Pain?  Yes    Pain Score  5     Pain Location  Neck    Pain Orientation  Left;Posterior    Pain Descriptors / Indicators  Aching;Spasm;Other (Comment) popping in neck with turning head    Pain Onset  More than a month ago    Pain Frequency  Intermittent         Objective: Palpation: point tender left posterior aspect cervical spine/suboccipital region and along paraspinal muscles   Treatment: Manual therapy: 15 min STM performed to cervical spine with patient seated in chair, bilateral  upper trapezius muscles and paraspinal muscles superficial techniques: manual cervical traction performed with patient seated 10 reps with hold 5-10 seconds each repetition. Goal: pain, improve ROM cervical spine   Therapeutic exercise:  Re assessed HEP for posture awareness, use of racquetballs for self managing pain, spasms with demonstration of technique  Cervical spine retraction x 10 Upper thoracic spine extension demonstrated and performed by patient with repetition and repeated VC 10 reps   Modalities:  Ultrasound with HVGS combination: 10 min 1MHz continuous 1.4w/cm2 and HVGS intensity to tolerance to cervical spine bilaterally into upper trapezius muscles with patient seated with UE's supported: goal: pain, spasms   Patient response to treatment:  Patient with decreased pain from 5 /10 to 3/10. Patient with decreased spasms by 50% following treatment. improved technique with exercises with repeated demonstration and VC              PT Education - 07/09/18 1922    Education provided  Yes    Education Details  re assessed HEP    Person(s) Educated  Patient    Methods  Explanation    Comprehension  Verbalized understanding          PT Long Term Goals - 07/02/18 1833      PT LONG TERM GOAL #1   Title  Patient will demonstrate improved function with daily tasks with less difficutly and pain in cervical spine by FOTO score of 52/100    Baseline  FOTO 47/100 07/02/18    Status  New    Target Date  07/23/18      PT LONG TERM GOAL #2   Title  Patient will demonstrate improved function with daily tasks with less difficutly and pain in cervical spine by FOTO score of 63/100    Baseline  FOTO 47/100 07/02/18    Status  New    Target Date  08/14/18      PT LONG TERM GOAL #3   Title  Patient will report pain level max of 2/10 in cervical spine with mild to no grinding with rotations and bending of head to allow improved funciton with dailyt tasks    Baseline  pain level  max 5/10     Status  New    Target Date  08/14/18      PT LONG TERM GOAL #4   Title  Patient will be independent with home program for pain control improving flexibility and strength in order to continue with self management/return to prior level of function     Baseline  limited knowledge of appropriate pain control strategies and progression of exercises without guidance and cuing    Status  New    Target Date  08/14/18  Plan - 07/09/18 1609    Clinical Impression Statement  Patient demonstrated improved spasms, decreased pain following treatment and demonstrated good understanding of posture awareness, use of cervical traction device and exercises for home program. She will continue to benefit from physical therapy intervention  to achieve goals.     Rehab Potential  Good    PT Frequency  2x / week    PT Duration  6 weeks    PT Treatment/Interventions  Therapeutic exercise;Moist Heat;Electrical Stimulation;Cryotherapy;Patient/family education;Manual techniques;Ultrasound;Traction;Iontophoresis 4mg /ml Dexamethasone;Neuromuscular re-education;Dry needling    PT Next Visit Plan  pain control, manual STM, electrical stimulation     PT Home Exercise Plan  scapular retraction, cervical spine retraction, posture awareness       Patient will benefit from skilled therapeutic intervention in order to improve the following deficits and impairments:  Decreased strength, Pain, Impaired UE functional use, Decreased range of motion  Visit Diagnosis: Cervicalgia  Other muscle spasm  Muscle weakness (generalized)     Problem List There are no active problems to display for this patient.   Jomarie Longs PT 07/10/2018, 7:56 AM  [place] [facility] [address] [place], [address] Phone: [phone]   Fax:  [phone]  Name: Lynn Dennis MRN: [id number] Date of Birth: 1961-08-01

## 2018-07-11 ENCOUNTER — Encounter: Payer: Self-pay | Admitting: Physical Therapy

## 2018-07-11 ENCOUNTER — Ambulatory Visit: Payer: 59 | Attending: Surgery | Admitting: Physical Therapy

## 2018-07-11 DIAGNOSIS — M62838 Other muscle spasm: Secondary | ICD-10-CM | POA: Insufficient documentation

## 2018-07-11 DIAGNOSIS — M6281 Muscle weakness (generalized): Secondary | ICD-10-CM | POA: Diagnosis not present

## 2018-07-11 DIAGNOSIS — M542 Cervicalgia: Secondary | ICD-10-CM | POA: Insufficient documentation

## 2018-07-11 NOTE — Therapy (Signed)
[place] [facility] [address], [place], [address] Phone: [phone]   Fax:  [phone]  Physical Therapy Treatment  Patient Details  Name: Lynn Dennis MRN: [id number] Date of Birth: 1961-01-29 Referring Provider: Corky Mull MD   Encounter Date: 07/11/2018  PT End of Session - 07/11/18 1918    Visit Number  4    Number of Visits  12    Date for PT Re-Evaluation  08/13/18    PT Start Time  6962    PT Stop Time  1827    PT Time Calculation (min)  49 min    Activity Tolerance  Patient tolerated treatment well    Behavior During Therapy  [facility] for tasks assessed/performed       Past Medical History:  Diagnosis Date  . Arthritis    neck  . Cancer (HCC)    squamous cell skin cancer  . GERD (gastroesophageal reflux disease)    only time is when lap band is loosened  . History of hiatal hernia    fixed with lap band surgery  . Pneumonia 2014  . Sleep apnea    uses cpap    Past Surgical History:  Procedure Laterality Date  . ABDOMINAL HYSTERECTOMY    . CYSTO WITH HYDRODISTENSION N/A 03/20/2018   Procedure: CYSTOSCOPY/HYDRODISTENSION;  Surgeon: Hollice Espy, MD;  Location: [facility];  Service: Urology;  Laterality: N/A;  . CYSTOSCOPY WITH BIOPSY N/A 03/20/2018   Procedure: CYSTOSCOPY WITH Bladder BIOPSY;  Surgeon: Hollice Espy, MD;  Location: [facility];  Service: Urology;  Laterality: N/A;  . CYSTOSCOPY WITH FULGERATION N/A 03/20/2018   Procedure: CYSTOSCOPY WITH FULGERATION;  Surgeon: Hollice Espy, MD;  Location: [facility];  Service: Urology;  Laterality: N/A;  . FRACTURE SURGERY     fractured finger  . HEEL SPUR SURGERY Right   . HERNIA REPAIR     age 41 inguinal   . KNEE ARTHROSCOPY Left   . LAPAROSCOPIC GASTRIC BANDING    . SHOULDER ARTHROSCOPY WITH DEBRIDEMENT AND BICEP TENDON REPAIR Right 08/12/2015   Procedure: SHOULDER ARTHROSCOPY WITH DEBRIDMENT, DECOMPRESSION, SLAP REPAIR, MINI OPEN BICEPS  TENDONESIS;  Surgeon: Corky Mull, MD;  Location: [facility];  Service: Orthopedics;  Laterality: Right;  . TONSILLECTOMY      There were no vitals filed for this visit.  Subjective Assessment - 07/11/18 1741    Subjective  Patient reports she is having pain in neck and feels some of this is from swimming breast stroke. She also is reporting headache from back of skull to above eyes.    Pertinent History  Patient reports that about a month ago she was trying to help her mother get up from the floor after a fall and when her mother pushed backwards as she was trying to get up, the patient lost her balance, fell backwards and hit her head, back left side, against the edge of a table. Her chief concern is she has increased "grinding feeling"  with neck motions. She has history of right shoulder surgery that did very well. She reports no history of neck problems before this incident.     Limitations  Reading;Other (comment) turning and bending head    Diagnostic tests  X-rays/MRI/Lab data: AP, lateral, and oblique views of the cervical spine: per patient chart: These films demonstrate moderate degenerative changes, primarily involving C5-6 with narrowing of the disc space and facet arthropathy. There is no evidence for spondylolysis or spondylolisthesis.  There is mild narrowing of the left C5-6 foramen due to osteophytes. No fractures or lytic lesions are identified.    Patient Stated Goals  Patient would like to stop grinding and pain in her neck     Currently in Pain?  Yes    Pain Score  7     Pain Location  Neck    Pain Orientation  Left;Right;Posterior;Anterior    Pain Descriptors / Indicators  Aching;Spasm    Pain Type  Acute pain    Pain Onset  More than a month ago    Pain Frequency  Intermittent            Objective: Palpation: point tender left posterior aspect cervical spine/suboccipital region and along paraspinal muscles; TrP with spasm palpable along bilateral SCMs, levator  scapulae and upper trapezius muscles   Treatment: Manual therapy: 38 min STM performed to cervical spine with patient seated in chair, bilateral upper trapezius muscles and paraspinal muscles superficial techniques: suboccipital release in supine; levator scapula stretch 3 x 10 seconds with myofascial stretch performed to each side; manual cervical traction performed with patient supine 10 reps with hold 5-10 seconds each repetition. Goal: pain, improve ROM cervical spine Upper thoracic spine extension  performed by patient with repetition and minimal VC 5 reps  TDN performed (no charge: 5 min)  to (2) TrP's in bilateral upper trapezius/levator muscles with patient prone: .25 x 16mm needle used for technique; mild twitch response noted each side, no adverse reactions noted    Patient response to treatment:  Patient with decreased pain from 7/10 to <2/10. Patient with decreased spasms by > 50% following treatment.       PT Education - 07/11/18 1909    Education provided  Yes    Education Details  Dry needling information given; precautions; possible reactions including bruising, nausea, pain/aching, soreness following treatment; precautions given for DN over lumg fields including pneumothorax; patient gave verbal consent to intervention    Person(s) Educated  Patient    Methods  Explanation    Comprehension  Verbalized understanding;Other (comment) verbal consent given for DN intervention          PT Long Term Goals - 07/02/18 1833      PT LONG TERM GOAL #1   Title  Patient will demonstrate improved function with daily tasks with less difficutly and pain in cervical spine by FOTO score of 52/100    Baseline  FOTO 47/100 07/02/18    Status  New    Target Date  07/23/18      PT LONG TERM GOAL #2   Title  Patient will demonstrate improved function with daily tasks with less difficutly and pain in cervical spine by FOTO score of 63/100    Baseline  FOTO 47/100 07/02/18    Status  New     Target Date  08/14/18      PT LONG TERM GOAL #3   Title  Patient will report pain level max of 2/10 in cervical spine with mild to no grinding with rotations and bending of head to allow improved funciton with dailyt tasks    Baseline  pain level max 5/10     Status  New    Target Date  08/14/18      PT LONG TERM GOAL #4   Title  Patient will be independent with home program for pain control improving flexibility and strength in order to continue with self management/return to prior level of function  Baseline  limited knowledge of appropriate pain control strategies and progression of exercises without guidance and cuing    Status  New    Target Date  08/14/18            Plan - 07/11/18 1918    Clinical Impression Statement  Patient responded well to treatment with decreased headache to 0/10 and neck pain to <2/10. No adverse effects noted with TDN. Patient continues with intermittent pain in cervical spine, spasms that will benefit from continued physical therapy intervention.     Rehab Potential  Good    PT Frequency  2x / week    PT Duration  6 weeks    PT Treatment/Interventions  Therapeutic exercise;Moist Heat;Electrical Stimulation;Cryotherapy;Patient/family education;Manual techniques;Ultrasound;Traction;Iontophoresis 4mg /ml Dexamethasone;Neuromuscular re-education;Dry needling    PT Next Visit Plan  pain control, manual STM, electrical stimulation     PT Home Exercise Plan  scapular retraction, cervical spine retraction, posture awareness    Consulted and Agree with Plan of Care  Patient       Patient will benefit from skilled therapeutic intervention in order to improve the following deficits and impairments:  Decreased strength, Pain, Impaired UE functional use, Decreased range of motion  Visit Diagnosis: Cervicalgia  Other muscle spasm  Muscle weakness (generalized)     Problem List There are no active problems to display for this patient.   Jomarie Longs PT 07/11/2018, 7:21 PM  [place] [facility] [address], [place], [address] Phone: [phone]   Fax:  [phone]  Name: Lynn Dennis MRN: [id number] Date of Birth: Apr 02, 1961

## 2018-07-16 ENCOUNTER — Ambulatory Visit: Payer: 59 | Admitting: Physical Therapy

## 2018-07-16 ENCOUNTER — Encounter: Payer: Self-pay | Admitting: Physical Therapy

## 2018-07-16 DIAGNOSIS — M542 Cervicalgia: Secondary | ICD-10-CM | POA: Diagnosis not present

## 2018-07-16 DIAGNOSIS — M6281 Muscle weakness (generalized): Secondary | ICD-10-CM

## 2018-07-16 DIAGNOSIS — M62838 Other muscle spasm: Secondary | ICD-10-CM

## 2018-07-16 NOTE — Therapy (Signed)
[place] [facility] [address], [place], [address] Phone: [phone]   Fax:  [phone]  Physical Therapy Treatment  Patient Details  Name: Lynn Dennis MRN: [id number] Date of Birth: 25-May-1961 Referring Provider: Corky Mull MD   Encounter Date: 07/16/2018  PT End of Session - 07/16/18 1540    Visit Number  5    Number of Visits  12    Date for PT Re-Evaluation  08/13/18    PT Start Time  1034    PT Stop Time  1115    PT Time Calculation (min)  41 min    Activity Tolerance  Patient tolerated treatment well    Behavior During Therapy  [facility] for tasks assessed/performed       Past Medical History:  Diagnosis Date  . Arthritis    neck  . Cancer (HCC)    squamous cell skin cancer  . GERD (gastroesophageal reflux disease)    only time is when lap band is loosened  . History of hiatal hernia    fixed with lap band surgery  . Pneumonia 2014  . Sleep apnea    uses cpap    Past Surgical History:  Procedure Laterality Date  . ABDOMINAL HYSTERECTOMY    . CYSTO WITH HYDRODISTENSION N/A 03/20/2018   Procedure: CYSTOSCOPY/HYDRODISTENSION;  Surgeon: Hollice Espy, MD;  Location: [facility];  Service: Urology;  Laterality: N/A;  . CYSTOSCOPY WITH BIOPSY N/A 03/20/2018   Procedure: CYSTOSCOPY WITH Bladder BIOPSY;  Surgeon: Hollice Espy, MD;  Location: [facility];  Service: Urology;  Laterality: N/A;  . CYSTOSCOPY WITH FULGERATION N/A 03/20/2018   Procedure: CYSTOSCOPY WITH FULGERATION;  Surgeon: Hollice Espy, MD;  Location: [facility];  Service: Urology;  Laterality: N/A;  . FRACTURE SURGERY     fractured finger  . HEEL SPUR SURGERY Right   . HERNIA REPAIR     age 58 inguinal   . KNEE ARTHROSCOPY Left   . LAPAROSCOPIC GASTRIC BANDING    . SHOULDER ARTHROSCOPY WITH DEBRIDEMENT AND BICEP TENDON REPAIR Right 08/12/2015   Procedure: SHOULDER ARTHROSCOPY WITH DEBRIDMENT, DECOMPRESSION, SLAP REPAIR, MINI OPEN BICEPS  TENDONESIS;  Surgeon: Corky Mull, MD;  Location: [facility];  Service: Orthopedics;  Laterality: Right;  . TONSILLECTOMY      There were no vitals filed for this visit.  Subjective Assessment - 07/16/18 1040    Subjective  Patient reports she did well following previous treatment with improved flexibility in neck the next day. She continiues with intermittent spasms in neck with swimming certain strokes.    Pertinent History  Patient reports that about a month ago she was trying to help her mother get up from the floor after a fall and when her mother pushed backwards as she was trying to get up, the patient lost her balance, fell backwards and hit her head, back left side, against the edge of a table. Her chief concern is she has increased "grinding feeling"  with neck motions. She has history of right shoulder surgery that did very well. She reports no history of neck problems before this incident.     Limitations  Reading;Other (comment) turning and bending head    Diagnostic tests  X-rays/MRI/Lab data: AP, lateral, and oblique views of the cervical spine: per patient chart: These films demonstrate moderate degenerative changes, primarily involving C5-6 with narrowing of the disc space and facet arthropathy. There is no evidence for spondylolysis or spondylolisthesis. There is mild  narrowing of the left C5-6 foramen due to osteophytes. No fractures or lytic lesions are identified.    Patient Stated Goals  Patient would like to stop grinding and pain in her neck     Currently in Pain?  Yes    Pain Score  4     Pain Location  Neck    Pain Orientation  Right;Left;Posterior    Pain Descriptors / Indicators  Aching    Pain Type  Acute pain    Pain Onset  More than a month ago    Pain Frequency  Intermittent           Objective: Palpation: point tender left posterior aspect cervical spine/suboccipital region and along paraspinal muscles; TrP with spasm palpable along bilateral levator  scapulae and upper trapezius muscles   Treatment: Manual therapy: 30 min STM performed to cervical spine bilateral upper trapezius muscles and paraspinal muscles superficial techniques: suboccipital release in supine, myofascial stretch performed to upper trapezius muscles to each side; manual cervical traction performed with patient supine 10 reps with hold 5-10 seconds each repetition. Goal: pain, improve ROM cervical spine  Therapeutic exercise: patient performed with verbal cues, instruction of therapist: goal: pain, independent with home program Upper thoracic spine extension  performed by patient with repetition and minimal VC 5 reps Isometric lateral flexion cervical spine 5 seconds, 3 reps each side levator scapula stretch 3 x 10 each side  Upper trapezius strength 3 x 10-15 seconds each side Isometric cervical spine extension x 5 reps 5 second holds with repeated instruction, VC   TDN performed (no charge: 3 min)  to TrP's in bilateral upper trapezius/levator muscles with patient prone: .25 x 37mm needle used for technique; mild twitch response noted each side, no adverse reactions noted    Patient response to treatment:  Patient with decreased pain from 4/10 to <2/10. Patient with decreased spasms by > 50% following treatment.           PT Education - 07/16/18 1043    Education provided  Yes    Education Details  Dry needling information given; precautions; possible reactions including bruising, nausea, pain/aching, soreness following treatment; precautions given for DN over lumg fields including pneumothorax; patient gave verbal consent to intervention     Person(s) Educated  Patient    Methods  Explanation    Comprehension  Verbalized understanding          PT Long Term Goals - 07/02/18 1833      PT LONG TERM GOAL #1   Title  Patient will demonstrate improved function with daily tasks with less difficutly and pain in cervical spine by FOTO score of 52/100     Baseline  FOTO 47/100 07/02/18    Status  New    Target Date  07/23/18      PT LONG TERM GOAL #2   Title  Patient will demonstrate improved function with daily tasks with less difficutly and pain in cervical spine by FOTO score of 63/100    Baseline  FOTO 47/100 07/02/18    Status  New    Target Date  08/14/18      PT LONG TERM GOAL #3   Title  Patient will report pain level max of 2/10 in cervical spine with mild to no grinding with rotations and bending of head to allow improved funciton with dailyt tasks    Baseline  pain level max 5/10     Status  New    Target Date  08/14/18      PT LONG TERM GOAL #4   Title  Patient will be independent with home program for pain control improving flexibility and strength in order to continue with self management/return to prior level of function     Baseline  limited knowledge of appropriate pain control strategies and progression of exercises without guidance and cuing    Status  New    Target Date  08/14/18            Plan - 07/16/18 1116    Clinical Impression Statement  Patient demonstrates improvement with decreasing pain in cervical spine, decreasing spasms. She is progressing well towards goals and should continue to progress with additional physical therapy intervention.     Rehab Potential  Good    PT Frequency  2x / week    PT Duration  6 weeks    PT Treatment/Interventions  Therapeutic exercise;Moist Heat;Electrical Stimulation;Cryotherapy;Patient/family education;Manual techniques;Ultrasound;Traction;Iontophoresis 4mg /ml Dexamethasone;Neuromuscular re-education;Dry needling    PT Next Visit Plan  pain control, manual STM, electrical stimulation     PT Home Exercise Plan  scapular retraction, cervical spine retraction, posture awareness    Consulted and Agree with Plan of Care  Patient       Patient will benefit from skilled therapeutic intervention in order to improve the following deficits and impairments:  Decreased  strength, Pain, Impaired UE functional use, Decreased range of motion  Visit Diagnosis: Cervicalgia  Other muscle spasm  Muscle weakness (generalized)     Problem List There are no active problems to display for this patient.   Jomarie Longs PT 07/16/2018, 3:42 PM  [place] [facility] [address] [place], [address] Phone: [phone]   Fax:  [phone]  Name: Lynn Dennis MRN: [id number] Date of Birth: 1961/09/07

## 2018-07-18 ENCOUNTER — Ambulatory Visit: Payer: 59 | Admitting: Physical Therapy

## 2018-07-18 ENCOUNTER — Encounter: Payer: Self-pay | Admitting: Physical Therapy

## 2018-07-18 DIAGNOSIS — M542 Cervicalgia: Secondary | ICD-10-CM | POA: Diagnosis not present

## 2018-07-18 DIAGNOSIS — M6281 Muscle weakness (generalized): Secondary | ICD-10-CM

## 2018-07-18 DIAGNOSIS — M62838 Other muscle spasm: Secondary | ICD-10-CM

## 2018-07-18 NOTE — Therapy (Signed)
[place] [facility] [address], [place], [address] Phone: [phone]   Fax:  [phone]  Physical Therapy Treatment  Patient Details  Name: Lynn Dennis MRN: [id number] Date of Birth: May 27, 1961 Referring Provider: Corky Mull MD   Encounter Date: 07/18/2018  PT End of Session - 07/18/18 1102    Visit Number  6    Number of Visits  12    Date for PT Re-Evaluation  08/13/18    PT Start Time  1042    PT Stop Time  1125    PT Time Calculation (min)  43 min    Activity Tolerance  Patient tolerated treatment well    Behavior During Therapy  [facility] for tasks assessed/performed       Past Medical History:  Diagnosis Date  . Arthritis    neck  . Cancer (HCC)    squamous cell skin cancer  . GERD (gastroesophageal reflux disease)    only time is when lap band is loosened  . History of hiatal hernia    fixed with lap band surgery  . Pneumonia 2014  . Sleep apnea    uses cpap    Past Surgical History:  Procedure Laterality Date  . ABDOMINAL HYSTERECTOMY    . CYSTO WITH HYDRODISTENSION N/A 03/20/2018   Procedure: CYSTOSCOPY/HYDRODISTENSION;  Surgeon: Hollice Espy, MD;  Location: [facility];  Service: Urology;  Laterality: N/A;  . CYSTOSCOPY WITH BIOPSY N/A 03/20/2018   Procedure: CYSTOSCOPY WITH Bladder BIOPSY;  Surgeon: Hollice Espy, MD;  Location: [facility];  Service: Urology;  Laterality: N/A;  . CYSTOSCOPY WITH FULGERATION N/A 03/20/2018   Procedure: CYSTOSCOPY WITH FULGERATION;  Surgeon: Hollice Espy, MD;  Location: [facility];  Service: Urology;  Laterality: N/A;  . FRACTURE SURGERY     fractured finger  . HEEL SPUR SURGERY Right   . HERNIA REPAIR     age 28 inguinal   . KNEE ARTHROSCOPY Left   . LAPAROSCOPIC GASTRIC BANDING    . SHOULDER ARTHROSCOPY WITH DEBRIDEMENT AND BICEP TENDON REPAIR Right 08/12/2015   Procedure: SHOULDER ARTHROSCOPY WITH DEBRIDMENT, DECOMPRESSION, SLAP REPAIR, MINI OPEN BICEPS  TENDONESIS;  Surgeon: Corky Mull, MD;  Location: [facility];  Service: Orthopedics;  Laterality: Right;  . TONSILLECTOMY      There were no vitals filed for this visit.  Subjective Assessment - 07/18/18 1059    Subjective  Patient reports she did better following previous session and went swimming again with less difficutly. She continues with pain in neck that limits full function without difficulty. She reports she is improving with current treatment.    Pertinent History  Patient reports that about a month ago she was trying to help her mother get up from the floor after a fall and when her mother pushed backwards as she was trying to get up, the patient lost her balance, fell backwards and hit her head, back left side, against the edge of a table. Her chief concern is she has increased "grinding feeling"  with neck motions. She has history of right shoulder surgery that did very well. She reports no history of neck problems before this incident.     Limitations  Reading;Other (comment)   turning and bending head   Diagnostic tests  X-rays/MRI/Lab data: AP, lateral, and oblique views of the cervical spine: per patient chart: These films demonstrate moderate degenerative changes, primarily involving C5-6 with narrowing of the disc space and facet arthropathy. There is  no evidence for spondylolysis or spondylolisthesis. There is mild narrowing of the left C5-6 foramen due to osteophytes. No fractures or lytic lesions are identified.    Patient Stated Goals  Patient would like to stop grinding and pain in her neck     Currently in Pain?  Yes    Pain Score  3     Pain Location  Neck    Pain Orientation  Right;Left;Posterior    Pain Descriptors / Indicators  Aching    Pain Type  Acute pain    Pain Onset  More than a month ago    Pain Frequency  Intermittent       Objective: AROM: cervical spine side bend right 35; left 40 with reports of pulling on right side with left side bending; flexion  50/60 with pull in posterior spine; extension 60 degrees with discomfort reported at end range Palpation: point tender left and right posterior aspect cervical spine/suboccipital region; TrP with spasm palpable along bilateral levator scapulae and upper trapezius muscles, improved from previous session   Treatment: Manual therapy: 23 min STM performed to cervical spine paraspinal muscles superficial techniques: suboccipital release in supine, myofascial stretch performed to upper trapezius muscles to each side; manual cervical traction performed with patient supine 10 reps with hold 5-10 seconds each repetition. Goal: pain, improve ROM cervical spine   Therapeutic exercise: patient performed with verbal cues, instruction of therapist: goal: pain, independent with home program Upper thoracic spine extension  performed by patient with repetition x 5 reps in sitting Levator scapula stretch 3 x 10 each side  Upper trapezius stretch 3 x 10-15 seconds each side AROM cervical spine with demonstration and VC to perform with proper alignment and posture to improve ROM with less difficulty and pain  TDN performed (no charge: 3 min)  to TrP's in bilateral upper trapezius/levator muscles with patient prone: .25 x 9mm needle used for technique; mild twitch response noted each side, no adverse reactions noted    Patient response to treatment:  improved flexibility into side bending to left cervical spine following treatment. decreased palpable spasms and improved soft tissue elasticity by 50%           PT Education - 07/18/18 1153    Education provided  Yes    Education Details  exercise instruction; posture correction with ROM cervical spine: Dry needling information given; precautions; possible reactions including bruising, nausea, pain/aching, soreness following treatment; precautions given for DN over lumg fields including pneumothorax; patient gave verbal consent to intervention      Person(s)  Educated  Patient    Methods  Explanation;Demonstration    Comprehension  Verbalized understanding          PT Long Term Goals - 07/02/18 1833      PT LONG TERM GOAL #1   Title  Patient will demonstrate improved function with daily tasks with less difficutly and pain in cervical spine by FOTO score of 52/100    Baseline  FOTO 47/100 07/02/18    Status  New    Target Date  07/23/18      PT LONG TERM GOAL #2   Title  Patient will demonstrate improved function with daily tasks with less difficutly and pain in cervical spine by FOTO score of 63/100    Baseline  FOTO 47/100 07/02/18    Status  New    Target Date  08/14/18      PT LONG TERM GOAL #3   Title  Patient will  report pain level max of 2/10 in cervical spine with mild to no grinding with rotations and bending of head to allow improved funciton with dailyt tasks    Baseline  pain level max 5/10     Status  New    Target Date  08/14/18      PT LONG TERM GOAL #4   Title  Patient will be independent with home program for pain control improving flexibility and strength in order to continue with self management/return to prior level of function     Baseline  limited knowledge of appropriate pain control strategies and progression of exercises without guidance and cuing    Status  New    Target Date  08/14/18            Plan - 07/18/18 1103    Clinical Impression Statement  Patient demonstrates improvement with decreasing pain in cervical spine with decreasing spams, improving ROM and function with daily tasks with less difficulty. She continues with limitations with side bending to left and intermittent spasms in cervical spine and will benefit from additional physical therapy intervention.     Rehab Potential  Good    PT Frequency  2x / week    PT Duration  6 weeks    PT Treatment/Interventions  Therapeutic exercise;Moist Heat;Electrical Stimulation;Cryotherapy;Patient/family education;Manual  techniques;Ultrasound;Traction;Iontophoresis 4mg /ml Dexamethasone;Neuromuscular re-education;Dry needling    PT Next Visit Plan  pain control, manual STM, electrical stimulation     PT Home Exercise Plan  scapular retraction, cervical spine retraction, posture awareness    Consulted and Agree with Plan of Care  Patient       Patient will benefit from skilled therapeutic intervention in order to improve the following deficits and impairments:  Decreased strength, Pain, Impaired UE functional use, Decreased range of motion, Cardiopulmonary status limiting activity  Visit Diagnosis: Cervicalgia  Other muscle spasm  Muscle weakness (generalized)     Problem List There are no active problems to display for this patient.   Jomarie Longs PT 07/19/2018, 10:18 AM  [place] [facility] [address] [place], [address] Phone: [phone]   Fax:  [phone]  Name: Lynn Dennis MRN: [id number] Date of Birth: 05/20/1961

## 2018-07-22 ENCOUNTER — Encounter: Payer: 59 | Admitting: Physical Therapy

## 2018-07-23 ENCOUNTER — Encounter: Payer: Self-pay | Admitting: Physical Therapy

## 2018-07-23 ENCOUNTER — Ambulatory Visit: Payer: 59 | Admitting: Physical Therapy

## 2018-07-23 DIAGNOSIS — M62838 Other muscle spasm: Secondary | ICD-10-CM

## 2018-07-23 DIAGNOSIS — M542 Cervicalgia: Secondary | ICD-10-CM

## 2018-07-23 DIAGNOSIS — M6281 Muscle weakness (generalized): Secondary | ICD-10-CM

## 2018-07-23 NOTE — Therapy (Signed)
[facility] [address], [place], [address] Phone: [phone]   Fax:  [phone]  Physical Therapy Treatment  Patient Details  Name: Lynn Dennis MRN: [id number] Date of Birth: 1961-04-25 Referring Provider: Corky Mull MD   Encounter Date: 07/23/2018  PT End of Session - 07/23/18 1356    Visit Number  7    Number of Visits  12    Date for PT Re-Evaluation  08/13/18    Authorization Type  FOTO 7/6    PT Start Time  1346    PT Stop Time  1427    PT Time Calculation (min)  41 min    Activity Tolerance  Patient tolerated treatment well    Behavior During Therapy  [facility] for tasks assessed/performed       Past Medical History:  Diagnosis Date  . Arthritis    neck  . Cancer (HCC)    squamous cell skin cancer  . GERD (gastroesophageal reflux disease)    only time is when lap band is loosened  . History of hiatal hernia    fixed with lap band surgery  . Pneumonia 2014  . Sleep apnea    uses cpap    Past Surgical History:  Procedure Laterality Date  . ABDOMINAL HYSTERECTOMY    . CYSTO WITH HYDRODISTENSION N/A 03/20/2018   Procedure: CYSTOSCOPY/HYDRODISTENSION;  Surgeon: Hollice Espy, MD;  Location: [facility];  Service: Urology;  Laterality: N/A;  . CYSTOSCOPY WITH BIOPSY N/A 03/20/2018   Procedure: CYSTOSCOPY WITH Bladder BIOPSY;  Surgeon: Hollice Espy, MD;  Location: [facility];  Service: Urology;  Laterality: N/A;  . CYSTOSCOPY WITH FULGERATION N/A 03/20/2018   Procedure: CYSTOSCOPY WITH FULGERATION;  Surgeon: Hollice Espy, MD;  Location: [facility];  Service: Urology;  Laterality: N/A;  . FRACTURE SURGERY     fractured finger  . HEEL SPUR SURGERY Right   . HERNIA REPAIR     age 66 inguinal   . KNEE ARTHROSCOPY Left   . LAPAROSCOPIC GASTRIC BANDING    . SHOULDER ARTHROSCOPY WITH DEBRIDEMENT AND BICEP TENDON REPAIR Right 08/12/2015   Procedure: SHOULDER ARTHROSCOPY WITH DEBRIDMENT,  DECOMPRESSION, SLAP REPAIR, MINI OPEN BICEPS TENDONESIS;  Surgeon: Corky Mull, MD;  Location: [facility];  Service: Orthopedics;  Laterality: Right;  . TONSILLECTOMY      There were no vitals filed for this visit.  Subjective Assessment - 07/23/18 1352    Subjective  Patient reports she sees an increase in pain/stiffness in her neck the day following swimming. Today she reports stiffness left side of neck    Pertinent History  Patient reports that about a month ago she was trying to help her mother get up from the floor after a fall and when her mother pushed backwards as she was trying to get up, the patient lost her balance, fell backwards and hit her head, back left side, against the edge of a table. Her chief concern is she has increased "grinding feeling"  with neck motions. She has history of right shoulder surgery that did very well. She reports no history of neck problems before this incident.     Limitations  Reading;Other (comment)   turning and bending head   Diagnostic tests  X-rays/MRI/Lab data: AP, lateral, and oblique views of the cervical spine: per patient chart: These films demonstrate moderate degenerative changes, primarily involving C5-6 with narrowing of the disc space and facet arthropathy. There is no evidence for spondylolysis  or spondylolisthesis. There is mild narrowing of the left C5-6 foramen due to osteophytes. No fractures or lytic lesions are identified.    Patient Stated Goals  Patient would like to stop grinding and pain in her neck     Currently in Pain?  Yes    Pain Score  3     Pain Location  Neck    Pain Orientation  Right;Left;Posterior    Pain Descriptors / Indicators  Aching;Tightness    Pain Type  Acute pain    Pain Onset  More than a month ago    Pain Frequency  Intermittent          Objective: AROM: cervical spine side bend right 35; left 40 with reports of pulling on right side with left side bending Palpation: point tender left and right  posterior aspect cervical spine/suboccipital region   Treatment: Manual therapy: 23 min STM performed to cervical spine paraspinal muscles superficial techniques: suboccipital release in supine, myofascial stretch performed to upper trapezius muscles to each side; manual cervical traction performed with patient supine 10 reps with hold 5-10 seconds each repetition. Goal: pain, improve ROM cervical spine   Therapeutic exercise: patient performed with verbal cues, instruction of therapist: goal: pain, independent with home program supine: active assisted rotation right/left with hold end range  isometric cervical spine extension x 5 reps Upper trapezius stretch 3 x 10-15 seconds each side  standing scapular retraction and shoulder extension to thighs x 15 reps with blue resistive band with demonstration and moderate cuing to perform with correct technique  Patient response to treatment: improved pain free ROM cervical spine rotation. improved soft tissue elasticity with decresaed spasms by 30% following treatment      PT Education - 07/23/18 1355    Education provided  Yes    Person(s) Educated  Patient    Methods  Explanation;Demonstration    Comprehension  Verbalized understanding          PT Long Term Goals - 07/02/18 1833      PT LONG TERM GOAL #1   Title  Patient will demonstrate improved function with daily tasks with less difficutly and pain in cervical spine by FOTO score of 52/100    Baseline  FOTO 47/100 07/02/18    Status  New    Target Date  07/23/18      PT LONG TERM GOAL #2   Title  Patient will demonstrate improved function with daily tasks with less difficutly and pain in cervical spine by FOTO score of 63/100    Baseline  FOTO 47/100 07/02/18    Status  New    Target Date  08/14/18      PT LONG TERM GOAL #3   Title  Patient will report pain level max of 2/10 in cervical spine with mild to no grinding with rotations and bending of head to allow improved  funciton with dailyt tasks    Baseline  pain level max 5/10     Status  New    Target Date  08/14/18      PT LONG TERM GOAL #4   Title  Patient will be independent with home program for pain control improving flexibility and strength in order to continue with self management/return to prior level of function     Baseline  limited knowledge of appropriate pain control strategies and progression of exercises without guidance and cuing    Status  New    Target Date  08/14/18  Plan - 07/23/18 1356    Clinical Impression Statement  Patient demosntrates improvement with decreasing pain cervical spine with decreasing spasms snd improving ROM and function with daily tasks.     Rehab Potential  Good    PT Frequency  2x / week    PT Duration  6 weeks    PT Treatment/Interventions  Therapeutic exercise;Moist Heat;Electrical Stimulation;Cryotherapy;Patient/family education;Manual techniques;Ultrasound;Traction;Iontophoresis 4mg /ml Dexamethasone;Neuromuscular re-education;Dry needling    PT Next Visit Plan  FOTO re assessment; pain control, manual STM, electrical stimulation     PT Home Exercise Plan  scapular retraction, cervical spine retraction, posture awareness       Patient will benefit from skilled therapeutic intervention in order to improve the following deficits and impairments:  Decreased strength, Pain, Impaired UE functional use, Decreased range of motion, Cardiopulmonary status limiting activity  Visit Diagnosis: Cervicalgia  Other muscle spasm  Muscle weakness (generalized)     Problem List There are no active problems to display for this patient.   Jomarie Longs PT 07/23/2018, 9:31 PM  [facility] [address], [place], [address] Phone: [phone]   Fax:  [phone]  Name: Lynn Dennis MRN: [id number] Date of Birth: Jul 25, 1961

## 2018-07-24 ENCOUNTER — Ambulatory Visit: Payer: 59 | Admitting: Physical Therapy

## 2018-07-25 ENCOUNTER — Ambulatory Visit: Payer: 59 | Admitting: Physical Therapy

## 2018-07-25 ENCOUNTER — Encounter: Payer: Self-pay | Admitting: Physical Therapy

## 2018-07-25 DIAGNOSIS — M542 Cervicalgia: Secondary | ICD-10-CM | POA: Diagnosis not present

## 2018-07-25 DIAGNOSIS — M62838 Other muscle spasm: Secondary | ICD-10-CM

## 2018-07-25 DIAGNOSIS — M6281 Muscle weakness (generalized): Secondary | ICD-10-CM

## 2018-07-26 NOTE — Therapy (Signed)
[facility] [address] [place], [address] Phone: [phone]   Fax:  [phone]  Physical Therapy Treatment Discharge Summary  Patient Details  Name: Lynn Dennis MRN: [id number] Date of Birth: 03/23/1961 Referring Provider: Corky Mull MD   Encounter Date: 07/25/2018   Patient began physical therapy on 07/02/18 and has attended 8 sessions through 07/25/18. She has achieved goals #1,#2, #4 and partially and is independent in home program for continued self management of pain/symptoms and exercises as instructed. Plan discharge from physical therapy at this time.    PT End of Session - 07/25/18 1539    Visit Number  8    Number of Visits  12    Date for PT Re-Evaluation  08/13/18    Authorization Type  FOTO 7/6    PT Start Time  1522    PT Stop Time  1610    PT Time Calculation (min)  48 min    Activity Tolerance  Patient tolerated treatment well    Behavior During Therapy  [facility] for tasks assessed/performed       Past Medical History:  Diagnosis Date  . Arthritis    neck  . Cancer (HCC)    squamous cell skin cancer  . GERD (gastroesophageal reflux disease)    only time is when lap band is loosened  . History of hiatal hernia    fixed with lap band surgery  . Pneumonia 2014  . Sleep apnea    uses cpap    Past Surgical History:  Procedure Laterality Date  . ABDOMINAL HYSTERECTOMY    . CYSTO WITH HYDRODISTENSION N/A 03/20/2018   Procedure: CYSTOSCOPY/HYDRODISTENSION;  Surgeon: Hollice Espy, MD;  Location: [facility];  Service: Urology;  Laterality: N/A;  . CYSTOSCOPY WITH BIOPSY N/A 03/20/2018   Procedure: CYSTOSCOPY WITH Bladder BIOPSY;  Surgeon: Hollice Espy, MD;  Location: [facility];  Service: Urology;  Laterality: N/A;  . CYSTOSCOPY WITH FULGERATION N/A 03/20/2018   Procedure: CYSTOSCOPY WITH FULGERATION;  Surgeon: Hollice Espy, MD;  Location: [facility];  Service: Urology;  Laterality:  N/A;  . FRACTURE SURGERY     fractured finger  . HEEL SPUR SURGERY Right   . HERNIA REPAIR     age 57 inguinal   . KNEE ARTHROSCOPY Left   . LAPAROSCOPIC GASTRIC BANDING    . SHOULDER ARTHROSCOPY WITH DEBRIDEMENT AND BICEP TENDON REPAIR Right 08/12/2015   Procedure: SHOULDER ARTHROSCOPY WITH DEBRIDMENT, DECOMPRESSION, SLAP REPAIR, MINI OPEN BICEPS TENDONESIS;  Surgeon: Corky Mull, MD;  Location: [facility];  Service: Orthopedics;  Laterality: Right;  . TONSILLECTOMY      There were no vitals filed for this visit.  Subjective Assessment - 07/25/18 1528    Subjective  Patient reports she has seen improvement with decreasing pain/stiffness in her neck with treatment. She reports her pain level ranges from 0/10 at rest and up to 4/10 with movement/ popping. She reports less frequency of popping in her neck since beginning PT. She verbalized good understanding of self management strategies and HEP and agrees to discharge from PT at this time.      Pertinent History  Patient reports that about a month ago she was trying to help her mother get up from the floor after a fall and when her mother pushed backwards as she was trying to get up, the patient lost her balance, fell backwards and hit her head, back left side, against the edge of a  table. Her chief concern is she has increased "grinding feeling"  with neck motions. She has history of right shoulder surgery that did very well. She reports no history of neck problems before this incident.     Limitations  Reading;Other (comment)   turning and bending head   Diagnostic tests  X-rays/MRI/Lab data: AP, lateral, and oblique views of the cervical spine: per patient chart: These films demonstrate moderate degenerative changes, primarily involving C5-6 with narrowing of the disc space and facet arthropathy. There is no evidence for spondylolysis or spondylolisthesis. There is mild narrowing of the left C5-6 foramen due to osteophytes. No fractures or lytic  lesions are identified.    Patient Stated Goals  Patient would like to stop grinding and pain in her neck     Currently in Pain?  Yes    Pain Score  3     Pain Location  Neck    Pain Orientation  Right;Left;Posterior    Pain Descriptors / Indicators  Aching;Tightness    Pain Type  Acute pain    Pain Onset  More than a month ago    Pain Frequency  Intermittent           Objective: AROM: cervical spine side bend right 35; left 40 with reports of pulling on right side with left side bending Palpation: point tender left and right posterior aspect cervical spine/suboccipital region improved from previous session  Outcome measure: FOTO 63/100 ( initially 47/100; significant improvement)   Treatment: Manual therapy: 23 min STM performed to cervical spine paraspinal muscles superficial techniques: suboccipital release in supine, myofascial stretch performed to upper trapezius muscles to each side; manual cervical traction performed with patient supine 10 reps with hold 5-10 seconds each repetition. Goal: pain, improve ROM cervical spine  TDN performed (no charge: 3 min) to TrP's in bilateral upper trapezius/levator muscles with patient prone: .25 x 35m needle used for technique; mild twitch response noted each side, no adverse reactions noted     Therapeutic exercise: patient performed with verbal cues, instruction of therapist: goal: pain, independent with home program supine: active assisted rotation right/left with hold end range  isometric cervical spine extension x 5 reps medial and radial nerve gliding with demonstration and patient performing with minimal cuing 3x each UE   standing scapular retraction and shoulder extension to thighs x 15 reps with blue resistive band with demonstration and minimal cuing to perform with correct technique   Patient response to treatment: improved soft tissue elasticity with improved flexibility in cervical spine. Improved technique with  exercises with minimal cuing      PT Education - 07/25/18 1639    Education provided  Yes    Education Details  Dry needling information given; precautions; possible reactions including bruising, nausea, pain/aching, soreness following treatment; precautions given for DN over lumg fields including pneumothorax; patient gave verbal consent to intervention; median nerve and radial nerve gliding instruction given with visual and verbal cues      Person(s) Educated  Patient    Methods  Explanation;Demonstration;Verbal cues    Comprehension  Verbalized understanding;Returned demonstration;Verbal cues required          PT Long Term Goals - 07/25/18 1630      PT LONG TERM GOAL #1   Title  Patient will demonstrate improved function with daily tasks with less difficutly and pain in cervical spine by FOTO score of 52/100    Baseline  FOTO 47/100 07/02/18; FOTO 07/25/18: 63/100    Status  Achieved      PT LONG TERM GOAL #2   Title  Patient will demonstrate improved function with daily tasks with less difficutly and pain in cervical spine by FOTO score of 63/100    Baseline  FOTO 47/100 07/02/18; FOTO 07/25/18: 63/100    Status  Achieved      PT LONG TERM GOAL #3   Title  Patient will report pain level max of 2/10 in cervical spine with mild to no grinding with rotations and bending of head to allow improved funciton with dailyt tasks    Baseline  pain level max 5/10: max pain level 07/25/18 0- 2-4/10    Status  Partially Met      PT LONG TERM GOAL #4   Title  Patient will be independent with home program for pain control improving flexibility and strength in order to continue with self management/return to prior level of function     Baseline  limited knowledge of appropriate pain control strategies and progression of exercises without guidance and cuing; 07/25/18 patient demonstrates good understanding of HEP and self managment    Status  Achieved            Plan - 07/25/18 1630     Clinical Impression Statement  Patient has progressed well with physical therapy intervention and has achieved all goals. She is independent with home program for self management of symtpoms and HEP and should continue to improve.     Rehab Potential  Good    PT Frequency  2x / week    PT Duration  6 weeks    PT Treatment/Interventions  Therapeutic exercise;Moist Heat;Electrical Stimulation;Cryotherapy;Patient/family education;Manual techniques;Ultrasound;Traction;Iontophoresis '4mg'$ /ml Dexamethasone;Neuromuscular re-education;Dry needling    PT Next Visit Plan  diascharge    PT Home Exercise Plan  scapular retraction, cervical spine retraction, posture awareness    Consulted and Agree with Plan of Care  Patient       Patient will benefit from skilled therapeutic intervention in order to improve the following deficits and impairments:  Decreased strength, Pain, Impaired UE functional use, Decreased range of motion, Cardiopulmonary status limiting activity  Visit Diagnosis: Cervicalgia  Other muscle spasm  Muscle weakness (generalized)     Problem List There are no active problems to display for this patient.   Jomarie Longs PT 07/26/2018, 12:54 PM  [facility] [address] [place], [address] Phone: [phone]   Fax:  [phone]  Name: Lynn Dennis MRN: [id number] Date of Birth: Jul 22, 1961

## 2018-07-29 ENCOUNTER — Encounter: Payer: 59 | Admitting: Physical Therapy

## 2018-07-31 ENCOUNTER — Ambulatory Visit: Payer: 59 | Admitting: Physical Therapy

## 2018-08-01 ENCOUNTER — Ambulatory Visit: Payer: 59 | Admitting: Physical Therapy

## 2018-08-05 ENCOUNTER — Encounter: Payer: 59 | Admitting: Physical Therapy

## 2018-08-06 ENCOUNTER — Encounter: Payer: 59 | Admitting: Physical Therapy

## 2018-08-08 ENCOUNTER — Encounter: Payer: 59 | Admitting: Physical Therapy

## 2018-08-13 DIAGNOSIS — Z0000 Encounter for general adult medical examination without abnormal findings: Secondary | ICD-10-CM | POA: Diagnosis not present

## 2018-08-13 DIAGNOSIS — Z79890 Hormone replacement therapy (postmenopausal): Secondary | ICD-10-CM | POA: Diagnosis not present

## 2018-08-13 DIAGNOSIS — Z23 Encounter for immunization: Secondary | ICD-10-CM | POA: Diagnosis not present

## 2018-08-13 DIAGNOSIS — Z780 Asymptomatic menopausal state: Secondary | ICD-10-CM | POA: Diagnosis not present

## 2018-08-21 DIAGNOSIS — Z780 Asymptomatic menopausal state: Secondary | ICD-10-CM | POA: Diagnosis not present

## 2018-10-09 DIAGNOSIS — M7061 Trochanteric bursitis, right hip: Secondary | ICD-10-CM | POA: Diagnosis not present

## 2018-10-09 DIAGNOSIS — Z23 Encounter for immunization: Secondary | ICD-10-CM | POA: Diagnosis not present

## 2018-10-09 DIAGNOSIS — I83813 Varicose veins of bilateral lower extremities with pain: Secondary | ICD-10-CM | POA: Diagnosis not present

## 2018-10-29 ENCOUNTER — Encounter (INDEPENDENT_AMBULATORY_CARE_PROVIDER_SITE_OTHER): Payer: Self-pay | Admitting: Vascular Surgery

## 2018-11-06 ENCOUNTER — Encounter (INDEPENDENT_AMBULATORY_CARE_PROVIDER_SITE_OTHER): Payer: Self-pay | Admitting: Vascular Surgery

## 2018-11-06 ENCOUNTER — Ambulatory Visit (INDEPENDENT_AMBULATORY_CARE_PROVIDER_SITE_OTHER): Payer: 59 | Admitting: Vascular Surgery

## 2018-11-06 VITALS — BP 127/84 | HR 65 | Resp 17 | Ht 63.0 in | Wt 182.0 lb

## 2018-11-06 DIAGNOSIS — R600 Localized edema: Secondary | ICD-10-CM

## 2018-11-06 DIAGNOSIS — I83813 Varicose veins of bilateral lower extremities with pain: Secondary | ICD-10-CM | POA: Insufficient documentation

## 2018-11-06 NOTE — Progress Notes (Signed)
Subjective:    Patient ID: Lynn Dennis, female    DOB: [birth year], 57 y.o.   MRN: [id number] Chief Complaint  Patient presents with  . New Patient (Initial Visit)    Varicose veins   Presents as a new patient referred by Dr. Candiss Norse for evaluation of varicose veins.  The patient notes a long-standing history of varicosities located bilateral legs.  The patient has noticed a progressive worsening in the pain along her varicosities.  The patient also experiences edema which also worsens towards the end of the day.  The patient's discomfort and edema worsen with standing / sitting for long periods of time.  The patient denies any recent surgery or trauma to the bilateral legs.  Patient denies any claudication-like symptoms, rest pain or ulcer formation to the bilateral legs.  The patient does note that her edema does make her legs feel "weak".  The patient feels that her symptoms have progressed to the point that she is unable to function on daily basis and they become lifestyle limiting.  The patient does engage in conservative therapy including wearing medical grade 1 compression socks, elevating her legs and remaining active on a daily basis.  She notes that this provides minimal relief.  The patient denies any fever, nausea or vomiting.  Review of Systems  Constitutional: Negative.   HENT: Negative.   Eyes: Negative.   Respiratory: Negative.   Cardiovascular: Positive for leg swelling.       Painful varicose veins  Gastrointestinal: Negative.   Endocrine: Negative.   Genitourinary: Negative.   Musculoskeletal: Negative.   Skin: Negative.   Allergic/Immunologic: Negative.   Neurological: Negative.   Hematological: Negative.   Psychiatric/Behavioral: Negative.       Objective:   Physical Exam  Constitutional: She is oriented to person, place, and time. She appears well-developed and well-nourished. No distress.  HENT:  Head: Normocephalic and atraumatic.  Right Ear: External ear  normal.  Left Ear: External ear normal.  Eyes: Pupils are equal, round, and reactive to light. Conjunctivae and EOM are normal.  Neck: Normal range of motion.  Cardiovascular: Normal rate, regular rhythm, normal heart sounds and intact distal pulses.  Pulses:      Radial pulses are 2+ on the right side, and 2+ on the left side.       Dorsalis pedis pulses are 2+ on the right side, and 2+ on the left side.       Posterior tibial pulses are 2+ on the right side, and 2+ on the left side.  Pulmonary/Chest: Effort normal and breath sounds normal.  Musculoskeletal: Normal range of motion. She exhibits edema (Mild 1+ pitting edema noted bilaterally).  Neurological: She is alert and oriented to person, place, and time.  Skin: She is not diaphoretic.  Minimal less than 1 cm scattered varicosities to the bilateral legs.  There is no stasis dermatitis, fibrosis, cellulitis or active ulcerations noted at this time  Psychiatric: She has a normal mood and affect. Her behavior is normal. Judgment and thought content normal.  Vitals reviewed.  BP 127/84 (BP Location: Right Arm, Patient Position: Sitting)   Pulse 65   Resp 17   Ht 5\' 3"  (1.6 m)   Wt 182 lb (82.6 kg)   BMI 32.24 kg/m   Past Medical History:  Diagnosis Date  . Arthritis    neck  . Cancer (HCC)    squamous cell skin cancer  . GERD (gastroesophageal reflux disease)    only time  is when lap band is loosened  . History of hiatal hernia    fixed with lap band surgery  . Pneumonia 2014  . Sleep apnea    uses cpap   Social History   Socioeconomic History  . Marital status: Married    Spouse name: Not on file  . Number of children: Not on file  . Years of education: Not on file  . Highest education level: Not on file  Occupational History  . Not on file  Social Needs  . Financial resource strain: Not on file  . Food insecurity:    Worry: Not on file    Inability: Not on file  . Transportation needs:    Medical: Not on  file    Non-medical: Not on file  Tobacco Use  . Smoking status: Never Smoker  . Smokeless tobacco: Never Used  Substance and Sexual Activity  . Alcohol use: Yes    Comment: occ wine  . Drug use: No  . Sexual activity: Not on file  Lifestyle  . Physical activity:    Days per week: Not on file    Minutes per session: Not on file  . Stress: Not on file  Relationships  . Social connections:    Talks on phone: Not on file    Gets together: Not on file    Attends religious service: Not on file    Active member of club or organization: Not on file    Attends meetings of clubs or organizations: Not on file    Relationship status: Not on file  . Intimate partner violence:    Fear of current or ex partner: Not on file    Emotionally abused: Not on file    Physically abused: Not on file    Forced sexual activity: Not on file  Other Topics Concern  . Not on file  Social History Narrative  . Not on file   Past Surgical History:  Procedure Laterality Date  . ABDOMINAL HYSTERECTOMY    . CYSTO WITH HYDRODISTENSION N/A 03/20/2018   Procedure: CYSTOSCOPY/HYDRODISTENSION;  Surgeon: Hollice Espy, MD;  Location: [facility];  Service: Urology;  Laterality: N/A;  . CYSTOSCOPY WITH BIOPSY N/A 03/20/2018   Procedure: CYSTOSCOPY WITH Bladder BIOPSY;  Surgeon: Hollice Espy, MD;  Location: [facility];  Service: Urology;  Laterality: N/A;  . CYSTOSCOPY WITH FULGERATION N/A 03/20/2018   Procedure: CYSTOSCOPY WITH FULGERATION;  Surgeon: Hollice Espy, MD;  Location: [facility];  Service: Urology;  Laterality: N/A;  . FRACTURE SURGERY     fractured finger  . HEEL SPUR SURGERY Right   . HERNIA REPAIR     age 77 inguinal   . KNEE ARTHROSCOPY Left   . LAPAROSCOPIC GASTRIC BANDING    . SHOULDER ARTHROSCOPY WITH DEBRIDEMENT AND BICEP TENDON REPAIR Right 08/12/2015   Procedure: SHOULDER ARTHROSCOPY WITH DEBRIDMENT, DECOMPRESSION, SLAP REPAIR, MINI OPEN BICEPS TENDONESIS;  Surgeon: Corky Mull, MD;   Location: [facility];  Service: Orthopedics;  Laterality: Right;  . TONSILLECTOMY      Family History  Problem Relation Age of Onset  . Breast cancer Neg Hx   . Bladder Cancer Neg Hx   . Kidney cancer Neg Hx    Allergies  Allergen Reactions  . Prednisone Other (See Comments)    hallucinantions  . Adhesive [Tape] Other (See Comments)    Skin discomfort with extended use  . Gluten Meal     Gluten intolerant  . Lactose Intolerance (Gi) Other (See Comments)  Gi upset  . Nsaids Other (See Comments)    Patient has lap band      Assessment & Plan:  Presents as a new patient referred by Dr. Candiss Norse for evaluation of varicose veins.  The patient notes a long-standing history of varicosities located bilateral legs.  The patient has noticed a progressive worsening in the pain along her varicosities.  The patient also experiences edema which also worsens towards the end of the day.  The patient's discomfort and edema worsen with standing / sitting for long periods of time.  The patient denies any recent surgery or trauma to the bilateral legs.  Patient denies any claudication-like symptoms, rest pain or ulcer formation to the bilateral legs.  The patient does note that her edema does make her legs feel "weak".  The patient feels that her symptoms have progressed to the point that she is unable to function on daily basis and they become lifestyle limiting.  The patient does engage in conservative therapy including wearing medical grade 1 compression socks, elevating her legs and remaining active on a daily basis.  She notes that this provides minimal relief.  The patient denies any fever, nausea or vomiting.  1. Varicose veins of both lower extremities with pain - New The patient was encouraged to wear graduated compression stockings (20-30 mmHg) on a daily basis. The patient was instructed to begin wearing the stockings first thing in the morning and removing them in the evening. The patient was  instructed specifically not to sleep in the stockings. Prescription given. In addition, behavioral modification including elevation during the day will be initiated. Anti-inflammatories for pain. We will bring patient back in 3 months and have her undergo bilateral lower extremity venous duplex to rule out reflux versus lymphatic disease The patient will follow up in three months to asses conservative management.  Information on chronic venous insufficiency and compression stockings was given to the patient. The patient was instructed to call the office in the interim if any worsening edema or ulcerations to the legs, feet or toes occurs. The patient expresses their understanding.  - VAS [place] LOWER EXTREMITY VENOUS REFLUX; Future  2. Bilateral lower extremity edema - New As above  - VAS [place] LOWER EXTREMITY VENOUS REFLUX; Future  Current Outpatient Medications on File Prior to Visit  Medication Sig Dispense Refill  . acetaminophen (TYLENOL) 500 MG tablet Take 1,000 mg by mouth every 6 (six) hours as needed for moderate pain or headache.    . carboxymethylcellulose (REFRESH PLUS) 0.5 % SOLN Place 1 drop into both eyes at bedtime.     Marland Kitchen estradiol (ESTRACE) 1 MG tablet Take 1 mg by mouth daily.    . hydrOXYzine (ATARAX/VISTARIL) 10 MG tablet Take by mouth.    . metroNIDAZOLE (METROGEL) 1 % gel Apply topically.    . Multiple Vitamin (MULTI-VITAMINS) TABS Take 1 tablet by mouth daily.     . Omega-3 Fatty Acids (FISH OIL PO) Take 1 tablet by mouth daily.    . Phenazopyridine HCl (AZO URINARY PAIN) 97.5 MG TABS Take 1 tablet by mouth at bedtime as needed (for urinary pain relief).     . triamcinolone cream (KENALOG) 0.1 % Apply 1 application topically daily as needed (rash).     [name] 10 MCG TABS vaginal tablet      No current facility-administered medications on file prior to visit.    There are no Patient Instructions on file for this visit. No follow-ups on file.    A ,  PA-C

## 2018-11-23 DIAGNOSIS — J0100 Acute maxillary sinusitis, unspecified: Secondary | ICD-10-CM | POA: Diagnosis not present

## 2018-12-12 DIAGNOSIS — G4733 Obstructive sleep apnea (adult) (pediatric): Secondary | ICD-10-CM | POA: Diagnosis not present

## 2018-12-12 DIAGNOSIS — R05 Cough: Secondary | ICD-10-CM | POA: Diagnosis not present

## 2018-12-12 DIAGNOSIS — Z9989 Dependence on other enabling machines and devices: Secondary | ICD-10-CM | POA: Diagnosis not present

## 2018-12-30 DIAGNOSIS — M79604 Pain in right leg: Secondary | ICD-10-CM | POA: Diagnosis not present

## 2018-12-30 DIAGNOSIS — M47816 Spondylosis without myelopathy or radiculopathy, lumbar region: Secondary | ICD-10-CM | POA: Diagnosis not present

## 2018-12-30 DIAGNOSIS — S86911A Strain of unspecified muscle(s) and tendon(s) at lower leg level, right leg, initial encounter: Secondary | ICD-10-CM | POA: Diagnosis not present

## 2019-01-29 DIAGNOSIS — L570 Actinic keratosis: Secondary | ICD-10-CM | POA: Diagnosis not present

## 2019-01-29 DIAGNOSIS — L718 Other rosacea: Secondary | ICD-10-CM | POA: Diagnosis not present

## 2019-01-29 DIAGNOSIS — X32XXXA Exposure to sunlight, initial encounter: Secondary | ICD-10-CM | POA: Diagnosis not present

## 2019-02-06 ENCOUNTER — Ambulatory Visit (INDEPENDENT_AMBULATORY_CARE_PROVIDER_SITE_OTHER): Payer: 59 | Admitting: Vascular Surgery

## 2019-02-06 ENCOUNTER — Encounter (INDEPENDENT_AMBULATORY_CARE_PROVIDER_SITE_OTHER): Payer: 59

## 2019-02-13 ENCOUNTER — Ambulatory Visit (INDEPENDENT_AMBULATORY_CARE_PROVIDER_SITE_OTHER): Payer: 59 | Admitting: Vascular Surgery

## 2019-02-13 ENCOUNTER — Encounter (INDEPENDENT_AMBULATORY_CARE_PROVIDER_SITE_OTHER): Payer: 59

## 2019-02-27 ENCOUNTER — Encounter (INDEPENDENT_AMBULATORY_CARE_PROVIDER_SITE_OTHER): Payer: Self-pay | Admitting: Vascular Surgery

## 2019-02-27 ENCOUNTER — Ambulatory Visit (INDEPENDENT_AMBULATORY_CARE_PROVIDER_SITE_OTHER): Payer: 59

## 2019-02-27 ENCOUNTER — Other Ambulatory Visit: Payer: Self-pay

## 2019-02-27 ENCOUNTER — Ambulatory Visit (INDEPENDENT_AMBULATORY_CARE_PROVIDER_SITE_OTHER): Payer: 59 | Admitting: Vascular Surgery

## 2019-02-27 VITALS — BP 136/84 | HR 72 | Resp 16 | Wt 171.0 lb

## 2019-02-27 DIAGNOSIS — R600 Localized edema: Secondary | ICD-10-CM | POA: Diagnosis not present

## 2019-02-27 DIAGNOSIS — I83813 Varicose veins of bilateral lower extremities with pain: Secondary | ICD-10-CM | POA: Diagnosis not present

## 2019-02-27 NOTE — Progress Notes (Signed)
MRN : [id number]  Lynn Dennis is a 58 y.o. (03/18/1961) female who presents with chief complaint of  Chief Complaint  Patient presents with  . Follow-up    ultrasound follow up  .  History of Present Illness: The patient returns for followup evaluation 3 months after the initial visit. The patient continues to have pain in the lower extremities with dependency. The pain is lessened with elevation. Graduated compression stockings, Class I (20-30 mmHg), have been worn but the stockings do not eliminate the leg pain. Over-the-counter analgesics do not improve the symptoms. The degree of discomfort continues to interfere with daily activities. The patient notes the pain in the legs is causing problems with daily exercise, at the workplace and even with household activities and maintenance such as standing in the kitchen preparing meals and doing dishes.   Venous ultrasound shows normal deep venous system, no evidence of acute or chronic DVT.  Superficial reflux is present in the right GSV  Current Meds  Medication Sig  . acetaminophen (TYLENOL) 500 MG tablet Take 1,000 mg by mouth every 6 (six) hours as needed for moderate pain or headache.  . busPIRone (BUSPAR) 7.5 MG tablet   . carboxymethylcellulose (REFRESH PLUS) 0.5 % SOLN Place 1 drop into both eyes at bedtime.   Marland Kitchen estradiol (ESTRACE) 1 MG tablet Take 1 mg by mouth daily.  . hydrOXYzine (ATARAX/VISTARIL) 10 MG tablet Take by mouth.  . metroNIDAZOLE (METROGEL) 1 % gel Apply topically.  . Multiple Vitamin (MULTI-VITAMINS) TABS Take 1 tablet by mouth daily.   . Omega-3 Fatty Acids (FISH OIL PO) Take 1 tablet by mouth daily.  . Phenazopyridine HCl (AZO URINARY PAIN) 97.5 MG TABS Take 1 tablet by mouth at bedtime as needed (for urinary pain relief).   . triamcinolone cream (KENALOG) 0.1 % Apply 1 application topically daily as needed (rash).   [name] 10 MCG TABS vaginal tablet     Past Medical History:  Diagnosis Date  .  Arthritis    neck  . Cancer (HCC)    squamous cell skin cancer  . GERD (gastroesophageal reflux disease)    only time is when lap band is loosened  . History of hiatal hernia    fixed with lap band surgery  . Pneumonia 2014  . Sleep apnea    uses cpap    Past Surgical History:  Procedure Laterality Date  . ABDOMINAL HYSTERECTOMY    . CYSTO WITH HYDRODISTENSION N/A 03/20/2018   Procedure: CYSTOSCOPY/HYDRODISTENSION;  Surgeon: Hollice Espy, MD;  Location: [facility];  Service: Urology;  Laterality: N/A;  . CYSTOSCOPY WITH BIOPSY N/A 03/20/2018   Procedure: CYSTOSCOPY WITH Bladder BIOPSY;  Surgeon: Hollice Espy, MD;  Location: [facility];  Service: Urology;  Laterality: N/A;  . CYSTOSCOPY WITH FULGERATION N/A 03/20/2018   Procedure: CYSTOSCOPY WITH FULGERATION;  Surgeon: Hollice Espy, MD;  Location: [facility];  Service: Urology;  Laterality: N/A;  . FRACTURE SURGERY     fractured finger  . HEEL SPUR SURGERY Right   . HERNIA REPAIR     age 81 inguinal   . KNEE ARTHROSCOPY Left   . LAPAROSCOPIC GASTRIC BANDING    . SHOULDER ARTHROSCOPY WITH DEBRIDEMENT AND BICEP TENDON REPAIR Right 08/12/2015   Procedure: SHOULDER ARTHROSCOPY WITH DEBRIDMENT, DECOMPRESSION, SLAP REPAIR, MINI OPEN BICEPS TENDONESIS;  Surgeon: Corky Mull, MD;  Location: [facility];  Service: Orthopedics;  Laterality: Right;  . TONSILLECTOMY      Social History Social History  Tobacco Use  . Smoking status: Never Smoker  . Smokeless tobacco: Never Used  Substance Use Topics  . Alcohol use: Yes    Comment: occ wine  . Drug use: No    Family History Family History  Problem Relation Age of Onset  . Breast cancer Neg Hx   . Bladder Cancer Neg Hx   . Kidney cancer Neg Hx     Allergies  Allergen Reactions  . Prednisone Other (See Comments)    hallucinantions  . Adhesive [Tape] Other (See Comments)    Skin discomfort with extended use  . Gluten Meal     Gluten intolerant  . Lactose Intolerance (Gi) Other  (See Comments)    Gi upset  . Nsaids Other (See Comments)    Patient has lap band     REVIEW OF SYSTEMS (Negative unless checked)  Constitutional: [] Weight loss  [] Fever  [] Chills Cardiac: [] Chest pain   [] Chest pressure   [] Palpitations   [] Shortness of breath when laying flat   [] Shortness of breath with exertion. Vascular:  [] Pain in legs with walking   [x] Pain in legs at rest  [] History of DVT   [] Phlebitis   [x] Swelling in legs   [x] Varicose veins   [] Non-healing ulcers Pulmonary:   [] Uses home oxygen   [] Productive cough   [] Hemoptysis   [] Wheeze  [] COPD   [] Asthma Neurologic:  [] Dizziness   [] Seizures   [] History of stroke   [] History of TIA  [] Aphasia   [] Vissual changes   [] Weakness or numbness in arm   [] Weakness or numbness in leg Musculoskeletal:   [] Joint swelling   [] Joint pain   [] Low back pain Hematologic:  [] Easy bruising  [] Easy bleeding   [] Hypercoagulable state   [] Anemic Gastrointestinal:  [] Diarrhea   [] Vomiting  [] Gastroesophageal reflux/heartburn   [] Difficulty swallowing. Genitourinary:  [] Chronic kidney disease   [] Difficult urination  [] Frequent urination   [] Blood in urine Skin:  [] Rashes   [] Ulcers  Psychological:  [] History of anxiety   []  History of major depression.  Physical Examination  Vitals:   02/27/19 1527  BP: 136/84  Pulse: 72  Resp: 16  Weight: 171 lb (77.6 kg)   Body mass index is 30.29 kg/m. Gen: WD/WN, NAD Head: [place]/AT, No temporalis wasting.  Ear/Nose/Throat: Hearing grossly intact, nares w/o erythema or drainage Eyes: PER, EOMI, sclera nonicteric.  Neck: Supple, no large masses.   Pulmonary:  Good air movement, no audible wheezing bilaterally, no use of accessory muscles.  Cardiac: RRR, no JVD Vascular: Large varicosities present extensively greater than 10 mm right leg.  Mild venous stasis changes to the legs bilaterally.  2+ soft pitting edema Vessel Right Left  Radial Palpable Palpable  PT Palpable Palpable  DP Palpable  Palpable  Gastrointestinal: Non-distended. No guarding/no peritoneal signs.  Musculoskeletal: M/S 5/5 throughout.  No deformity or atrophy.  Neurologic: CN 2-12 intact. Symmetrical.  Speech is fluent. Motor exam as listed above. Psychiatric: Judgment intact, Mood & affect appropriate for pt's clinical situation. Dermatologic: venous rashes no ulcers noted.  No changes consistent with cellulitis. Lymph : No lichenification or skin changes of chronic lymphedema.  CBC Lab Results  Component Value Date   WBC 10.4 12/28/2014   HGB 14.8 12/28/2014   HCT 43.9 12/28/2014   MCV 97 12/28/2014   PLT 225 12/28/2014    BMET    Component Value Date/Time   NA 136 08/09/2015 0951   NA 137 09/01/2013 1321   K 4.0 08/09/2015 0951   K  3.8 09/01/2013 1321   CL 103 08/09/2015 0951   CL 105 09/01/2013 1321   CO2 25 08/09/2015 0951   CO2 25 09/01/2013 1321   GLUCOSE 123 (H) 08/09/2015 0951   GLUCOSE 107 (H) 09/01/2013 1321   BUN 15 08/09/2015 0951   BUN 14 09/01/2013 1321   CREATININE 0.66 08/09/2015 0951   CREATININE 0.68 09/01/2013 1321   CALCIUM 9.1 08/09/2015 0951   CALCIUM 8.9 09/01/2013 1321   GFRNONAA >60 08/09/2015 0951   GFRNONAA >60 09/01/2013 1321   GFRAA >60 08/09/2015 0951   GFRAA >60 09/01/2013 1321   CrCl cannot be calculated (Patient's most recent lab result is older than the maximum 21 days allowed.).  COAG No results found for: INR, PROTIME  Radiology No results found.   Assessment/Plan 1. Varicose veins of both lower extremities with pain Recommend  I have reviewed my previous  discussion with the patient regarding  varicose veins and why they cause symptoms. Patient will continue  wearing graduated compression stockings class 1 on a daily basis, beginning first thing in the morning and removing them in the evening.    In addition, behavioral modification including elevation during the day was again discussed and this will continue.  The patient has utilized  over the counter pain medications and has been exercising.  However, at this time conservative therapy has not alleviated the patient's symptoms of leg pain and swelling  Recommend: laser ablation of the right great saphenous vein to eliminate the symptoms of pain and swelling of the lower extremities caused by the severe superficial venous reflux disease.   2. Bilateral lower extremity edema No surgery or intervention at this point in time.    I have had a long discussion with the patient regarding venous insufficiency and why it  causes symptoms. I have discussed with the patient the chronic skin changes that accompany venous insufficiency and the long term sequela such as infection and ulceration.  Patient will begin wearing graduated compression stockings class 1 (20-30 mmHg) or compression wraps on a daily basis a prescription was given. The patient will put the stockings on first thing in the morning and removing them in the evening. The patient is instructed specifically not to sleep in the stockings.    In addition, behavioral modification including several periods of elevation of the lower extremities during the day will be continued. I have demonstrated that proper elevation is a position with the ankles at heart level.  The patient is instructed to begin routine exercise, especially walking on a daily basis     Hortencia Pilar, MD  02/27/2019 3:37 PM

## 2019-03-05 ENCOUNTER — Encounter (INDEPENDENT_AMBULATORY_CARE_PROVIDER_SITE_OTHER): Payer: Self-pay | Admitting: Vascular Surgery

## 2019-06-10 ENCOUNTER — Encounter: Payer: Self-pay | Admitting: Urology

## 2019-06-10 ENCOUNTER — Ambulatory Visit: Payer: 59 | Admitting: Urology

## 2020-02-21 ENCOUNTER — Ambulatory Visit: Payer: 59 | Attending: Internal Medicine

## 2020-02-21 DIAGNOSIS — Z23 Encounter for immunization: Secondary | ICD-10-CM

## 2020-02-21 NOTE — Progress Notes (Signed)
   Covid-19 Vaccination Clinic  Name:  Lynn Dennis    MRN: [id number] DOB: 11/29/1961  02/21/2020  [name] was observed post Covid-19 immunization for 15 minutes without incident. She was provided with Vaccine Information Sheet and instruction to access the V-Safe system.   [name] was instructed to call 911 with any severe reactions post vaccine: Marland Kitchen Difficulty breathing  . Swelling of face and throat  . A fast heartbeat  . A bad rash all over body  . Dizziness and weakness   Immunizations Administered    Name Date Dose VIS Date Route   Pfizer COVID-19 Vaccine 02/21/2020  2:43 PM 0.3 mL 11/21/2019 Intramuscular   Manufacturer: [place]   Lot: [id number]   [place]: [id number]

## 2020-03-16 ENCOUNTER — Ambulatory Visit: Payer: 59 | Attending: Internal Medicine

## 2020-03-16 DIAGNOSIS — Z23 Encounter for immunization: Secondary | ICD-10-CM

## 2020-03-16 NOTE — Progress Notes (Signed)
   Covid-19 Vaccination Clinic  Name:  Lynn Dennis    MRN: [id number] DOB: Sep 26, 1961  03/16/2020  Lynn Dennis was observed post Covid-19 immunization for 15 minutes without incident. She was provided with Vaccine Information Sheet and instruction to access the V-Safe system.   Lynn Dennis was instructed to call 911 with any severe reactions post vaccine: Marland Kitchen Difficulty breathing  . Swelling of face and throat  . A fast heartbeat  . A bad rash all over body  . Dizziness and weakness   Immunizations Administered    Name Date Dose VIS Date Route   Pfizer COVID-19 Vaccine 03/16/2020  2:24 PM 0.3 mL 11/21/2019 Intramuscular   Manufacturer: [place]   Lot: [id number]   [place]: [id number]

## 2020-08-19 ENCOUNTER — Other Ambulatory Visit: Payer: Self-pay | Admitting: Internal Medicine

## 2020-08-19 DIAGNOSIS — Z1231 Encounter for screening mammogram for malignant neoplasm of breast: Secondary | ICD-10-CM

## 2020-09-02 ENCOUNTER — Ambulatory Visit
Admission: RE | Admit: 2020-09-02 | Discharge: 2020-09-02 | Disposition: A | Discharge status: Home / Self Care | Payer: 59 | Source: Ambulatory Visit | Attending: Internal Medicine | Admitting: Internal Medicine

## 2020-09-02 ENCOUNTER — Other Ambulatory Visit: Payer: Self-pay

## 2020-09-02 DIAGNOSIS — Z1231 Encounter for screening mammogram for malignant neoplasm of breast: Secondary | ICD-10-CM | POA: Diagnosis present

## 2020-12-31 ENCOUNTER — Encounter: Payer: Self-pay | Admitting: Plastic Surgery

## 2020-12-31 ENCOUNTER — Ambulatory Visit: Payer: 59 | Admitting: Plastic Surgery

## 2020-12-31 ENCOUNTER — Other Ambulatory Visit: Payer: Self-pay

## 2020-12-31 DIAGNOSIS — M546 Pain in thoracic spine: Secondary | ICD-10-CM

## 2020-12-31 DIAGNOSIS — G8929 Other chronic pain: Secondary | ICD-10-CM | POA: Diagnosis not present

## 2020-12-31 DIAGNOSIS — M793 Panniculitis, unspecified: Secondary | ICD-10-CM

## 2020-12-31 DIAGNOSIS — M549 Dorsalgia, unspecified: Secondary | ICD-10-CM | POA: Insufficient documentation

## 2020-12-31 NOTE — Progress Notes (Signed)
Patient ID: Lynn Dennis, female    DOB: 05/14/1961, 60 y.o.   MRN: [id number]   Chief Complaint  Patient presents with   Advice Only   Skin Problem    The patient is a 60 year old female here for evaluation of her abdomen.  The patient is 5 feet 3 inches tall and weighs 174 pounds.  She complains of back pain due to her pannus.  She also complains of rashes and skin breakdown in her folds.  She states that sometimes it gets so irritated that the skin cracks.  She does have some irritation that I noted today and was able to get a picture of it.  She has lost approximately 50 pounds in the past year to 2 years and has been able to keep this weight off.  Her past medical history is positive for anxiety, celiac disease, depression and interstitial cystitis.  She has had surgery before which includes a hernia repair, hysterectomy and a lap band.  The rest of her history as noted below.  She is not a smoker and does not have diabetes.  She is not on a blood thinner.  She has also received spinal injections for the pain with only temporary relief.  She had a mammogram in 2021 and it was negative.  She is a Agricultural engineer.  She has not had recent physical therapy but is willing to go for it.  I do not feel any hernia on today's exam.  It does not feel like she has a rectus diastases either.  She does have an impressive pannus.   Review of Systems  Constitutional: Positive for activity change. Negative for appetite change.  Eyes: Negative.   Respiratory: Negative for chest tightness and shortness of breath.   Cardiovascular: Negative for leg swelling.  Gastrointestinal: Positive for abdominal pain. Negative for abdominal distention.  Endocrine: Negative.   Genitourinary: Positive for pelvic pain and urgency.  Musculoskeletal: Positive for back pain.  Hematological: Negative.   Psychiatric/Behavioral: Negative.     Past Medical History:  Diagnosis Date   Arthritis    neck   Cancer ([place])     squamous cell skin cancer   GERD (gastroesophageal reflux disease)    only time is when lap band is loosened   History of hiatal hernia    fixed with lap band surgery   Pneumonia 2014   Sleep apnea    uses cpap    Past Surgical History:  Procedure Laterality Date   ABDOMINAL HYSTERECTOMY     CYSTO WITH HYDRODISTENSION N/A 03/20/2018   Procedure: CYSTOSCOPY/HYDRODISTENSION;  Surgeon: Hollice Espy, MD;  Location: [facility];  Service: Urology;  Laterality: N/A;   CYSTOSCOPY WITH BIOPSY N/A 03/20/2018   Procedure: CYSTOSCOPY WITH Bladder BIOPSY;  Surgeon: Hollice Espy, MD;  Location: [facility];  Service: Urology;  Laterality: N/A;   CYSTOSCOPY WITH FULGERATION N/A 03/20/2018   Procedure: CYSTOSCOPY WITH FULGERATION;  Surgeon: Hollice Espy, MD;  Location: [facility];  Service: Urology;  Laterality: N/A;   FRACTURE SURGERY     fractured finger   HEEL SPUR SURGERY Right    HERNIA REPAIR     age 64 inguinal    KNEE ARTHROSCOPY Left    LAPAROSCOPIC GASTRIC BANDING     SHOULDER ARTHROSCOPY WITH DEBRIDEMENT AND BICEP TENDON REPAIR Right 08/12/2015   Procedure: SHOULDER ARTHROSCOPY WITH DEBRIDMENT, DECOMPRESSION, SLAP REPAIR, MINI OPEN BICEPS TENDONESIS;  Surgeon: Corky Mull, MD;  Location: [facility];  Service: Orthopedics;  Laterality:  Right;   TONSILLECTOMY        Current Outpatient Medications:    acetaminophen (TYLENOL) 500 MG tablet, Take 1,000 mg by mouth every 6 (six) hours as needed for moderate pain or headache., Disp: , Rfl:    carboxymethylcellulose (REFRESH PLUS) 0.5 % SOLN, Place 1 drop into both eyes at bedtime., Disp: , Rfl:    escitalopram (LEXAPRO) 10 MG tablet, Take 1/2 tablet by mouth at bedtime., Disp: , Rfl:    estradiol (ESTRACE) 1 MG tablet, Take 1 mg by mouth daily., Disp: , Rfl:    hydrOXYzine (ATARAX/VISTARIL) 10 MG tablet, Take by mouth., Disp: , Rfl:    metroNIDAZOLE (METROGEL) 1 % gel, Apply topically., Disp: , Rfl:    Multiple Vitamin  (MULTI-VITAMINS) TABS, Take 1 tablet by mouth daily. , Disp: , Rfl:    Omega-3 Fatty Acids (FISH OIL PO), Take 1 tablet by mouth daily., Disp: , Rfl:    Phenazopyridine HCl 97.5 MG TABS, Take 1 tablet by mouth at bedtime as needed (for urinary pain relief). , Disp: , Rfl:    triamcinolone cream (KENALOG) 0.1 %, Apply 1 application topically daily as needed (rash). , Disp: , Rfl:    YUVAFEM 10 MCG TABS vaginal tablet, , Disp: , Rfl:    Objective:   Vitals:   12/31/20 0946  BP: 103/64  Pulse: 77  SpO2: 96%    Physical Exam Vitals and nursing note reviewed.  Constitutional:      Appearance: Normal appearance.  HENT:     Head: Normocephalic and atraumatic.  Cardiovascular:     Rate and Rhythm: Normal rate.     Pulses: Normal pulses.  Pulmonary:     Effort: Pulmonary effort is normal. No respiratory distress.  Abdominal:     General: Abdomen is flat. There is no distension.     Palpations: There is no mass.     Hernia: No hernia is present.  Neurological:     General: No focal deficit present.     Mental Status: She is alert and oriented to person, place, and time.  Psychiatric:        Mood and Affect: Mood normal.        Behavior: Behavior normal.     Assessment & Plan:  Panniculitis  Chronic bilateral thoracic back pain  The patient is a good candidate for a panniculectomy.  We will also send her for physical therapy for an evaluation and treatment.  We will plan on doing a telemetry visit in around 2 to 3 months to see how she is progressing with her therapy.  Pictures were obtained of the patient and placed in the chart with the patient's or guardian's permission.   [place], DO

## 2021-01-11 ENCOUNTER — Other Ambulatory Visit: Payer: Self-pay

## 2021-01-11 ENCOUNTER — Ambulatory Visit: Payer: 59 | Attending: Plastic Surgery

## 2021-01-11 DIAGNOSIS — G8929 Other chronic pain: Secondary | ICD-10-CM | POA: Insufficient documentation

## 2021-01-11 DIAGNOSIS — M6281 Muscle weakness (generalized): Secondary | ICD-10-CM | POA: Insufficient documentation

## 2021-01-11 DIAGNOSIS — M5441 Lumbago with sciatica, right side: Secondary | ICD-10-CM | POA: Insufficient documentation

## 2021-01-11 NOTE — Patient Instructions (Addendum)
Pt was recommended to perform transversus abdonmins contractions throughout the day. Pt demonstrated and verbalized understanding.

## 2021-01-11 NOTE — Therapy (Signed)
[facility] [address], [place], [address] Phone: [phone]   Fax:  [phone]  Physical Therapy Evaluation  Patient Details  Name: Lynn Dennis MRN: [id number] Date of Birth: 12-29-1960 Referring Provider (PT): Wallace Going, DO   Encounter Date: 01/11/2021   PT End of Session - 01/11/21 1518    Visit Number 1    Number of Visits 17    Date for PT Re-Evaluation 03/10/21    PT Start Time 1519    PT Stop Time 1602    PT Time Calculation (min) 43 min    Activity Tolerance Patient tolerated treatment well    Behavior During Therapy [facility] for tasks assessed/performed           Past Medical History:  Diagnosis Date  . Arthritis    neck  . Cancer ([facility])    squamous cell skin cancer  . GERD (gastroesophageal reflux disease)    only time is when lap band is loosened  . History of hiatal hernia    fixed with lap band surgery  . Pneumonia 2014  . Sleep apnea    uses cpap    Past Surgical History:  Procedure Laterality Date  . ABDOMINAL HYSTERECTOMY    . CYSTO WITH HYDRODISTENSION N/A 03/20/2018   Procedure: CYSTOSCOPY/HYDRODISTENSION;  Surgeon: Hollice Espy, MD;  Location: [facility];  Service: Urology;  Laterality: N/A;  . CYSTOSCOPY WITH BIOPSY N/A 03/20/2018   Procedure: CYSTOSCOPY WITH Bladder BIOPSY;  Surgeon: Hollice Espy, MD;  Location: [facility];  Service: Urology;  Laterality: N/A;  . CYSTOSCOPY WITH FULGERATION N/A 03/20/2018   Procedure: CYSTOSCOPY WITH FULGERATION;  Surgeon: Hollice Espy, MD;  Location: [facility];  Service: Urology;  Laterality: N/A;  . FRACTURE SURGERY     fractured finger  . HEEL SPUR SURGERY Right   . HERNIA REPAIR     age 18 inguinal   . KNEE ARTHROSCOPY Left   . LAPAROSCOPIC GASTRIC BANDING    . SHOULDER ARTHROSCOPY WITH DEBRIDEMENT AND BICEP TENDON REPAIR Right 08/12/2015   Procedure: SHOULDER ARTHROSCOPY WITH DEBRIDMENT, DECOMPRESSION, SLAP REPAIR,  MINI OPEN BICEPS TENDONESIS;  Surgeon: Corky Mull, MD;  Location: [facility];  Service: Orthopedics;  Laterality: Right;  . TONSILLECTOMY      There were no vitals filed for this visit.    Subjective Assessment - 01/11/21 1522    Subjective Low back (R > L): 5/10 currently (pt standing, 3/10 in sitting), 12/10 back pain at most for the past 3 months. R LE: 0/10 currently (pt sitting), 12/10 at worst for the past 3 months.    Pertinent History Low back pain. Gradual onset. Got shots R low back for sciatic pain past couple of years. Has chronically weak abdominal muscles. Had PT for it in the past which helped. Thinks her hernia repair from when she was a kid started. Hoping to get a paniculectomy in the near future. Has DDD in low back. Has interstitial cistitis which increases urniation. No loss of bowel control.    Patient Stated Goals Improve core strength. Improve posture.    Currently in Pain? Yes    Pain Score 5     Pain Location Back    Pain Orientation Right;Left;Lower;Posterior    Pain Type Chronic pain    Pain Onset More than a month ago    Pain Frequency Occasional    Aggravating Factors  sitting for at least 30 minutes, standing, 10-15 minutes, lifting,  R S/L, supine, prone    Pain Relieving Factors sitting short time, escatalopram, propping her feet up. L S/L              [facility] PT Assessment - 01/11/21 1535      Assessment   Medical Diagnosis Low back pain    Referring Provider (PT) Dillingham, Loel Lofty, DO    Onset Date/Surgical Date 12/31/20    Hand Dominance Right      Precautions   Precaution Comments No known precautions      Balance Screen   Has the patient fallen in the past 6 months No    Has the patient had a decrease in activity level because of a fear of falling?  No    Is the patient reluctant to leave their home because of a fear of falling?  No      Home Environment   Additional Comments Pt lives in a 2 story home with husband. 14 steep steps to go  to 2nd floor, L rail. No steps to enter home.      Cognition   Overall Cognitive Status Within Functional Limits for tasks assessed      Posture/Postural Control   Posture Comments forward neck, R shoulder lower, slight R trunk rotation, L thoracolumbar convexity, L lumbar side bend, R foot slightly forward.      AROM   Lumbar Flexion WFL    Lumbar Extension limited with slight R lumbar rotation.    Lumbar - Right Side Bend WFL    Lumbar - Left Side Bend limited    Lumbar - Right Rotation WFL   with L low back tightness   Lumbar - Left Rotation WFL      Strength   Right Hip Flexion 4-/5    Right Hip Extension 4-/5   seated manually resisted   Right Hip ABduction 4-/5    Left Hip Flexion 4-/5    Left Hip Extension 3+/5   seated manually resisted   Left Hip ABduction 4-/5    Right Knee Flexion 4+/5    Right Knee Extension 5/5    Left Knee Flexion 4+/5    Left Knee Extension 5/5      Palpation   Palpation comment TTP L greater trochanter      Special Tests   Other special tests (-) repeated flexion test. Long sit test suggest anterior nutation R innominate.      Ambulation/Gait   Gait Comments antalgic, decreased stance R LE, B pelvic drop/trendelenberg                      Objective measurements completed on examination: See above findings.     Possible latex allergy hx of CA No blood pressure problems per pt.    Gait: antalgic, decreased stance R LE, B pelvic drop/trendelenberg  Observation: supine: low back pain (increased lumbar extension posture)  Pt was recommended to perform transversus abdonmins contractions throughout the day. Pt demonstrated and verbalized understanding.     Response to treatment Pt tolerated session well without aggravation of symptoms   Clinical impression Patient is a 60 year old female who came to physical therapy secondary to low back pain. She also demonstrates R LE symptoms, altered gait pattern and posture,  bilateral hip weakness, special test suggesting lumbopelvic involvement, TTP L greater trochanter, and difficulty performing standing tasks as well as tolerating prolonged sitting positions secondary to low back and R LE pain. Pt will benefit from skilled  physical therapy services to address the aforementioned deficits.          PT Education - 01/11/21 1915    Education provided Yes    Education Details ther-ex, plan of care    Person(s) Educated Patient    Methods Explanation    Comprehension Verbalized understanding;Returned demonstration            PT Short Term Goals - 01/11/21 1925      PT SHORT TERM GOAL #1   Title Pt will be independent with her initial HEP to decrease pain, improve ability to perform standing tasks as well as tolerate sitting more comfortably.    Time 3    Period Weeks    Status New    Target Date 02/03/21             PT Long Term Goals - 01/11/21 1927      PT LONG TERM GOAL #1   Title Patient will have a decrease in low back pain to 4/10 or less at worst to promote abilty to perform standing tasks as well as tolerate sitting more comfortably.    Baseline 12/10 low back pain at most for the past 3 months (01/11/2021)    Time 8    Period Weeks    Status New    Target Date 03/10/21      PT LONG TERM GOAL #2   Title Pt will have a decrease in R LE pain to 4/10 or less at worst to promote abilty to perform standing tasks as well as tolerate sitting more comfortably.    Baseline 12/10 R LE pain at most for the past 3 months (01/11/2021)    Time 8    Period Weeks    Status New    Target Date 03/10/21      PT LONG TERM GOAL #3   Title Patient will improve B hip extension and abduction strength by at least 1/2 MMT grade to promote ability to perform standing tasks more comfortably.    Time 8    Period Weeks    Status New    Target Date 03/10/21      PT LONG TERM GOAL #4   Title Patient will improve her lumbar FOTO score by at least 10 points as a  demonstration of improved function.    Baseline Lumbar FOTO 42 (01/11/2021)    Time 8    Period Weeks    Status New    Target Date 03/10/21                  Plan - 01/11/21 1917    Clinical Impression Statement Patient is a 60 year old female who came to physical therapy secondary to low back pain. She also demonstrates R LE symptoms, altered gait pattern and posture, bilateral hip weakness, special test suggesting lumbopelvic involvement, TTP L greater trochanter, and difficulty performing standing tasks as well as tolerating prolonged sitting positions secondary to low back and R LE pain. Pt will benefit from skilled physical therapy services to address the aforementioned deficits.    Personal Factors and Comorbidities Comorbidity 3+;Past/Current Experience;Fitness;Time since onset of injury/illness/exacerbation    Comorbidities Arthritis, CA, sleep apnea, abdominal hysterectomy    Examination-Activity Limitations Carry;Stand;Bed Mobility    Stability/Clinical Decision Making Stable/Uncomplicated    Clinical Decision Making Low    Rehab Potential Fair    PT Frequency 2x / week    PT Duration 8 weeks    PT Treatment/Interventions Therapeutic exercise;Therapeutic  activities;Aquatic Therapy;Electrical Stimulation;Iontophoresis 4mg /ml Dexamethasone;Traction;Neuromuscular re-education;Patient/family education;Manual techniques;Dry needling    PT Next Visit Plan posture, thoracic extension, trunk and hip strengthening, manual techniques, modalities PRN    Consulted and Agree with Plan of Care Patient           Patient will benefit from skilled therapeutic intervention in order to improve the following deficits and impairments:  Pain,Postural dysfunction,Improper body mechanics,Difficulty walking,Decreased strength  Visit Diagnosis: Chronic bilateral low back pain with right-sided sciatica - Plan: PT plan of care cert/re-cert  Muscle weakness (generalized) - Plan: PT plan of care  cert/re-cert     Problem List Patient Active Problem List   Diagnosis Date Noted  . Panniculitis 12/31/2020  . Back pain 12/31/2020  . Varicose veins of both lower extremities with pain 11/06/2018  . Bilateral lower extremity edema 11/06/2018    Joneen Boers PT, DPT   01/11/2021, 7:41 PM  [facility] [address], [place], [address] Phone: [phone]   Fax:  [phone]  Name: Lynn Dennis MRN: [id number] Date of Birth: May 29, 1961

## 2021-01-26 ENCOUNTER — Other Ambulatory Visit: Payer: Self-pay

## 2021-01-26 ENCOUNTER — Ambulatory Visit: Payer: 59

## 2021-01-26 DIAGNOSIS — M6281 Muscle weakness (generalized): Secondary | ICD-10-CM

## 2021-01-26 DIAGNOSIS — G8929 Other chronic pain: Secondary | ICD-10-CM

## 2021-01-26 DIAGNOSIS — M5441 Lumbago with sciatica, right side: Secondary | ICD-10-CM | POA: Diagnosis not present

## 2021-01-26 NOTE — Therapy (Signed)
[facility] [address] [place], [address] Phone: [phone]   Fax:  [phone]  Physical Therapy Treatment  Patient Details  Name: Lynn Dennis MRN: [id number] Date of Birth: 04/11/1961 Referring Provider (PT): Wallace Going, DO   Encounter Date: 01/26/2021   PT End of Session - 01/26/21 1606    Visit Number 2    Number of Visits 17    Date for PT Re-Evaluation 03/10/21    PT Start Time 1606    PT Stop Time 1646    PT Time Calculation (min) 40 min    Activity Tolerance Patient tolerated treatment well    Behavior During Therapy [facility] for tasks assessed/performed           Past Medical History:  Diagnosis Date  . Arthritis    neck  . Cancer ([facility])    squamous cell skin cancer  . GERD (gastroesophageal reflux disease)    only time is when lap band is loosened  . History of hiatal hernia    fixed with lap band surgery  . Pneumonia 2014  . Sleep apnea    uses cpap    Past Surgical History:  Procedure Laterality Date  . ABDOMINAL HYSTERECTOMY    . CYSTO WITH HYDRODISTENSION N/A 03/20/2018   Procedure: CYSTOSCOPY/HYDRODISTENSION;  Surgeon: Hollice Espy, MD;  Location: [facility];  Service: Urology;  Laterality: N/A;  . CYSTOSCOPY WITH BIOPSY N/A 03/20/2018   Procedure: CYSTOSCOPY WITH Bladder BIOPSY;  Surgeon: Hollice Espy, MD;  Location: [facility];  Service: Urology;  Laterality: N/A;  . CYSTOSCOPY WITH FULGERATION N/A 03/20/2018   Procedure: CYSTOSCOPY WITH FULGERATION;  Surgeon: Hollice Espy, MD;  Location: [facility];  Service: Urology;  Laterality: N/A;  . FRACTURE SURGERY     fractured finger  . HEEL SPUR SURGERY Right   . HERNIA REPAIR     age 8 inguinal   . KNEE ARTHROSCOPY Left   . LAPAROSCOPIC GASTRIC BANDING    . SHOULDER ARTHROSCOPY WITH DEBRIDEMENT AND BICEP TENDON REPAIR Right 08/12/2015   Procedure: SHOULDER ARTHROSCOPY WITH DEBRIDMENT, DECOMPRESSION, SLAP REPAIR,  MINI OPEN BICEPS TENDONESIS;  Surgeon: Corky Mull, MD;  Location: [facility];  Service: Orthopedics;  Laterality: Right;  . TONSILLECTOMY      There were no vitals filed for this visit.   Subjective Assessment - 01/26/21 1607    Subjective Pt does a lot of shopping Monday's and is on her feet a lot. R thigh is sore. 3/10 low back currently in sitting.    Pertinent History Low back pain. Gradual onset. Got shots R low back for sciatic pain past couple of years. Has chronically weak abdominal muscles. Had PT for it in the past which helped. Thinks her hernia repair from when she was a kid started. Hoping to get a paniculectomy in the near future. Has DDD in low back. Has interstitial cistitis which increases urniation. No loss of bowel control.    Patient Stated Goals Improve core strength. Improve posture.    Currently in Pain? Yes    Pain Score 3     Pain Onset More than a month ago                                     PT Education - 01/26/21 1628    Education provided Yes    Education Details ther-ex, HEP  Person(s) Educated Patient    Methods Explanation;Demonstration;Tactile cues;Verbal cues;Handout    Comprehension Returned demonstration;Verbalized understanding           Objective   Possible latex allergy hx of CA No blood pressure problems per pt.    Gait: antalgic, decreased stance R LE, B pelvic drop/trendelenberg  Observation: supine: low back pain (increased lumbar extension posture)  Pt was recommended to perform transversus abdonmins contractions throughout the day. Pt demonstrated and verbalized understanding.   [employer] Access Code [id number]  Therapeutic exercise  seated manually resisted R lateral shift isometrics 10x3 with 5 second holds   Seated manually resisted L upper trunk rotation 10x2 with 5 seconds  Seated manually resisted R upper trunk rotation 10x5 seconds for 2 sets   Improved low back posture.   No pressure  pain in low back afterwards  Seated B scapular retraction to promote thoracic extension 10x5 seconds for 3 sets  Seated transversus abdominis contraction 10x2 with 10 second holds   Seated B shoulder extension isoemetrics, hands on thighs 10x5 seconds for 3 sets   Seated glute max squeeze 10x5 seconds for 2 sets  Improved exercise technique, movement at target joints, use of target muscles after mod verbal, visual, tactile cues.    Response to treatment No back pain after session reported by pt.    Clinical impression Decreased low back pain with treatment to decrease L lateral shift as well as R lumbar rotation posture. Worked on core strengthening to help maintain posture. Worked on thoracic extension to help decrease stress to low back. No back pain reported at end of session. Pt will benefit from continued skilled physical therapy services to decrease pain, improve strength and function.      PT Short Term Goals - 01/11/21 1925      PT SHORT TERM GOAL #1   Title Pt will be independent with her initial HEP to decrease pain, improve ability to perform standing tasks as well as tolerate sitting more comfortably.    Time 3    Period Weeks    Status New    Target Date 02/03/21             PT Long Term Goals - 01/11/21 1927      PT LONG TERM GOAL #1   Title Patient will have a decrease in low back pain to 4/10 or less at worst to promote abilty to perform standing tasks as well as tolerate sitting more comfortably.    Baseline 12/10 low back pain at most for the past 3 months (01/11/2021)    Time 8    Period Weeks    Status New    Target Date 03/10/21      PT LONG TERM GOAL #2   Title Pt will have a decrease in R LE pain to 4/10 or less at worst to promote abilty to perform standing tasks as well as tolerate sitting more comfortably.    Baseline 12/10 R LE pain at most for the past 3 months (01/11/2021)    Time 8    Period Weeks    Status New    Target Date 03/10/21       PT LONG TERM GOAL #3   Title Patient will improve B hip extension and abduction strength by at least 1/2 MMT grade to promote ability to perform standing tasks more comfortably.    Time 8    Period Weeks    Status New    Target Date 03/10/21  PT LONG TERM GOAL #4   Title Patient will improve her lumbar FOTO score by at least 10 points as a demonstration of improved function.    Baseline Lumbar FOTO 42 (01/11/2021)    Time 8    Period Weeks    Status New    Target Date 03/10/21                 Plan - 01/26/21 1629    Clinical Impression Statement Decreased low back pain with treatment to decrease L lateral shift as well as R lumbar rotation posture. Worked on core strengthening to help maintain posture. Worked on thoracic extension to help decrease stress to low back. No back pain reported at end of session. Pt will benefit from continued skilled physical therapy services to decrease pain, improve strength and function.    Personal Factors and Comorbidities Comorbidity 3+;Past/Current Experience;Fitness;Time since onset of injury/illness/exacerbation    Comorbidities Arthritis, CA, sleep apnea, abdominal hysterectomy    Examination-Activity Limitations Carry;Stand;Bed Mobility    Stability/Clinical Decision Making Stable/Uncomplicated    Clinical Decision Making Low    Rehab Potential Fair    PT Frequency 2x / week    PT Duration 8 weeks    PT Treatment/Interventions Therapeutic exercise;Therapeutic activities;Aquatic Therapy;Electrical Stimulation;Iontophoresis 4mg /ml Dexamethasone;Traction;Neuromuscular re-education;Patient/family education;Manual techniques;Dry needling    PT Next Visit Plan posture, thoracic extension, trunk and hip strengthening, manual techniques, modalities PRN    Consulted and Agree with Plan of Care Patient           Patient will benefit from skilled therapeutic intervention in order to improve the following deficits and impairments:   Pain,Postural dysfunction,Improper body mechanics,Difficulty walking,Decreased strength  Visit Diagnosis: Chronic bilateral low back pain with right-sided sciatica  Muscle weakness (generalized)     Problem List Patient Active Problem List   Diagnosis Date Noted  . Panniculitis 12/31/2020  . Back pain 12/31/2020  . Varicose veins of both lower extremities with pain 11/06/2018  . Bilateral lower extremity edema 11/06/2018    Joneen Boers PT, DPT   01/26/2021, 4:56 PM  [facility] [address] [place], [address] Phone: [phone]   Fax:  [phone]  Name: Lynn Dennis MRN: [id number] Date of Birth: 08/18/1961

## 2021-01-26 NOTE — Patient Instructions (Addendum)
  Date: 01/26/2021 Prepared by: Joneen Boers  Exercises Seated Scapular Retraction - 1 x daily - 7 x weekly - 3 sets - 10 reps - 5 seconds hold Seated Transversus Abdominis Bracing - 5 x daily - 7 x weekly - 3 sets - 10 reps - 10 seconds hold Seated Gluteal Sets - 5 x daily - 7 x weekly - 3 sets - 10 reps - 5 seconds hold

## 2021-02-01 ENCOUNTER — Other Ambulatory Visit: Payer: Self-pay

## 2021-02-01 ENCOUNTER — Ambulatory Visit: Payer: 59

## 2021-02-01 DIAGNOSIS — M5441 Lumbago with sciatica, right side: Secondary | ICD-10-CM | POA: Diagnosis not present

## 2021-02-01 DIAGNOSIS — M6281 Muscle weakness (generalized): Secondary | ICD-10-CM

## 2021-02-01 DIAGNOSIS — G8929 Other chronic pain: Secondary | ICD-10-CM

## 2021-02-01 NOTE — Therapy (Signed)
[facility] [address] [place], [address] Phone: [phone]   Fax:  [phone]  Physical Therapy Treatment  Patient Details  Name: Lynn Dennis MRN: [id number] Date of Birth: 03-16-1961 Referring Provider (PT): Wallace Going, DO   Encounter Date: 02/01/2021   PT End of Session - 02/01/21 1347    Visit Number 3    Number of Visits 17    Date for PT Re-Evaluation 03/10/21    PT Start Time 1448    PT Stop Time 1428    PT Time Calculation (min) 41 min    Activity Tolerance Patient tolerated treatment well    Behavior During Therapy [facility] for tasks assessed/performed           Past Medical History:  Diagnosis Date  . Arthritis    neck  . Cancer (HCC)    squamous cell skin cancer  . GERD (gastroesophageal reflux disease)    only time is when lap band is loosened  . History of hiatal hernia    fixed with lap band surgery  . Pneumonia 2014  . Sleep apnea    uses cpap    Past Surgical History:  Procedure Laterality Date  . ABDOMINAL HYSTERECTOMY    . CYSTO WITH HYDRODISTENSION N/A 03/20/2018   Procedure: CYSTOSCOPY/HYDRODISTENSION;  Surgeon: Hollice Espy, MD;  Location: [facility];  Service: Urology;  Laterality: N/A;  . CYSTOSCOPY WITH BIOPSY N/A 03/20/2018   Procedure: CYSTOSCOPY WITH Bladder BIOPSY;  Surgeon: Hollice Espy, MD;  Location: [facility];  Service: Urology;  Laterality: N/A;  . CYSTOSCOPY WITH FULGERATION N/A 03/20/2018   Procedure: CYSTOSCOPY WITH FULGERATION;  Surgeon: Hollice Espy, MD;  Location: [facility];  Service: Urology;  Laterality: N/A;  . FRACTURE SURGERY     fractured finger  . HEEL SPUR SURGERY Right   . HERNIA REPAIR     age 20 inguinal   . KNEE ARTHROSCOPY Left   . LAPAROSCOPIC GASTRIC BANDING    . SHOULDER ARTHROSCOPY WITH DEBRIDEMENT AND BICEP TENDON REPAIR Right 08/12/2015   Procedure: SHOULDER ARTHROSCOPY WITH DEBRIDMENT, DECOMPRESSION, SLAP REPAIR,  MINI OPEN BICEPS TENDONESIS;  Surgeon: Corky Mull, MD;  Location: [facility];  Service: Orthopedics;  Laterality: Right;  . TONSILLECTOMY      There were no vitals filed for this visit.   Subjective Assessment - 02/01/21 1349    Subjective Lower back has been bugging her but has been busy for the past couple of days. 7/10 currently from lifting a heavy grinding wheel.    Pertinent History Low back pain. Gradual onset. Got shots R low back for sciatic pain past couple of years. Has chronically weak abdominal muscles. Had PT for it in the past which helped. Thinks her hernia repair from when she was a kid started. Hoping to get a paniculectomy in the near future. Has DDD in low back. Has interstitial cistitis which increases urniation. No loss of bowel control.    Patient Stated Goals Improve core strength. Improve posture.    Currently in Pain? Yes    Pain Score 7     Pain Onset More than a month ago                                     PT Education - 02/01/21 1359    Education provided Yes    Education Details ther-ex, HEP  Person(s) Educated Patient    Methods Explanation;Demonstration;Tactile cues;Verbal cues;Handout    Comprehension Returned demonstration;Verbalized understanding            Objective  Possible latex allergy hx of CA No blood pressure problems per pt.   Gait: antalgic, decreased stance R LE, B pelvic drop/trendelenberg  Observation: supine: low back pain (increased lumbar extension posture)  Pt was recommended to perform transversus abdonmins contractions throughout the day.Pt demonstrated and verbalized understanding.  [employer] Access Code [id number]  Therapeutic exercise  seated manually resisted R lateral shift isometrics 10x3 with 5 second holds   seated hip extension isometrics   L 10x5 seconds for 3 sets(decreased L lateral shift posture)   Seated manually resisted R upper trunk rotation 10x5 seconds for 2  sets              Improved low back posture.              No pressure pain in low back afterwards  Seated transversus abdominis contraction 10x2 with 10 second holds   No low back pain after aforementioned exercises reported by pt.   Hooklying posterior pelvic tilt 10x5 seconds for 2 sets  Bridge 10x3  Supine hip fallouts 5x2 each LE   Difficulty with pelvic control.    Seated B scapular retraction to promote thoracic extension 10x5 seconds for 2 sets   Improved exercise technique, movement at target joints, use of target muscles after mod verbal, visual, tactile cues.    Response to treatment No back pain after session reported by pt.    Clinical impression Continued working on decreasing lateral shift and trunk rotation posture as well as increasing trunk and glute strength to help decrease stress to low back. Pt tolerated session well without aggravation of symptoms. Pt will benefit from continued skilled physical therapy services to decrease pain, improve strength and function.        PT Short Term Goals - 01/11/21 1925      PT SHORT TERM GOAL #1   Title Pt will be independent with her initial HEP to decrease pain, improve ability to perform standing tasks as well as tolerate sitting more comfortably.    Time 3    Period Weeks    Status New    Target Date 02/03/21             PT Long Term Goals - 01/11/21 1927      PT LONG TERM GOAL #1   Title Patient will have a decrease in low back pain to 4/10 or less at worst to promote abilty to perform standing tasks as well as tolerate sitting more comfortably.    Baseline 12/10 low back pain at most for the past 3 months (01/11/2021)    Time 8    Period Weeks    Status New    Target Date 03/10/21      PT LONG TERM GOAL #2   Title Pt will have a decrease in R LE pain to 4/10 or less at worst to promote abilty to perform standing tasks as well as tolerate sitting more comfortably.    Baseline 12/10 R LE pain at  most for the past 3 months (01/11/2021)    Time 8    Period Weeks    Status New    Target Date 03/10/21      PT LONG TERM GOAL #3   Title Patient will improve B hip extension and abduction strength by at least 1/2 MMT grade  to promote ability to perform standing tasks more comfortably.    Time 8    Period Weeks    Status New    Target Date 03/10/21      PT LONG TERM GOAL #4   Title Patient will improve her lumbar FOTO score by at least 10 points as a demonstration of improved function.    Baseline Lumbar FOTO 42 (01/11/2021)    Time 8    Period Weeks    Status New    Target Date 03/10/21                 Plan - 02/01/21 1359    Clinical Impression Statement Continued working on decreasing lateral shift and trunk rotation posture as well as increasing trunk and glute strength to help decrease stress to low back. Pt tolerated session well without aggravation of symptoms. Pt will benefit from continued skilled physical therapy services to decrease pain, improve strength and function.    Personal Factors and Comorbidities Comorbidity 3+;Past/Current Experience;Fitness;Time since onset of injury/illness/exacerbation    Comorbidities Arthritis, CA, sleep apnea, abdominal hysterectomy    Examination-Activity Limitations Carry;Stand;Bed Mobility    Stability/Clinical Decision Making Stable/Uncomplicated    Rehab Potential Fair    PT Frequency 2x / week    PT Duration 8 weeks    PT Treatment/Interventions Therapeutic exercise;Therapeutic activities;Aquatic Therapy;Electrical Stimulation;Iontophoresis 4mg /ml Dexamethasone;Traction;Neuromuscular re-education;Patient/family education;Manual techniques;Dry needling    PT Next Visit Plan posture, thoracic extension, trunk and hip strengthening, manual techniques, modalities PRN    Consulted and Agree with Plan of Care Patient           Patient will benefit from skilled therapeutic intervention in order to improve the following deficits  and impairments:  Pain,Postural dysfunction,Improper body mechanics,Difficulty walking,Decreased strength  Visit Diagnosis: Chronic bilateral low back pain with right-sided sciatica  Muscle weakness (generalized)     Problem List Patient Active Problem List   Diagnosis Date Noted  . Panniculitis 12/31/2020  . Back pain 12/31/2020  . Varicose veins of both lower extremities with pain 11/06/2018  . Bilateral lower extremity edema 11/06/2018    Joneen Boers PT, DPT   02/01/2021, 6:20 PM  [facility] [address] [place], [address] Phone: [phone]   Fax:  [phone]  Name: Lynn Dennis MRN: [id number] Date of Birth: 1961/04/21

## 2021-02-01 NOTE — Patient Instructions (Signed)
Seated hip extension isometrics ? ? ?Sitting on a chair,  ? ? Squeeze your rear end muscles together and press your L foot onto the floor.  ? ? ? Hold for 5 seconds  ? ? Repeat 10 times ? ? Perform 3 sets daily.  ? ? ? ? This is a corrective exercise. Once you no longer have symptoms, you can stop.  ? ?

## 2021-02-03 ENCOUNTER — Ambulatory Visit: Payer: 59

## 2021-02-03 ENCOUNTER — Other Ambulatory Visit: Payer: Self-pay

## 2021-02-03 DIAGNOSIS — M6281 Muscle weakness (generalized): Secondary | ICD-10-CM

## 2021-02-03 DIAGNOSIS — G8929 Other chronic pain: Secondary | ICD-10-CM

## 2021-02-03 DIAGNOSIS — M5441 Lumbago with sciatica, right side: Secondary | ICD-10-CM | POA: Diagnosis not present

## 2021-02-03 NOTE — Therapy (Signed)
[place] [facility] [address] [place], [address] Phone: [phone]   Fax:  [phone]  Physical Therapy Treatment  Patient Details  Name: Lynn Dennis MRN: [id number] Date of Birth: 04-03-1961 Referring Provider (PT): Wallace Going, DO   Encounter Date: 02/03/2021   PT End of Session - 02/03/21 1645    Visit Number 4    Number of Visits 17    Date for PT Re-Evaluation 03/10/21    PT Start Time 6468    PT Stop Time 1726    PT Time Calculation (min) 41 min    Activity Tolerance Patient tolerated treatment well    Behavior During Therapy [facility] for tasks assessed/performed           Past Medical History:  Diagnosis Date  . Arthritis    neck  . Cancer (HCC)    squamous cell skin cancer  . GERD (gastroesophageal reflux disease)    only time is when lap band is loosened  . History of hiatal hernia    fixed with lap band surgery  . Pneumonia 2014  . Sleep apnea    uses cpap    Past Surgical History:  Procedure Laterality Date  . ABDOMINAL HYSTERECTOMY    . CYSTO WITH HYDRODISTENSION N/A 03/20/2018   Procedure: CYSTOSCOPY/HYDRODISTENSION;  Surgeon: Hollice Espy, MD;  Location: [facility];  Service: Urology;  Laterality: N/A;  . CYSTOSCOPY WITH BIOPSY N/A 03/20/2018   Procedure: CYSTOSCOPY WITH Bladder BIOPSY;  Surgeon: Hollice Espy, MD;  Location: [facility];  Service: Urology;  Laterality: N/A;  . CYSTOSCOPY WITH FULGERATION N/A 03/20/2018   Procedure: CYSTOSCOPY WITH FULGERATION;  Surgeon: Hollice Espy, MD;  Location: [facility];  Service: Urology;  Laterality: N/A;  . FRACTURE SURGERY     fractured finger  . HEEL SPUR SURGERY Right   . HERNIA REPAIR     age 76 inguinal   . KNEE ARTHROSCOPY Left   . LAPAROSCOPIC GASTRIC BANDING    . SHOULDER ARTHROSCOPY WITH DEBRIDEMENT AND BICEP TENDON REPAIR Right 08/12/2015   Procedure: SHOULDER ARTHROSCOPY WITH DEBRIDMENT, DECOMPRESSION, SLAP REPAIR,  MINI OPEN BICEPS TENDONESIS;  Surgeon: Corky Mull, MD;  Location: [facility];  Service: Orthopedics;  Laterality: Right;  . TONSILLECTOMY      There were no vitals filed for this visit.   Subjective Assessment - 02/03/21 1646    Subjective Back is ok. No pain currently.    Pertinent History Low back pain. Gradual onset. Got shots R low back for sciatic pain past couple of years. Has chronically weak abdominal muscles. Had PT for it in the past which helped. Thinks her hernia repair from when she was a kid started. Hoping to get a paniculectomy in the near future. Has DDD in low back. Has interstitial cistitis which increases urniation. No loss of bowel control.    Patient Stated Goals Improve core strength. Improve posture.    Currently in Pain? No/denies    Pain Score 0-No pain    Pain Onset More than a month ago                                     PT Education - 02/03/21 1649    Education provided Yes    Education Details ther-ex    [employer]) Educated Patient    Methods Explanation;Demonstration;Tactile cues;Verbal cues    Comprehension Returned demonstration;Verbalized  understanding          Objective  Possible latex allergy hx of CA No blood pressure problems per pt.   Gait: antalgic, decreased stance R LE, B pelvic drop/trendelenberg  Observation: supine: low back pain (increased lumbar extension posture)  Pt was recommended to perform transversus abdonmins contractions throughout the day.Pt demonstrated and verbalized understanding.  MedbridgeAccess Code [email or web address]  Therapeutic exercise  S/L hip abduction  R 10x2  L 10x2  Hooklying posterior pelvic tilt 10x5 seconds for 2 sets   Supine hip fallouts 5x2 each LE              Difficulty with pelvic control. L pelvic rotation with L hip fallout   Bridge 10x  seated manually resisted R lateral shift isometrics 10x3 with 5 second holds   seated hip extension isometrics               L 10x5 seconds for 3 sets(decreased L lateral shift posture)    Seated manually resisted R upper trunk rotation 10x5 seconds for 2 sets    Standing B scapular retraction red band to promote thoracic extension 10x5 seconds for 2 sets      No low back pain after aforementioned exercises reported by pt.    Improved exercise technique, movement at target joints, use of target muscles after mod verbal, visual, tactile cues.   Response to treatment Pt tolerated session well without aggravation of symptoms.    Clinical impression Pt returns to PT without complain of back pain. Continued working on improving core and glute strength as well as posture to help decrease stress to low back. Pt tolerated session well without aggravation of symptoms. Pt will benefit from continued skilled physical therapy services to decrease pain, improve strength and function.       PT Short Term Goals - 01/11/21 1925      PT SHORT TERM GOAL #1   Title Pt will be independent with her initial HEP to decrease pain, improve ability to perform standing tasks as well as tolerate sitting more comfortably.    Time 3    Period Weeks    Status New    Target Date 02/03/21             PT Long Term Goals - 01/11/21 1927      PT LONG TERM GOAL #1   Title Patient will have a decrease in low back pain to 4/10 or less at worst to promote abilty to perform standing tasks as well as tolerate sitting more comfortably.    Baseline 12/10 low back pain at most for the past 3 months (01/11/2021)    Time 8    Period Weeks    Status New    Target Date 03/10/21      PT LONG TERM GOAL #2   Title Pt will have a decrease in R LE pain to 4/10 or less at worst to promote abilty to perform standing tasks as well as tolerate sitting more comfortably.    Baseline 12/10 R LE pain at most for the past 3 months (01/11/2021)    Time 8    Period Weeks    Status New    Target Date 03/10/21      PT  LONG TERM GOAL #3   Title Patient will improve B hip extension and abduction strength by at least 1/2 MMT grade to promote ability to perform standing tasks more comfortably.    Time 8    Period  Weeks    Status New    Target Date 03/10/21      PT LONG TERM GOAL #4   Title Patient will improve her lumbar FOTO score by at least 10 points as a demonstration of improved function.    Baseline Lumbar FOTO 42 (01/11/2021)    Time 8    Period Weeks    Status New    Target Date 03/10/21                 Plan - 02/03/21 1651    Clinical Impression Statement Pt returns to PT without complain of back pain. Continued working on improving core and glute strength as well as posture to help decrease stress to low back. Pt tolerated session well without aggravation of symptoms. Pt will benefit from continued skilled physical therapy services to decrease pain, improve strength and function.    Personal Factors and Comorbidities Comorbidity 3+;Past/Current Experience;Fitness;Time since onset of injury/illness/exacerbation    Comorbidities Arthritis, CA, sleep apnea, abdominal hysterectomy    Examination-Activity Limitations Carry;Stand;Bed Mobility    Stability/Clinical Decision Making Stable/Uncomplicated    Clinical Decision Making Low    Rehab Potential Fair    PT Frequency 2x / week    PT Duration 8 weeks    PT Treatment/Interventions Therapeutic exercise;Therapeutic activities;Aquatic Therapy;Electrical Stimulation;Iontophoresis 4mg /ml Dexamethasone;Traction;Neuromuscular re-education;Patient/family education;Manual techniques;Dry needling    PT Next Visit Plan posture, thoracic extension, trunk and hip strengthening, manual techniques, modalities PRN    Consulted and Agree with Plan of Care Patient           Patient will benefit from skilled therapeutic intervention in order to improve the following deficits and impairments:  Pain,Postural dysfunction,Improper body mechanics,Difficulty  walking,Decreased strength  Visit Diagnosis: Chronic bilateral low back pain with right-sided sciatica  Muscle weakness (generalized)     Problem List Patient Active Problem List   Diagnosis Date Noted  . Panniculitis 12/31/2020  . Back pain 12/31/2020  . Varicose veins of both lower extremities with pain 11/06/2018  . Bilateral lower extremity edema 11/06/2018    Joneen Boers PT, DPT   02/03/2021, 5:36 PM  [place] [facility] [address], [place], [address] Phone: [phone]   Fax:  [phone]  Name: CARMISHA LARUSSO MRN: [id number] Date of Birth: [birth year]

## 2021-02-08 ENCOUNTER — Other Ambulatory Visit: Payer: Self-pay

## 2021-02-08 ENCOUNTER — Ambulatory Visit: Payer: 59 | Attending: Plastic Surgery

## 2021-02-08 DIAGNOSIS — M5441 Lumbago with sciatica, right side: Secondary | ICD-10-CM | POA: Diagnosis not present

## 2021-02-08 DIAGNOSIS — M6281 Muscle weakness (generalized): Secondary | ICD-10-CM | POA: Diagnosis present

## 2021-02-08 DIAGNOSIS — G8929 Other chronic pain: Secondary | ICD-10-CM | POA: Diagnosis present

## 2021-02-08 NOTE — Therapy (Signed)
[facility] [address], [place], [address] Phone: [phone]   Fax:  [phone]  Physical Therapy Treatment  Patient Details  Name: Lynn Dennis MRN: [id number] Date of Birth: Jun 13, 1961 Referring Provider (PT): Wallace Going, DO   Encounter Date: 02/08/2021   PT End of Session - 02/08/21 1603    Visit Number 5    Number of Visits 17    Date for PT Re-Evaluation 03/10/21    Authorization Type 5    Authorization Time Period of 10 progressr eport    PT Start Time 1604    PT Stop Time 1648    PT Time Calculation (min) 44 min    Activity Tolerance Patient tolerated treatment well    Behavior During Therapy [facility] for tasks assessed/performed           Past Medical History:  Diagnosis Date  . Arthritis    neck  . Cancer (HCC)    squamous cell skin cancer  . GERD (gastroesophageal reflux disease)    only time is when lap band is loosened  . History of hiatal hernia    fixed with lap band surgery  . Pneumonia 2014  . Sleep apnea    uses cpap    Past Surgical History:  Procedure Laterality Date  . ABDOMINAL HYSTERECTOMY    . CYSTO WITH HYDRODISTENSION N/A 03/20/2018   Procedure: CYSTOSCOPY/HYDRODISTENSION;  Surgeon: Hollice Espy, MD;  Location: [facility];  Service: Urology;  Laterality: N/A;  . CYSTOSCOPY WITH BIOPSY N/A 03/20/2018   Procedure: CYSTOSCOPY WITH Bladder BIOPSY;  Surgeon: Hollice Espy, MD;  Location: [facility];  Service: Urology;  Laterality: N/A;  . CYSTOSCOPY WITH FULGERATION N/A 03/20/2018   Procedure: CYSTOSCOPY WITH FULGERATION;  Surgeon: Hollice Espy, MD;  Location: [facility];  Service: Urology;  Laterality: N/A;  . FRACTURE SURGERY     fractured finger  . HEEL SPUR SURGERY Right   . HERNIA REPAIR     age 3 inguinal   . KNEE ARTHROSCOPY Left   . LAPAROSCOPIC GASTRIC BANDING    . SHOULDER ARTHROSCOPY WITH DEBRIDEMENT AND BICEP TENDON REPAIR Right 08/12/2015    Procedure: SHOULDER ARTHROSCOPY WITH DEBRIDMENT, DECOMPRESSION, SLAP REPAIR, MINI OPEN BICEPS TENDONESIS;  Surgeon: Corky Mull, MD;  Location: [facility];  Service: Orthopedics;  Laterality: Right;  . TONSILLECTOMY      There were no vitals filed for this visit.   Subjective Assessment - 02/08/21 1605    Subjective The chair she sits on at home to watch TV with husband positions her L hip lower. 3/10 low back pain currently, not bad.    Pertinent History Low back pain. Gradual onset. Got shots R low back for sciatic pain past couple of years. Has chronically weak abdominal muscles. Had PT for it in the past which helped. Thinks her hernia repair from when she was a kid started. Hoping to get a paniculectomy in the near future. Has DDD in low back. Has interstitial cistitis which increases urniation. No loss of bowel control.    Patient Stated Goals Improve core strength. Improve posture.    Currently in Pain? Yes    Pain Score 3     Pain Onset More than a month ago                                     PT Education -  02/08/21 1616    Education provided Yes    Education Details ther-ex    [employer]) Educated Patient    Methods Explanation;Demonstration;Tactile cues    Comprehension Returned demonstration;Verbalized understanding          Objective  Possible latex allergy hx of CA No blood pressure problems per pt.   Gait: antalgic, decreased stance R LE, B pelvic drop/trendelenberg  Observation: supine: low back pain (increased lumbar extension posture)  Pt was recommended to perform transversus abdonmins contractions throughout the day.Pt demonstrated and verbalized understanding.  MedbridgeAccess Code [email or web address]  Therapeutic exercise   seated manually resisted R lateral shift isometrics 10x3 with 5 second holds  Seated manually resisted R upper trunk rotation 10x5 seconds for 2 sets   seated hip extension isometrics  L 10x5  seconds for 3 sets  Hooklying posterior pelvic tilt 10x10 seconds for 2 sets  Supine hip fallouts 10x each LE   Bridge 7x  S/L hip abduction             R 10x2             L 10x2   Seated B shoulder extension isometrics, hands on thighs. 10x5 seconds for 3 sets.     Improved exercise technique, movement at target joints, use of target muscles after mod verbal, visual, tactile cues.   Response to treatment Pt tolerated session well without aggravation of symptoms. No back pain after session.    Clinical impression Continued working on decreasing lateral shift, and promoting neutral rotation to low back as well as increasing glute and trunk muscle activation to decrease stress to low back. Pt tolerated session well without aggravation of symptoms. Pt will benefit from continued skilled physical therapy services to decrease pain, improve strength and function.        PT Short Term Goals - 01/11/21 1925      PT SHORT TERM GOAL #1   Title Pt will be independent with her initial HEP to decrease pain, improve ability to perform standing tasks as well as tolerate sitting more comfortably.    Time 3    Period Weeks    Status New    Target Date 02/03/21             PT Long Term Goals - 01/11/21 1927      PT LONG TERM GOAL #1   Title Patient will have a decrease in low back pain to 4/10 or less at worst to promote abilty to perform standing tasks as well as tolerate sitting more comfortably.    Baseline 12/10 low back pain at most for the past 3 months (01/11/2021)    Time 8    Period Weeks    Status New    Target Date 03/10/21      PT LONG TERM GOAL #2   Title Pt will have a decrease in R LE pain to 4/10 or less at worst to promote abilty to perform standing tasks as well as tolerate sitting more comfortably.    Baseline 12/10 R LE pain at most for the past 3 months (01/11/2021)    Time 8    Period Weeks    Status New    Target Date 03/10/21       PT LONG TERM GOAL #3   Title Patient will improve B hip extension and abduction strength by at least 1/2 MMT grade to promote ability to perform standing tasks more comfortably.    Time 8  Period Weeks    Status New    Target Date 03/10/21      PT LONG TERM GOAL #4   Title Patient will improve her lumbar FOTO score by at least 10 points as a demonstration of improved function.    Baseline Lumbar FOTO 42 (01/11/2021)    Time 8    Period Weeks    Status New    Target Date 03/10/21                 Plan - 02/08/21 1616    Clinical Impression Statement Continued working on decreasing lateral shift, and promoting neutral rotation to low back as well as increasing glute and trunk muscle activation to decrease stress to low back. Pt tolerated session well without aggravation of symptoms. Pt will benefit from continued skilled physical therapy services to decrease pain, improve strength and function.    Personal Factors and Comorbidities Comorbidity 3+;Past/Current Experience;Fitness;Time since onset of injury/illness/exacerbation    Comorbidities Arthritis, CA, sleep apnea, abdominal hysterectomy    Examination-Activity Limitations Carry;Stand;Bed Mobility    Stability/Clinical Decision Making Stable/Uncomplicated    Clinical Decision Making Low    Rehab Potential Fair    PT Frequency 2x / week    PT Duration 8 weeks    PT Treatment/Interventions Therapeutic exercise;Therapeutic activities;Aquatic Therapy;Electrical Stimulation;Iontophoresis 4mg /ml Dexamethasone;Traction;Neuromuscular re-education;Patient/family education;Manual techniques;Dry needling    PT Next Visit Plan posture, thoracic extension, trunk and hip strengthening, manual techniques, modalities PRN    Consulted and Agree with Plan of Care Patient           Patient will benefit from skilled therapeutic intervention in order to improve the following deficits and impairments:  Pain,Postural dysfunction,Improper  body mechanics,Difficulty walking,Decreased strength  Visit Diagnosis: Chronic bilateral low back pain with right-sided sciatica  Muscle weakness (generalized)     Problem List Patient Active Problem List   Diagnosis Date Noted  . Panniculitis 12/31/2020  . Back pain 12/31/2020  . Varicose veins of both lower extremities with pain 11/06/2018  . Bilateral lower extremity edema 11/06/2018    Joneen Boers PT, DPT   02/08/2021, 5:58 PM  [facility] [address], [place], [address] Phone: [phone]   Fax:  [phone]  Name: JAMILYA SARRAZIN MRN: [id number] Date of Birth: 12-17-1960

## 2021-02-10 ENCOUNTER — Ambulatory Visit: Payer: 59

## 2021-02-10 ENCOUNTER — Other Ambulatory Visit: Payer: Self-pay

## 2021-02-10 DIAGNOSIS — M6281 Muscle weakness (generalized): Secondary | ICD-10-CM

## 2021-02-10 DIAGNOSIS — G8929 Other chronic pain: Secondary | ICD-10-CM

## 2021-02-10 DIAGNOSIS — M5441 Lumbago with sciatica, right side: Secondary | ICD-10-CM | POA: Diagnosis not present

## 2021-02-10 NOTE — Therapy (Signed)
[facility] [address], [place], [address] Phone: [phone]   Fax:  [phone]  Physical Therapy Treatment  Patient Details  Name: Lynn Dennis MRN: [id number] Date of Birth: 15-Oct-1961 Referring Provider (PT): Marla Roe Loel Lofty, DO   Encounter Date: 02/10/2021   PT End of Session - 02/10/21 1647    Visit Number 6    Number of Visits 17    Date for PT Re-Evaluation 03/10/21    Authorization Type 6    Authorization Time Period of 10 progress report    PT Start Time 1646    PT Stop Time 1726    PT Time Calculation (min) 40 min    Activity Tolerance Patient tolerated treatment well    Behavior During Therapy [facility] for tasks assessed/performed           Past Medical History:  Diagnosis Date  . Arthritis    neck  . Cancer (HCC)    squamous cell skin cancer  . GERD (gastroesophageal reflux disease)    only time is when lap band is loosened  . History of hiatal hernia    fixed with lap band surgery  . Pneumonia 2014  . Sleep apnea    uses cpap    Past Surgical History:  Procedure Laterality Date  . ABDOMINAL HYSTERECTOMY    . CYSTO WITH HYDRODISTENSION N/A 03/20/2018   Procedure: CYSTOSCOPY/HYDRODISTENSION;  Surgeon: Hollice Espy, MD;  Location: [facility];  Service: Urology;  Laterality: N/A;  . CYSTOSCOPY WITH BIOPSY N/A 03/20/2018   Procedure: CYSTOSCOPY WITH Bladder BIOPSY;  Surgeon: Hollice Espy, MD;  Location: [facility];  Service: Urology;  Laterality: N/A;  . CYSTOSCOPY WITH FULGERATION N/A 03/20/2018   Procedure: CYSTOSCOPY WITH FULGERATION;  Surgeon: Hollice Espy, MD;  Location: [facility];  Service: Urology;  Laterality: N/A;  . FRACTURE SURGERY     fractured finger  . HEEL SPUR SURGERY Right   . HERNIA REPAIR     age 60 inguinal   . KNEE ARTHROSCOPY Left   . LAPAROSCOPIC GASTRIC BANDING    . SHOULDER ARTHROSCOPY WITH DEBRIDEMENT AND BICEP TENDON REPAIR Right 08/12/2015    Procedure: SHOULDER ARTHROSCOPY WITH DEBRIDMENT, DECOMPRESSION, SLAP REPAIR, MINI OPEN BICEPS TENDONESIS;  Surgeon: Corky Mull, MD;  Location: [facility];  Service: Orthopedics;  Laterality: Right;  . TONSILLECTOMY      There were no vitals filed for this visit.   Subjective Assessment - 02/10/21 1648    Subjective Low back is ok sitting. Cleaned her house earlier trying to find the keys to her car. The chair that she sits on bothers her back.    Pertinent History Low back pain. Gradual onset. Got shots R low back for sciatic pain past couple of years. Has chronically weak abdominal muscles. Had PT for it in the past which helped. Thinks her hernia repair from when she was a kid started. Hoping to get a paniculectomy in the near future. Has DDD in low back. Has interstitial cistitis which increases urniation. No loss of bowel control.    Patient Stated Goals Improve core strength. Improve posture.    Currently in Pain? No/denies    Pain Onset More than a month ago                                     PT Education - 02/10/21 1702  Education provided Yes    Education Details ther-ex    Person(s) Educated Patient    Methods Explanation;Demonstration;Tactile cues;Verbal cues    Comprehension Returned demonstration;Verbalized understanding             Objective  Possible latex allergy hx of CA No blood pressure problems per pt.   Gait: antalgic, decreased stance R LE, B pelvic drop/trendelenberg  Observation: supine: low back pain (increased lumbar extension posture)  Pt was recommended to perform transversus abdonmins contractions throughout the day.Pt demonstrated and verbalized understanding.  MedbridgeAccess Code [email or web address]  Therapeutic exercise  standing R shoulder adduction green band 10x5 seconds  Decreased L lateral shift posture  Standing B low rows with scapular retraction green band 10x5 seconds   seated hip extension isometrics   L 10x5 seconds for 3 sets  Seated B shoulder extension isometrics, hands on thighs. 10x5 seconds for 3 sets.   seated manually resisted R lateral shift isometrics 10x3 with 5 second holds  Seated manually resisted R upper trunk rotation 10x5 seconds for 3 sets   seated manually resisted trunk flexion isometrics in neutral, PT manual resistance 10x3 with 5 second holds      Improved exercise technique, movement at target joints, use of target muscles after mod verbal, visual, tactile cues.   Response to treatment Pt tolerated session well without aggravation of symptoms.Just low back tightness.    Clinical impression Improving number of times with 0/10 starting low back pain. Continued working on improving posture, as well as trunk strength to help decrease stress to low back. Pt tolerated session well without aggravation of symptoms. Pt will benefit from continued skilled physical therapy services to decrease pain, improve strength and function.     PT Short Term Goals - 01/11/21 1925      PT SHORT TERM GOAL #1   Title Pt will be independent with her initial HEP to decrease pain, improve ability to perform standing tasks as well as tolerate sitting more comfortably.    Time 3    Period Weeks    Status New    Target Date 02/03/21             PT Long Term Goals - 01/11/21 1927      PT LONG TERM GOAL #1   Title Patient will have a decrease in low back pain to 4/10 or less at worst to promote abilty to perform standing tasks as well as tolerate sitting more comfortably.    Baseline 12/10 low back pain at most for the past 3 months (01/11/2021)    Time 8    Period Weeks    Status New    Target Date 03/10/21      PT LONG TERM GOAL #2   Title Pt will have a decrease in R LE pain to 4/10 or less at worst to promote abilty to perform standing tasks as well as tolerate sitting more comfortably.    Baseline 12/10 R LE pain at most for the past 3 months  (01/11/2021)    Time 8    Period Weeks    Status New    Target Date 03/10/21      PT LONG TERM GOAL #3   Title Patient will improve B hip extension and abduction strength by at least 1/2 MMT grade to promote ability to perform standing tasks more comfortably.    Time 8    Period Weeks    Status New    Target Date 03/10/21  PT LONG TERM GOAL #4   Title Patient will improve her lumbar FOTO score by at least 10 points as a demonstration of improved function.    Baseline Lumbar FOTO 42 (01/11/2021)    Time 8    Period Weeks    Status New    Target Date 03/10/21                 Plan - 02/10/21 1647    Clinical Impression Statement Improving number of times with 0/10 starting low back pain. Continued working on improving posture, as well as trunk strength to help decrease stress to low back. Pt tolerated session well without aggravation of symptoms. Pt will benefit from continued skilled physical therapy services to decrease pain, improve strength and function.    Personal Factors and Comorbidities Comorbidity 3+;Past/Current Experience;Fitness;Time since onset of injury/illness/exacerbation    Comorbidities Arthritis, CA, sleep apnea, abdominal hysterectomy    Examination-Activity Limitations Carry;Stand;Bed Mobility    Stability/Clinical Decision Making Stable/Uncomplicated    Clinical Decision Making Low    Rehab Potential Fair    PT Frequency 2x / week    PT Duration 8 weeks    PT Treatment/Interventions Therapeutic exercise;Therapeutic activities;Aquatic Therapy;Electrical Stimulation;Iontophoresis 4mg /ml Dexamethasone;Traction;Neuromuscular re-education;Patient/family education;Manual techniques;Dry needling    PT Next Visit Plan posture, thoracic extension, trunk and hip strengthening, manual techniques, modalities PRN    Consulted and Agree with Plan of Care Patient           Patient will benefit from skilled therapeutic intervention in order to improve the following  deficits and impairments:  Pain,Postural dysfunction,Improper body mechanics,Difficulty walking,Decreased strength  Visit Diagnosis: Chronic bilateral low back pain with right-sided sciatica  Muscle weakness (generalized)     Problem List Patient Active Problem List   Diagnosis Date Noted  . Panniculitis 12/31/2020  . Back pain 12/31/2020  . Varicose veins of both lower extremities with pain 11/06/2018  . Bilateral lower extremity edema 11/06/2018    Joneen Boers PT, DPT   02/10/2021, 5:40 PM  [facility] [address], [place], [address] Phone: [phone]   Fax:  [phone]  Name: Lynn Dennis MRN: [id number] Date of Birth: [birth year]

## 2021-02-15 ENCOUNTER — Other Ambulatory Visit: Payer: Self-pay

## 2021-02-15 ENCOUNTER — Ambulatory Visit: Payer: 59

## 2021-02-15 DIAGNOSIS — G8929 Other chronic pain: Secondary | ICD-10-CM

## 2021-02-15 DIAGNOSIS — M5441 Lumbago with sciatica, right side: Secondary | ICD-10-CM

## 2021-02-15 DIAGNOSIS — M6281 Muscle weakness (generalized): Secondary | ICD-10-CM

## 2021-02-15 NOTE — Therapy (Signed)
[facility] [address], [place], [address] Phone: [phone]   Fax:  [phone]  Physical Therapy Treatment  Patient Details  Name: Lynn Dennis MRN: [id number] Date of Birth: 08-Jul-1961 Referring Provider (PT): Wallace Going, DO   Encounter Date: 02/15/2021   PT End of Session - 02/15/21 1152    Visit Number 7    Number of Visits 17    Date for PT Re-Evaluation 03/10/21    PT Start Time 2703    PT Stop Time 1226    PT Time Calculation (min) 38 min    Equipment Utilized During Treatment Gait belt    Activity Tolerance Patient tolerated treatment well;No increased pain    Behavior During Therapy WFL for tasks assessed/performed           Past Medical History:  Diagnosis Date  . Arthritis    neck  . Cancer (HCC)    squamous cell skin cancer  . GERD (gastroesophageal reflux disease)    only time is when lap band is loosened  . History of hiatal hernia    fixed with lap band surgery  . Pneumonia 2014  . Sleep apnea    uses cpap    Past Surgical History:  Procedure Laterality Date  . ABDOMINAL HYSTERECTOMY    . CYSTO WITH HYDRODISTENSION N/A 03/20/2018   Procedure: CYSTOSCOPY/HYDRODISTENSION;  Surgeon: Hollice Espy, MD;  Location: [facility];  Service: Urology;  Laterality: N/A;  . CYSTOSCOPY WITH BIOPSY N/A 03/20/2018   Procedure: CYSTOSCOPY WITH Bladder BIOPSY;  Surgeon: Hollice Espy, MD;  Location: [facility];  Service: Urology;  Laterality: N/A;  . CYSTOSCOPY WITH FULGERATION N/A 03/20/2018   Procedure: CYSTOSCOPY WITH FULGERATION;  Surgeon: Hollice Espy, MD;  Location: [facility];  Service: Urology;  Laterality: N/A;  . FRACTURE SURGERY     fractured finger  . HEEL SPUR SURGERY Right   . HERNIA REPAIR     age 89 inguinal   . KNEE ARTHROSCOPY Left   . LAPAROSCOPIC GASTRIC BANDING    . SHOULDER ARTHROSCOPY WITH DEBRIDEMENT AND BICEP TENDON REPAIR Right 08/12/2015   Procedure:  SHOULDER ARTHROSCOPY WITH DEBRIDMENT, DECOMPRESSION, SLAP REPAIR, MINI OPEN BICEPS TENDONESIS;  Surgeon: Corky Mull, MD;  Location: [facility];  Service: Orthopedics;  Laterality: Right;  . TONSILLECTOMY      There were no vitals filed for this visit.   Subjective Assessment - 02/15/21 1150    Subjective Pt doing well today, reports some continued pain near Left PSIS ~4/10 today.    Pertinent History Low back pain. Gradual onset. Got shots R low back for sciatic pain past couple of years. Has chronically weak abdominal muscles. Had PT for it in the past which helped. Thinks her hernia repair from when she was a kid started. Hoping to get a paniculectomy in the near future. Has DDD in low back. Has interstitial cistitis which increases urniation. No loss of bowel control.          INTERVENTION THIS DATE:  Performed in standing  -standing Rt shoulder adduction green band 2x10x5secH -standing B low rows with scapular retraction green band 2x10x5secH  Performed sitting in chair  -seated isometric Left hip extension (hip at 90*, foot on 2 pillows) 2x10x5secH -Bilat shoulder extension isometrics, hands on thighs 2x10x5secH (cued slight scap retraction/depression) -standing trunk rotation (long arm lever) yellow TB (like a baseball bunting) 2x15   Performed in hooklying  -BLE bent knee raise 2x10x5secH (  cues for chin tuck to avoid cervical exacerbation)  -BUE flexion c weight 2x10 c 3lb PVC  -Slow-motion highknee marching 2x43ft       PT Short Term Goals - 01/11/21 1925      PT SHORT TERM GOAL #1   Title Pt will be independent with her initial HEP to decrease pain, improve ability to perform standing tasks as well as tolerate sitting more comfortably.    Time 3    Period Weeks    Status New    Target Date 02/03/21             PT Long Term Goals - 01/11/21 1927      PT LONG TERM GOAL #1   Title Patient will have a decrease in low back pain to 4/10 or less at worst to promote  abilty to perform standing tasks as well as tolerate sitting more comfortably.    Baseline 12/10 low back pain at most for the past 3 months (01/11/2021)    Time 8    Period Weeks    Status New    Target Date 03/10/21      PT LONG TERM GOAL #2   Title Pt will have a decrease in R LE pain to 4/10 or less at worst to promote abilty to perform standing tasks as well as tolerate sitting more comfortably.    Baseline 12/10 R LE pain at most for the past 3 months (01/11/2021)    Time 8    Period Weeks    Status New    Target Date 03/10/21      PT LONG TERM GOAL #3   Title Patient will improve B hip extension and abduction strength by at least 1/2 MMT grade to promote ability to perform standing tasks more comfortably.    Time 8    Period Weeks    Status New    Target Date 03/10/21      PT LONG TERM GOAL #4   Title Patient will improve her lumbar FOTO score by at least 10 points as a demonstration of improved function.    Baseline Lumbar FOTO 42 (01/11/2021)    Time 8    Period Weeks    Status New    Target Date 03/10/21                 Plan - 02/15/21 1154    Clinical Impression Statement Continued with current plan of care as laid out in evaluation and recent prior sessions. Added in some hook-lying core stabilization activity, modified range to minimize symptoms exacerbation, mostly 'deadbug' precursors, all tolerated quite well. Pt remains motivated to advance progress toward goals. Rest breaks provided as needed, pt quick to ask when needed. Author maintains all interventions within appropriate level of intensity as not to purposefully exacerbate pain. Pt does require varying levels of assistance and cuing for completion of exercises for correct form and sometimes due to pain/weakness. Pt continues to demonstrate progress toward goals AEB progression of some interventions this date either in volume or intensity. No updates to HEP this date.    Personal Factors and Comorbidities  Comorbidity 3+;Past/Current Experience;Fitness;Time since onset of injury/illness/exacerbation    Comorbidities Arthritis, CA, sleep apnea, abdominal hysterectomy    Examination-Activity Limitations Carry;Stand;Bed Mobility    Stability/Clinical Decision Making Stable/Uncomplicated    Clinical Decision Making Low    Rehab Potential Fair    PT Frequency 2x / week    PT Duration 8 weeks  PT Treatment/Interventions Therapeutic exercise;Therapeutic activities;Aquatic Therapy;Electrical Stimulation;Iontophoresis 4mg /ml Dexamethasone;Traction;Neuromuscular re-education;Patient/family education;Manual techniques;Dry needling    PT Next Visit Plan posture, thoracic extension, trunk and hip strengthening, manual techniques, modalities PRN    PT Home Exercise Plan scapular retraction, cervical spine retraction, posture awareness    Consulted and Agree with Plan of Care Patient           Patient will benefit from skilled therapeutic intervention in order to improve the following deficits and impairments:  Pain,Postural dysfunction,Improper body mechanics,Difficulty walking,Decreased strength  Visit Diagnosis: Chronic bilateral low back pain with right-sided sciatica  Muscle weakness (generalized)     Problem List Patient Active Problem List   Diagnosis Date Noted  . Panniculitis 12/31/2020  . Back pain 12/31/2020  . Varicose veins of both lower extremities with pain 11/06/2018  . Bilateral lower extremity edema 11/06/2018   12:19 PM, 02/15/21 Etta Grandchild, PT, DPT Physical Therapist - [place] [phone] (Office)   Milianna Ericsson C 02/15/2021, 11:56 AM  [facility] [address], [place], [address] Phone: [phone]   Fax:  [phone]  Name: JACQUE BYRON MRN: [id number] Date of Birth: 09-Oct-1961

## 2021-02-22 ENCOUNTER — Ambulatory Visit: Payer: 59 | Admitting: Urology

## 2021-02-22 ENCOUNTER — Encounter: Payer: Self-pay | Admitting: Urology

## 2021-02-22 ENCOUNTER — Ambulatory Visit: Payer: 59

## 2021-02-22 ENCOUNTER — Other Ambulatory Visit: Payer: Self-pay

## 2021-02-22 VITALS — BP 128/74 | HR 63 | Ht 63.0 in | Wt 174.0 lb

## 2021-02-22 DIAGNOSIS — M5441 Lumbago with sciatica, right side: Secondary | ICD-10-CM | POA: Diagnosis not present

## 2021-02-22 DIAGNOSIS — N3010 Interstitial cystitis (chronic) without hematuria: Secondary | ICD-10-CM

## 2021-02-22 DIAGNOSIS — M6281 Muscle weakness (generalized): Secondary | ICD-10-CM

## 2021-02-22 DIAGNOSIS — G8929 Other chronic pain: Secondary | ICD-10-CM

## 2021-02-22 LAB — URINALYSIS, COMPLETE
Bilirubin, UA: NEGATIVE
Glucose, UA: NEGATIVE
Ketones, UA: NEGATIVE
Leukocytes,UA: NEGATIVE
Nitrite, UA: NEGATIVE
Protein,UA: NEGATIVE
RBC, UA: NEGATIVE
Specific Gravity, UA: 1.015 (ref 1.005–1.030)
Urobilinogen, Ur: 0.2 mg/dL (ref 0.2–1.0)
pH, UA: 7.5 (ref 5.0–7.5)

## 2021-02-22 LAB — MICROSCOPIC EXAMINATION: Bacteria, UA: NONE SEEN

## 2021-02-22 LAB — BLADDER SCAN AMB NON-IMAGING: Scan Result: 15

## 2021-02-22 NOTE — Progress Notes (Signed)
02/22/2021 7:54 PM   Lynn Dennis 1961/10/05 [id number]  Referring provider: Rusty Aus, MD [facility] [place],  [place] [address]  Chief Complaint  Patient presents with  . Interstitial cystitis    HPI: 60 year old female last seen in 2019 returns today for follow-up of interstitial cystitis.  She underwent cystoscopy, hydrodistention with biopsies in 03/2018.  Following this, she was actually able to get her symptoms under control with dietary change, tracking her food on an app and taking oxybutynin as needed.  She continues to do fairly well.  Her symptoms are controlled unless she has dietary indiscretions, primarily triggered by acidic foods.  She does have hydroxyzine but does not use it because it sedating.  She is almost never has to use oxybutynin but also has this prescription.  She will occasionally use Pyridium for burning.  Overall, she is fairly asymptomatic today.  Urinalysis today is negative.  She does report that over the past couple years, she is really focused on dietary change including avoiding gluten secondary to an allergy which has actually resulted in a fair amount of weight loss.  She believes this is also helped her bladder symptoms.  She does have a large suprapubic pannus of loose skin and fat which presses on her bladder and causes worsening of her symptoms especially when compressed.  She is considering panniculectomy.   PMH: Past Medical History:  Diagnosis Date  . Arthritis    neck  . Cancer (HCC)    squamous cell skin cancer  . GERD (gastroesophageal reflux disease)    only time is when lap band is loosened  . History of hiatal hernia    fixed with lap band surgery  . Pneumonia 2014  . Sleep apnea    uses cpap    Surgical History: Past Surgical History:  Procedure Laterality Date  . ABDOMINAL HYSTERECTOMY    . CYSTO WITH HYDRODISTENSION N/A 03/20/2018   Procedure:  CYSTOSCOPY/HYDRODISTENSION;  Surgeon: Hollice Espy, MD;  Location: [facility];  Service: Urology;  Laterality: N/A;  . CYSTOSCOPY WITH BIOPSY N/A 03/20/2018   Procedure: CYSTOSCOPY WITH Bladder BIOPSY;  Surgeon: Hollice Espy, MD;  Location: [facility];  Service: Urology;  Laterality: N/A;  . CYSTOSCOPY WITH FULGERATION N/A 03/20/2018   Procedure: CYSTOSCOPY WITH FULGERATION;  Surgeon: Hollice Espy, MD;  Location: [facility];  Service: Urology;  Laterality: N/A;  . FRACTURE SURGERY     fractured finger  . HEEL SPUR SURGERY Right   . HERNIA REPAIR     age 68 inguinal   . KNEE ARTHROSCOPY Left   . LAPAROSCOPIC GASTRIC BANDING    . SHOULDER ARTHROSCOPY WITH DEBRIDEMENT AND BICEP TENDON REPAIR Right 08/12/2015   Procedure: SHOULDER ARTHROSCOPY WITH DEBRIDMENT, DECOMPRESSION, SLAP REPAIR, MINI OPEN BICEPS TENDONESIS;  Surgeon: Corky Mull, MD;  Location: [facility];  Service: Orthopedics;  Laterality: Right;  . TONSILLECTOMY      Home Medications:  Allergies as of 02/22/2021      Reactions   Prednisone Other (See Comments)   hallucinantions   Adhesive [tape] Other (See Comments)   Skin discomfort with extended use   Gluten Meal    Gluten intolerant   Lactose Intolerance (gi) Other (See Comments)   Gi upset   Nsaids Other (See Comments)   Patient has lap band      Medication List       Accurate as of February 22, 2021  7:54 PM. If you have any  questions, ask your nurse or doctor.        acetaminophen 500 MG tablet Commonly known as: TYLENOL Take 1,000 mg by mouth every 6 (six) hours as needed for moderate pain or headache.   carboxymethylcellulose 0.5 % Soln Commonly known as: REFRESH PLUS Place 1 drop into both eyes at bedtime.   escitalopram 10 MG tablet Commonly known as: LEXAPRO Take 1/2 tablet by mouth at bedtime.   estradiol 1 MG tablet Commonly known as: ESTRACE Take 1 mg by mouth daily.   FISH OIL PO Take 1 tablet by mouth daily.   hydrOXYzine 10 MG  tablet Commonly known as: ATARAX/VISTARIL Take by mouth.   metroNIDAZOLE 1 % gel Commonly known as: METROGEL Apply topically.   Multi-Vitamins Tabs Take 1 tablet by mouth daily.   Phenazopyridine HCl 97.5 MG Tabs Take 1 tablet by mouth at bedtime as needed (for urinary pain relief).   triamcinolone 0.1 % Commonly known as: KENALOG Apply 1 application topically daily as needed (rash).   Yuvafem 10 MCG Tabs vaginal tablet Generic drug: Estradiol       Allergies:  Allergies  Allergen Reactions  . Prednisone Other (See Comments)    hallucinantions  . Adhesive [Tape] Other (See Comments)    Skin discomfort with extended use  . Gluten Meal     Gluten intolerant  . Lactose Intolerance (Gi) Other (See Comments)    Gi upset  . Nsaids Other (See Comments)    Patient has lap band    Family History: Family History  Problem Relation Age of Onset  . Breast cancer Neg Hx   . Bladder Cancer Neg Hx   . Kidney cancer Neg Hx     Social History:  reports that she has never smoked. She has never used smokeless tobacco. She reports current alcohol use. She reports that she does not use drugs.   Physical Exam: BP 128/74   Pulse 63   Ht 5\' 3"  (1.6 m)   Wt 174 lb (78.9 kg)   BMI 30.82 kg/m   Constitutional:  Alert and oriented, No acute distress. HEENT: [place] AT, moist mucus membranes.  Trachea midline, no masses. Cardiovascular: No clubbing, cyanosis, or edema. Respiratory: Normal respiratory effort, no increased work of breathing. GI: Abdomen is soft, nontender, nondistended, no abdominal masses, obese with pannus Neurologic: Grossly intact, no focal deficits, moving all 4 extremities. Psychiatric: Normal mood and affect.   Urinalysis Results for orders placed or performed in visit on 02/22/21  Microscopic Examination   Urine  Result Value Ref Range   WBC, UA 0-5 0 - 5 /hpf   RBC 0-2 0 - 2 /hpf   Epithelial Cells (non renal) 0-10 0 - 10 /hpf   Bacteria, UA None seen  None seen/Few  Urinalysis, Complete  Result Value Ref Range   Specific Gravity, UA 1.015 1.005 - 1.030   pH, UA 7.5 5.0 - 7.5   Color, UA Yellow Yellow   Appearance Ur Clear Clear   Leukocytes,UA Negative Negative   Protein,UA Negative Negative/Trace   Glucose, UA Negative Negative   Ketones, UA Negative Negative   RBC, UA Negative Negative   Bilirubin, UA Negative Negative   Urobilinogen, Ur 0.2 0.2 - 1.0 mg/dL   Nitrite, UA Negative Negative   Microscopic Examination See below:   Bladder Scan (Post Void Residual) in office  Result Value Ref Range   Scan Result 15      Assessment & Plan:    1. IC (interstitial  cystitis) Personal history of IC currently in reasonable control primarily with dietary intervention  Uses oxybutynin and Pyridium as needed (short course)  Previous excellent response to cystoscopy/hydrodistention may consider in the future if she has another exacerbation of her symptoms  Considering panniculectomy for compression of her bladder with suprapubic fat pad, support this as this will likely improve her bladder function as there is significant evidence supporting the association between obesity and worsening bladder symptoms especially when the distribution overlies the bladder - Urinalysis, Complete - Bladder Scan (Post Void Residual) in office   Hollice Espy, MD  [facility] [address] [place], [place] [address] [phone]

## 2021-02-22 NOTE — Therapy (Signed)
[facility] [address], [place], [address] Phone: [phone]   Fax:  [phone]  Physical Therapy Treatment  Patient Details  Name: Lynn Dennis MRN: [id number] Date of Birth: 1961-06-05 Referring Provider (PT): Wallace Going, DO   Encounter Date: 02/22/2021   PT End of Session - 02/22/21 1501    Visit Number 8    Number of Visits 17    Date for PT Re-Evaluation 03/10/21    PT Start Time 1501    PT Stop Time 1543    PT Time Calculation (min) 42 min    Equipment Utilized During Treatment Gait belt    Activity Tolerance Patient tolerated treatment well;No increased pain    Behavior During Therapy WFL for tasks assessed/performed           Past Medical History:  Diagnosis Date  . Arthritis    neck  . Cancer (HCC)    squamous cell skin cancer  . GERD (gastroesophageal reflux disease)    only time is when lap band is loosened  . History of hiatal hernia    fixed with lap band surgery  . Pneumonia 2014  . Sleep apnea    uses cpap    Past Surgical History:  Procedure Laterality Date  . ABDOMINAL HYSTERECTOMY    . CYSTO WITH HYDRODISTENSION N/A 03/20/2018   Procedure: CYSTOSCOPY/HYDRODISTENSION;  Surgeon: Hollice Espy, MD;  Location: [facility];  Service: Urology;  Laterality: N/A;  . CYSTOSCOPY WITH BIOPSY N/A 03/20/2018   Procedure: CYSTOSCOPY WITH Bladder BIOPSY;  Surgeon: Hollice Espy, MD;  Location: [facility];  Service: Urology;  Laterality: N/A;  . CYSTOSCOPY WITH FULGERATION N/A 03/20/2018   Procedure: CYSTOSCOPY WITH FULGERATION;  Surgeon: Hollice Espy, MD;  Location: [facility];  Service: Urology;  Laterality: N/A;  . FRACTURE SURGERY     fractured finger  . HEEL SPUR SURGERY Right   . HERNIA REPAIR     age 18 inguinal   . KNEE ARTHROSCOPY Left   . LAPAROSCOPIC GASTRIC BANDING    . SHOULDER ARTHROSCOPY WITH DEBRIDEMENT AND BICEP TENDON REPAIR Right 08/12/2015   Procedure:  SHOULDER ARTHROSCOPY WITH DEBRIDMENT, DECOMPRESSION, SLAP REPAIR, MINI OPEN BICEPS TENDONESIS;  Surgeon: Corky Mull, MD;  Location: [facility];  Service: Orthopedics;  Laterality: Right;  . TONSILLECTOMY      There were no vitals filed for this visit.   Subjective Assessment - 02/22/21 1502    Subjective Back is ok at the moment. Maybe a 2/10 currently. Got a cortisone shot last Wednesday for her L low back.  8/10 when standing from a cross legged sitting position at most for the past 7 days. 5/10 R LE pain at most for the past 7 days.    Pertinent History Low back pain. Gradual onset. Got shots R low back for sciatic pain past couple of years. Has chronically weak abdominal muscles. Had PT for it in the past which helped. Thinks her hernia repair from when she was a kid started. Hoping to get a paniculectomy in the near future. Has DDD in low back. Has interstitial cistitis which increases urniation. No loss of bowel control.    Currently in Pain? Yes    Pain Score 2                                      PT Education -  02/22/21 1507    Education provided Yes    Education Details ther-ex    Person(s) Educated Patient    Methods Explanation;Demonstration;Tactile cues;Verbal cues    Comprehension Returned demonstration;Verbalized understanding          Objective  Possible latex allergy hx of CA No blood pressure problems per pt.   Gait: antalgic, decreased stance R LE, B pelvic drop/trendelenberg  Observation: supine: low back pain (increased lumbar extension posture)  Pt was recommended to perform transversus abdonmins contractions throughout the day.Pt demonstrated and verbalized understanding.  MedbridgeAccess Code [email or web address]  Therapeutic exercise  seated manually resisted R lateral shift isometrics 10x3 with 5 second holds  Seated manually resisted R upper trunk rotation 10x5 seconds for 3 sets   Seated thoracic extension over chair,  hands behind head 10x5 seconds for 2 sets to decrease stress to low back  hooklying posterior pelvic tilt 10x5 seconds for 3 sets  Bridge 10x3   seated manually resisted trunk flexion isometrics in neutral, PT manual resistance 10x3 with 5 second holds   seated hip extension isometrics  L 10x5 seconds for 3 sets  Seated B shoulder extension isometrics, hands on thighs 10x5 seconds for 2 sets to promote trunk strength    Improved exercise technique, movement at target joints, use of target muscles after mod verbal, visual, tactile cues.   Response to treatment Pt tolerated session well without aggravation of symptoms.Feels a back workout instead of pain after session.    Clinical impression Continued working on decreasing lateral shift posture as well as promoting trunk and glute strength and thoracic extension to decrease stress to low back. Pt making progress towards pain goals based on subjective reports. Pt tolerated session well without aggravation of symptoms. Pt will benefit from continued skilled physical therapy services to decrease pain, improve strength and function.             PT Short Term Goals - 01/11/21 1925      PT SHORT TERM GOAL #1   Title Pt will be independent with her initial HEP to decrease pain, improve ability to perform standing tasks as well as tolerate sitting more comfortably.    Time 3    Period Weeks    Status New    Target Date 02/03/21             PT Long Term Goals - 01/11/21 1927      PT LONG TERM GOAL #1   Title Patient will have a decrease in low back pain to 4/10 or less at worst to promote abilty to perform standing tasks as well as tolerate sitting more comfortably.    Baseline 12/10 low back pain at most for the past 3 months (01/11/2021)    Time 8    Period Weeks    Status New    Target Date 03/10/21      PT LONG TERM GOAL #2   Title Pt will have a decrease in R LE pain to 4/10 or less at worst to  promote abilty to perform standing tasks as well as tolerate sitting more comfortably.    Baseline 12/10 R LE pain at most for the past 3 months (01/11/2021)    Time 8    Period Weeks    Status New    Target Date 03/10/21      PT LONG TERM GOAL #3   Title Patient will improve B hip extension and abduction strength by at least 1/2 MMT grade to promote  ability to perform standing tasks more comfortably.    Time 8    Period Weeks    Status New    Target Date 03/10/21      PT LONG TERM GOAL #4   Title Patient will improve her lumbar FOTO score by at least 10 points as a demonstration of improved function.    Baseline Lumbar FOTO 42 (01/11/2021)    Time 8    Period Weeks    Status New    Target Date 03/10/21                 Plan - 02/22/21 1500    Clinical Impression Statement Continued working on decreasing lateral shift posture as well as promoting trunk and glute strength and thoracic extension to decrease stress to low back. Pt making progress towards pain goals based on subjective reports. Pt tolerated session well without aggravation of symptoms. Pt will benefit from continued skilled physical therapy services to decrease pain, improve strength and function.    Personal Factors and Comorbidities Comorbidity 3+;Past/Current Experience;Fitness;Time since onset of injury/illness/exacerbation    Comorbidities Arthritis, CA, sleep apnea, abdominal hysterectomy    Examination-Activity Limitations Carry;Stand;Bed Mobility    Stability/Clinical Decision Making Stable/Uncomplicated    Clinical Decision Making Low    Rehab Potential Fair    PT Frequency 2x / week    PT Duration 8 weeks    PT Treatment/Interventions Therapeutic exercise;Therapeutic activities;Aquatic Therapy;Electrical Stimulation;Iontophoresis 4mg /ml Dexamethasone;Traction;Neuromuscular re-education;Patient/family education;Manual techniques;Dry needling    PT Next Visit Plan posture, thoracic extension, trunk and hip  strengthening, manual techniques, modalities PRN    PT Home Exercise Plan scapular retraction, cervical spine retraction, posture awareness    Consulted and Agree with Plan of Care Patient           Patient will benefit from skilled therapeutic intervention in order to improve the following deficits and impairments:  Pain,Postural dysfunction,Improper body mechanics,Difficulty walking,Decreased strength  Visit Diagnosis: Chronic bilateral low back pain with right-sided sciatica  Muscle weakness (generalized)     Problem List Patient Active Problem List   Diagnosis Date Noted  . Panniculitis 12/31/2020  . Back pain 12/31/2020  . Varicose veins of both lower extremities with pain 11/06/2018  . Bilateral lower extremity edema 11/06/2018    Joneen Boers PT, DPT   02/22/2021, 4:44 PM  [facility] [address] [place], [address] Phone: [phone]   Fax:  [phone]  Name: Lynn Dennis MRN: [id number] Date of Birth: 18-Sep-1961

## 2021-02-24 ENCOUNTER — Ambulatory Visit: Payer: 59

## 2021-02-24 ENCOUNTER — Other Ambulatory Visit: Payer: Self-pay

## 2021-02-24 DIAGNOSIS — M5441 Lumbago with sciatica, right side: Secondary | ICD-10-CM | POA: Diagnosis not present

## 2021-02-24 DIAGNOSIS — M6281 Muscle weakness (generalized): Secondary | ICD-10-CM

## 2021-02-24 DIAGNOSIS — G8929 Other chronic pain: Secondary | ICD-10-CM

## 2021-02-24 NOTE — Therapy (Signed)
[facility] [address], [place], [address] Phone: [phone]   Fax:  [phone]  Physical Therapy Treatment  Patient Details  Name: Lynn Dennis MRN: [id number] Date of Birth: 1961/06/01 Referring Provider (PT): Wallace Going, DO   Encounter Date: 02/24/2021   PT End of Session - 02/24/21 1511    Visit Number 9    Number of Visits 17    Date for PT Re-Evaluation 03/10/21    PT Start Time 1503    PT Stop Time 1542    PT Time Calculation (min) 39 min    Equipment Utilized During Treatment --    Activity Tolerance Patient tolerated treatment well;No increased pain    Behavior During Therapy WFL for tasks assessed/performed           Past Medical History:  Diagnosis Date  . Arthritis    neck  . Cancer (HCC)    squamous cell skin cancer  . GERD (gastroesophageal reflux disease)    only time is when lap band is loosened  . History of hiatal hernia    fixed with lap band surgery  . Pneumonia 2014  . Sleep apnea    uses cpap    Past Surgical History:  Procedure Laterality Date  . ABDOMINAL HYSTERECTOMY    . CYSTO WITH HYDRODISTENSION N/A 03/20/2018   Procedure: CYSTOSCOPY/HYDRODISTENSION;  Surgeon: Hollice Espy, MD;  Location: [facility];  Service: Urology;  Laterality: N/A;  . CYSTOSCOPY WITH BIOPSY N/A 03/20/2018   Procedure: CYSTOSCOPY WITH Bladder BIOPSY;  Surgeon: Hollice Espy, MD;  Location: [facility];  Service: Urology;  Laterality: N/A;  . CYSTOSCOPY WITH FULGERATION N/A 03/20/2018   Procedure: CYSTOSCOPY WITH FULGERATION;  Surgeon: Hollice Espy, MD;  Location: [facility];  Service: Urology;  Laterality: N/A;  . FRACTURE SURGERY     fractured finger  . HEEL SPUR SURGERY Right   . HERNIA REPAIR     age 80 inguinal   . KNEE ARTHROSCOPY Left   . LAPAROSCOPIC GASTRIC BANDING    . SHOULDER ARTHROSCOPY WITH DEBRIDEMENT AND BICEP TENDON REPAIR Right 08/12/2015   Procedure: SHOULDER  ARTHROSCOPY WITH DEBRIDMENT, DECOMPRESSION, SLAP REPAIR, MINI OPEN BICEPS TENDONESIS;  Surgeon: Corky Mull, MD;  Location: [facility];  Service: Orthopedics;  Laterality: Right;  . TONSILLECTOMY      There were no vitals filed for this visit.   Subjective Assessment - 02/24/21 1504    Subjective 3/10 L low back currently. Felt discomfort/bursitis L posterior lateral hip after last session. Used voltaren and feels better now.    Pertinent History Low back pain. Gradual onset. Got shots R low back for sciatic pain past couple of years. Has chronically weak abdominal muscles. Had PT for it in the past which helped. Thinks her hernia repair from when she was a kid started. Hoping to get a paniculectomy in the near future. Has DDD in low back. Has interstitial cistitis which increases urniation. No loss of bowel control.    Currently in Pain? Yes    Pain Score 3                                      PT Education - 02/24/21 1507    Education provided Yes    Education Details ther-ex    [employer]) Educated Patient    Methods Explanation;Demonstration;Tactile cues;Verbal cues  Comprehension Returned demonstration;Verbalized understanding          Objective  Possible latex allergy hx of CA No blood pressure problems per pt.   Gait: antalgic, decreased stance R LE, B pelvic drop/trendelenberg  Observation: supine: low back pain (increased lumbar extension posture)  Pt was recommended to perform transversus abdonmins contractions throughout the day.Pt demonstrated and verbalized understanding.  MedbridgeAccess Code [email or web address]  Therapeutic exercise  Seated thoracic extension over chair, hands behind head 10x5 seconds for 2 sets to decrease stress to low back  seated hip extension isometrics  L 10x5 seconds for 3 sets   seated manually resisted R lateral shift isometrics 10x3 with 5 second holds  Seated manually resisted R upper  trunk rotation 10x5 seconds for3sets   No back or L hip symptoms afterwards.   Standing with B UE assist   Hip abduction    R 10x5 seconds for 2 sets   L 10x5 seconds for 2 sets  Seated B shoulder extension isometrics, hands on thighs 10x5 seconds for 2 sets to promote trunk strength    Improved exercise technique, movement at target joints, use of target muscles after mod verbal, visual, tactile cues.   Response to treatment Pt tolerated session well without aggravation of symptoms.  Clinical impression Continued promoting thoracic extension, decreasing lateral shift and lumbar rotation posture and improving glute strength to help decrease extension stress to low back. Decreased pain after exercises. Pt will benefit from continued skilled physical therapy services to decrease pain, improve strength and functions.        PT Short Term Goals - 01/11/21 1925      PT SHORT TERM GOAL #1   Title Pt will be independent with her initial HEP to decrease pain, improve ability to perform standing tasks as well as tolerate sitting more comfortably.    Time 3    Period Weeks    Status New    Target Date 02/03/21             PT Long Term Goals - 01/11/21 1927      PT LONG TERM GOAL #1   Title Patient will have a decrease in low back pain to 4/10 or less at worst to promote abilty to perform standing tasks as well as tolerate sitting more comfortably.    Baseline 12/10 low back pain at most for the past 3 months (01/11/2021)    Time 8    Period Weeks    Status New    Target Date 03/10/21      PT LONG TERM GOAL #2   Title Pt will have a decrease in R LE pain to 4/10 or less at worst to promote abilty to perform standing tasks as well as tolerate sitting more comfortably.    Baseline 12/10 R LE pain at most for the past 3 months (01/11/2021)    Time 8    Period Weeks    Status New    Target Date 03/10/21      PT LONG TERM GOAL #3   Title Patient will improve B hip  extension and abduction strength by at least 1/2 MMT grade to promote ability to perform standing tasks more comfortably.    Time 8    Period Weeks    Status New    Target Date 03/10/21      PT LONG TERM GOAL #4   Title Patient will improve her lumbar FOTO score by at least 10 points as a demonstration of  improved function.    Baseline Lumbar FOTO 42 (01/11/2021)    Time 8    Period Weeks    Status New    Target Date 03/10/21                 Plan - 02/24/21 1503    Clinical Impression Statement Continued promoting thoracic extension, decreasing lateral shift and lumbar rotation posture and improving glute strength to help decrease extension stress to low back. Decreased pain after exercises. Pt will benefit from continued skilled physical therapy services to decrease pain, improve strength and functions.    Personal Factors and Comorbidities Comorbidity 3+;Past/Current Experience;Fitness;Time since onset of injury/illness/exacerbation    Comorbidities Arthritis, CA, sleep apnea, abdominal hysterectomy    Examination-Activity Limitations Carry;Stand;Bed Mobility    Stability/Clinical Decision Making Stable/Uncomplicated    Rehab Potential Fair    PT Frequency 2x / week    PT Duration 8 weeks    PT Treatment/Interventions Therapeutic exercise;Therapeutic activities;Aquatic Therapy;Electrical Stimulation;Iontophoresis 4mg /ml Dexamethasone;Traction;Neuromuscular re-education;Patient/family education;Manual techniques;Dry needling    PT Next Visit Plan posture, thoracic extension, trunk and hip strengthening, manual techniques, modalities PRN    PT Home Exercise Plan scapular retraction, cervical spine retraction, posture awareness    Consulted and Agree with Plan of Care Patient           Patient will benefit from skilled therapeutic intervention in order to improve the following deficits and impairments:  Pain,Postural dysfunction,Improper body mechanics,Difficulty  walking,Decreased strength  Visit Diagnosis: Chronic bilateral low back pain with right-sided sciatica  Muscle weakness (generalized)     Problem List Patient Active Problem List   Diagnosis Date Noted  . Panniculitis 12/31/2020  . Back pain 12/31/2020  . Varicose veins of both lower extremities with pain 11/06/2018  . Bilateral lower extremity edema 11/06/2018    Joneen Boers PT, DPT   02/24/2021, 6:59 PM  [facility] [address] [place], [address] Phone: [phone]   Fax:  [phone]  Name: Lynn Dennis MRN: [id number] Date of Birth: [birth year]

## 2021-03-01 ENCOUNTER — Ambulatory Visit: Payer: 59

## 2021-03-01 ENCOUNTER — Other Ambulatory Visit: Payer: Self-pay

## 2021-03-01 DIAGNOSIS — G8929 Other chronic pain: Secondary | ICD-10-CM

## 2021-03-01 DIAGNOSIS — M5441 Lumbago with sciatica, right side: Secondary | ICD-10-CM

## 2021-03-01 DIAGNOSIS — M6281 Muscle weakness (generalized): Secondary | ICD-10-CM

## 2021-03-01 NOTE — Therapy (Signed)
[facility] [address] [place], [address] Phone: [phone]   Fax:  [phone]  Physical Therapy Treatment And Progress Report (01/11/2021 - 03/01/2021)  Patient Details  Name: Lynn Dennis MRN: [id number] Date of Birth: 1961/08/30 Referring Provider (PT): Wallace Going, DO   Encounter Date: 03/01/2021    PT End of Session - 03/01/21 1504    Visit Number 10    Number of Visits 17    Date for PT Re-Evaluation 03/10/21    PT Start Time 1505    PT Stop Time 1546    PT Time Calculation (min) 41 min    Activity Tolerance Patient tolerated treatment well;No increased pain    Behavior During Therapy WFL for tasks assessed/performed           Past Medical History:  Diagnosis Date  . Arthritis    neck  . Cancer (HCC)    squamous cell skin cancer  . GERD (gastroesophageal reflux disease)    only time is when lap band is loosened  . History of hiatal hernia    fixed with lap band surgery  . Pneumonia 2014  . Sleep apnea    uses cpap    Past Surgical History:  Procedure Laterality Date  . ABDOMINAL HYSTERECTOMY    . CYSTO WITH HYDRODISTENSION N/A 03/20/2018   Procedure: CYSTOSCOPY/HYDRODISTENSION;  Surgeon: Hollice Espy, MD;  Location: [facility];  Service: Urology;  Laterality: N/A;  . CYSTOSCOPY WITH BIOPSY N/A 03/20/2018   Procedure: CYSTOSCOPY WITH Bladder BIOPSY;  Surgeon: Hollice Espy, MD;  Location: [facility];  Service: Urology;  Laterality: N/A;  . CYSTOSCOPY WITH FULGERATION N/A 03/20/2018   Procedure: CYSTOSCOPY WITH FULGERATION;  Surgeon: Hollice Espy, MD;  Location: [facility];  Service: Urology;  Laterality: N/A;  . FRACTURE SURGERY     fractured finger  . HEEL SPUR SURGERY Right   . HERNIA REPAIR     age 42 inguinal   . KNEE ARTHROSCOPY Left   . LAPAROSCOPIC GASTRIC BANDING    . SHOULDER ARTHROSCOPY WITH DEBRIDEMENT AND BICEP TENDON REPAIR Right 08/12/2015   Procedure:  SHOULDER ARTHROSCOPY WITH DEBRIDMENT, DECOMPRESSION, SLAP REPAIR, MINI OPEN BICEPS TENDONESIS;  Surgeon: Corky Mull, MD;  Location: [facility];  Service: Orthopedics;  Laterality: Right;  . TONSILLECTOMY      There were no vitals filed for this visit.   Subjective Assessment - 03/01/21 1506    Subjective Low back is a 3-4/10 currently.    Pertinent History Low back pain. Gradual onset. Got shots R low back for sciatic pain past couple of years. Has chronically weak abdominal muscles. Had PT for it in the past which helped. Thinks her hernia repair from when she was a kid started. Hoping to get a paniculectomy in the near future. Has DDD in low back. Has interstitial cistitis which increases urniation. No loss of bowel control.    Currently in Pain? Yes    Pain Score 4                                      PT Education - 03/01/21 1523    Education provided Yes    Education Details ther-ex    Person(s) Educated Patient    Methods Explanation;Demonstration;Tactile cues;Verbal cues    Comprehension Returned demonstration;Verbalized understanding  Objective  Possible latex allergy hx of CA No blood pressure problems per pt.   Gait: antalgic, decreased stance R LE, B pelvic drop/trendelenberg  Observation: supine: low back pain (increased lumbar extension posture)  Pt was recommended to perform transversus abdonmins contractions throughout the day.Pt demonstrated and verbalized understanding.  MedbridgeAccess Code [email or web address]  Therapeutic exercise  Standing low rows green band 10x3 with 5 seconds to promote abdominal muscle contraction  Standing B scapular retraction green band 10x5 seconds for 2 sets   Standing with B UE assist              Hip abduction                          R 10x5 seconds                          L 10x5 seconds   Side stepping 32 ft to the R and 32 ft to the L. Good glute med muscle use felt.   Forward  wedding march 32 ft x 2  Seated B shoulder extension isometrics, hands on thighs 10x5 seconds for 2 sets to promote trunk strength.  Seated thoracic extension over chair, hands behind head 10x5 seconds for 2 sets to decrease stress to low back  Seated transversus abdominis contraction 10x5 seconds   seated manually resisted R lateral shift isometrics 10x3 with 5 second holds  Seated manually resisted trunk flexion isometrics in neutral PT manual resistance 10x5 seconds     Improved exercise technique, movement at target joints, use of target muscles after mod verbal, visual, tactile cues.   Response to treatment Pt tolerated session well without aggravation of symptoms.  Clinical impression Pt demonstrates overall decreased low back and R LE pain as well as improved function (improved FOTO score) since initial evaluation. Pt making progress with PT towards goals. Pt will benefit from continued skilled physical therapy services to decrease pain, improve strength and function.      PT Short Term Goals - 03/01/21 1531      PT SHORT TERM GOAL #1   Title Pt will be independent with her initial HEP to decrease pain, improve ability to perform standing tasks as well as tolerate sitting more comfortably.    Baseline No questions with HEP (03/01/2021)    Time 3    Period Weeks    Status Achieved    Target Date 02/03/21             PT Long Term Goals - 03/01/21 1535      PT LONG TERM GOAL #1   Title Patient will have a decrease in low back pain to 4/10 or less at worst to promote abilty to perform standing tasks as well as tolerate sitting more comfortably.    Baseline 12/10 low back pain at most for the past 3 months (01/11/2021); 9/10 low back pain at most for the past 7 days, duration of pain is less (03/01/2021)    Time 8    Period Weeks    Status Partially Met    Target Date 03/10/21      PT LONG TERM GOAL #2   Title Pt will have a decrease in R LE pain to 4/10  or less at worst to promote abilty to perform standing tasks as well as tolerate sitting more comfortably.    Baseline 12/10 R LE pain at most for the past 3  months (01/11/2021); 5/10 R LE pain at most for the past 7 days (03/01/2021)    Time 8    Period Weeks    Status Partially Met    Target Date 03/10/21      PT LONG TERM GOAL #3   Title Patient will improve B hip extension and abduction strength by at least 1/2 MMT grade to promote ability to perform standing tasks more comfortably.    Time 8    Period Weeks    Status On-going    Target Date 03/10/21      PT LONG TERM GOAL #4   Title Patient will improve her lumbar FOTO score by at least 10 points as a demonstration of improved function.    Baseline Lumbar FOTO 42 (01/11/2021); 56 (03/01/2021)    Time 8    Period Weeks    Status Achieved    Target Date 03/10/21                 Plan - 03/01/21 1524    Clinical Impression Statement Pt demonstrates overall decreased low back and R LE pain as well as improved function (improved FOTO score) since initial evaluation. Pt making progress with PT towards goals. Pt will benefit from continued skilled physical therapy services to decrease pain, improve strength and function.    Personal Factors and Comorbidities Comorbidity 3+;Past/Current Experience;Fitness;Time since onset of injury/illness/exacerbation    Comorbidities Arthritis, CA, sleep apnea, abdominal hysterectomy    Examination-Activity Limitations Carry;Stand;Bed Mobility    Stability/Clinical Decision Making Stable/Uncomplicated    Clinical Decision Making Low    Rehab Potential Fair    PT Frequency 2x / week    PT Duration 8 weeks    PT Treatment/Interventions Therapeutic exercise;Therapeutic activities;Aquatic Therapy;Electrical Stimulation;Iontophoresis 91m/ml Dexamethasone;Traction;Neuromuscular re-education;Patient/family education;Manual techniques;Dry needling    PT Next Visit Plan posture, thoracic extension, trunk and  hip strengthening, manual techniques, modalities PRN    PT Home Exercise Plan scapular retraction, cervical spine retraction, posture awareness    Consulted and Agree with Plan of Care Patient           Patient will benefit from skilled therapeutic intervention in order to improve the following deficits and impairments:  Pain,Postural dysfunction,Improper body mechanics,Difficulty walking,Decreased strength  Visit Diagnosis: Chronic bilateral low back pain with right-sided sciatica  Muscle weakness (generalized)     Problem List Patient Active Problem List   Diagnosis Date Noted  . Panniculitis 12/31/2020  . Back pain 12/31/2020  . Varicose veins of both lower extremities with pain 11/06/2018  . Bilateral lower extremity edema 11/06/2018   Thank you for your referral.   MJoneen BoersPT, DPT   03/01/2021, 4:12 PM  [facility] [address] [place] [address]Phone: [phone]  Fax:  [phone] Name: Lynn KOHUTMRN: [id number] of Birth: 1August 22, 1962

## 2021-03-03 ENCOUNTER — Ambulatory Visit: Payer: 59

## 2021-03-03 ENCOUNTER — Other Ambulatory Visit: Payer: Self-pay

## 2021-03-03 DIAGNOSIS — M6281 Muscle weakness (generalized): Secondary | ICD-10-CM

## 2021-03-03 DIAGNOSIS — G8929 Other chronic pain: Secondary | ICD-10-CM

## 2021-03-03 DIAGNOSIS — M5441 Lumbago with sciatica, right side: Secondary | ICD-10-CM

## 2021-03-03 NOTE — Therapy (Signed)
[facility] [address] [place], [address] Phone: [phone]   Fax:  [phone]  Physical Therapy Treatment  Patient Details  Name: Lynn Dennis MRN: [id number] Date of Birth: 1960-12-27 Referring Provider (PT): Wallace Going, DO   Encounter Date: 03/03/2021   PT End of Session - 03/03/21 1550    Visit Number 11    Number of Visits 17    Date for PT Re-Evaluation 03/10/21    PT Start Time 5726    PT Stop Time 1629    PT Time Calculation (min) 39 min    Activity Tolerance Patient tolerated treatment well;No increased pain    Behavior During Therapy WFL for tasks assessed/performed           Past Medical History:  Diagnosis Date  . Arthritis    neck  . Cancer (HCC)    squamous cell skin cancer  . GERD (gastroesophageal reflux disease)    only time is when lap band is loosened  . History of hiatal hernia    fixed with lap band surgery  . Pneumonia 2014  . Sleep apnea    uses cpap    Past Surgical History:  Procedure Laterality Date  . ABDOMINAL HYSTERECTOMY    . CYSTO WITH HYDRODISTENSION N/A 03/20/2018   Procedure: CYSTOSCOPY/HYDRODISTENSION;  Surgeon: Hollice Espy, MD;  Location: [facility];  Service: Urology;  Laterality: N/A;  . CYSTOSCOPY WITH BIOPSY N/A 03/20/2018   Procedure: CYSTOSCOPY WITH Bladder BIOPSY;  Surgeon: Hollice Espy, MD;  Location: [facility];  Service: Urology;  Laterality: N/A;  . CYSTOSCOPY WITH FULGERATION N/A 03/20/2018   Procedure: CYSTOSCOPY WITH FULGERATION;  Surgeon: Hollice Espy, MD;  Location: [facility];  Service: Urology;  Laterality: N/A;  . FRACTURE SURGERY     fractured finger  . HEEL SPUR SURGERY Right   . HERNIA REPAIR     age 60 inguinal   . KNEE ARTHROSCOPY Left   . LAPAROSCOPIC GASTRIC BANDING    . SHOULDER ARTHROSCOPY WITH DEBRIDEMENT AND BICEP TENDON REPAIR Right 08/12/2015   Procedure: SHOULDER ARTHROSCOPY WITH DEBRIDMENT,  DECOMPRESSION, SLAP REPAIR, MINI OPEN BICEPS TENDONESIS;  Surgeon: Corky Mull, MD;  Location: [facility];  Service: Orthopedics;  Laterality: Right;  . TONSILLECTOMY      There were no vitals filed for this visit.   Subjective Assessment - 03/03/21 1551    Subjective Back is maybe a 2/10 currently. Was ok after last sesssion. Standing in line at [employer] for about 10 minutes is more comfortable for her back.    Pertinent History Low back pain. Gradual onset. Got shots R low back for sciatic pain past couple of years. Has chronically weak abdominal muscles. Had PT for it in the past which helped. Thinks her hernia repair from when she was a kid started. Hoping to get a paniculectomy in the near future. Has DDD in low back. Has interstitial cistitis which increases urniation. No loss of bowel control.    Currently in Pain? Yes    Pain Score 2     Pain Location Back                                     PT Education - 03/03/21 1553    Education provided Yes    Education Details ther-ex    Person(s) Educated Patient    Methods Explanation;Demonstration;Tactile cues;Verbal  cues    Comprehension Returned demonstration;Verbalized understanding          Objective  Possible latex allergy hx of CA No blood pressure problems per pt.   Gait: antalgic, decreased stance R LE, B pelvic drop/trendelenberg  Observation: supine: low back pain (increased lumbar extension posture)  Pt was recommended to perform transversus abdonmins contractions throughout the day.Pt demonstrated and verbalized understanding.  MedbridgeAccess Code [email or web address]  Therapeutic exercise  Standing with B UE assist  Hip abduction  R 10x5 seconds for 2 sets L 10x5 seconds for 2 sets  Seated B shoulder extension isometrics, hands on thighs 10x5 seconds for 3 sets to promote trunk strength.   seated manually resisted R lateral  shift isometrics 10x3 with 5 second holds  Seated manually resisted trunk flexion isometrics in neutral PT manual resistance 10x5 seconds for 3 sets  Seated hip extension isometrics   R 10x5 seconds for 3 sets  L 10x5 seconds for 3 sets  Standing B shoulder extension with scapular retraction green band 10x5 seconds for 3 sets    Side stepping 32 ft to the R and 32 ft to the L. Good glute med muscle use felt.     Improved exercise technique, movement at target joints, use of target muscles after mod verbal, visual, tactile cues.   Response to treatment Pt tolerated session well without aggravation of symptoms.  Clinical impression Decreasing overall starting back pain level compared to initial values when PT was first started. Continued working on improving trunk posture, trunk and glute strength as well as thoracic extension to help decrease stress to low back with standing activities and positions. Pt tolerated session well without aggravation of symptoms. Pt will benefit from continued skilled physical therapy services to decrease pain, improve strength and function .    PT Short Term Goals - 03/01/21 1531      PT SHORT TERM GOAL #1   Title Pt will be independent with her initial HEP to decrease pain, improve ability to perform standing tasks as well as tolerate sitting more comfortably.    Baseline No questions with HEP (03/01/2021)    Time 3    Period Weeks    Status Achieved    Target Date 02/03/21             PT Long Term Goals - 03/01/21 1535      PT LONG TERM GOAL #1   Title Patient will have a decrease in low back pain to 4/10 or less at worst to promote abilty to perform standing tasks as well as tolerate sitting more comfortably.    Baseline 12/10 low back pain at most for the past 3 months (01/11/2021); 9/10 low back pain at most for the past 7 days, duration of pain is less (03/01/2021)    Time 8    Period Weeks    Status Partially Met    Target Date  03/10/21      PT LONG TERM GOAL #2   Title Pt will have a decrease in R LE pain to 4/10 or less at worst to promote abilty to perform standing tasks as well as tolerate sitting more comfortably.    Baseline 12/10 R LE pain at most for the past 3 months (01/11/2021); 5/10 R LE pain at most for the past 7 days (03/01/2021)    Time 8    Period Weeks    Status Partially Met    Target Date 03/10/21      PT  LONG TERM GOAL #3   Title Patient will improve B hip extension and abduction strength by at least 1/2 MMT grade to promote ability to perform standing tasks more comfortably.    Time 8    Period Weeks    Status On-going    Target Date 03/10/21      PT LONG TERM GOAL #4   Title Patient will improve her lumbar FOTO score by at least 10 points as a demonstration of improved function.    Baseline Lumbar FOTO 42 (01/11/2021); 56 (03/01/2021)    Time 8    Period Weeks    Status Achieved    Target Date 03/10/21                 Plan - 03/03/21 1554    Clinical Impression Statement Decreasing overall starting back pain level compared to initial values when PT was first started. Continued working on improving trunk posture, trunk and glute strength as well as thoracic extension to help decrease stress to low back with standing activities and positions. Pt tolerated session well without aggravation of symptoms. Pt will benefit from continued skilled physical therapy services to decrease pain, improve strength and function .    Personal Factors and Comorbidities Comorbidity 3+;Past/Current Experience;Fitness;Time since onset of injury/illness/exacerbation    Comorbidities Arthritis, CA, sleep apnea, abdominal hysterectomy    Examination-Activity Limitations Carry;Stand;Bed Mobility    Stability/Clinical Decision Making Stable/Uncomplicated    Clinical Decision Making Low    Rehab Potential Fair    PT Frequency 2x / week    PT Duration 8 weeks    PT Treatment/Interventions Therapeutic  exercise;Therapeutic activities;Aquatic Therapy;Electrical Stimulation;Iontophoresis 4mg /ml Dexamethasone;Traction;Neuromuscular re-education;Patient/family education;Manual techniques;Dry needling    PT Next Visit Plan posture, thoracic extension, trunk and hip strengthening, manual techniques, modalities PRN    PT Home Exercise Plan scapular retraction, cervical spine retraction, posture awareness    Consulted and Agree with Plan of Care Patient           Patient will benefit from skilled therapeutic intervention in order to improve the following deficits and impairments:  Pain,Postural dysfunction,Improper body mechanics,Difficulty walking,Decreased strength  Visit Diagnosis: Chronic bilateral low back pain with right-sided sciatica  Muscle weakness (generalized)     Problem List Patient Active Problem List   Diagnosis Date Noted  . Panniculitis 12/31/2020  . Back pain 12/31/2020  . Varicose veins of both lower extremities with pain 11/06/2018  . Bilateral lower extremity edema 11/06/2018    Joneen Boers PT, DPT   03/03/2021, 6:49 PM  [facility] [address], [place], [address] Phone: [phone]   Fax:  [phone]  Name: NIASIA LANPHEAR MRN: [id number] Date of Birth: 1961-03-29

## 2021-03-08 ENCOUNTER — Ambulatory Visit: Payer: 59

## 2021-03-08 ENCOUNTER — Other Ambulatory Visit: Payer: Self-pay

## 2021-03-08 DIAGNOSIS — M6281 Muscle weakness (generalized): Secondary | ICD-10-CM

## 2021-03-08 DIAGNOSIS — M5441 Lumbago with sciatica, right side: Secondary | ICD-10-CM

## 2021-03-08 NOTE — Therapy (Signed)
[facility] [address], [place], [address] Phone: [phone]   Fax:  [phone]  Physical Therapy Treatment  Patient Details  Name: Lynn Dennis MRN: [id number] Date of Birth: 1961-06-28 Referring Provider (PT): Marla Roe Loel Lofty, DO   Encounter Date: 03/08/2021   PT End of Session - 03/08/21 1638    Visit Number 12    Number of Visits 25    Date for PT Re-Evaluation 04/07/21    PT Start Time 1655    PT Stop Time 1718    PT Time Calculation (min) 40 min    Activity Tolerance Patient tolerated treatment well;No increased pain    Behavior During Therapy WFL for tasks assessed/performed           Past Medical History:  Diagnosis Date  . Arthritis    neck  . Cancer (HCC)    squamous cell skin cancer  . GERD (gastroesophageal reflux disease)    only time is when lap band is loosened  . History of hiatal hernia    fixed with lap band surgery  . Pneumonia 2014  . Sleep apnea    uses cpap    Past Surgical History:  Procedure Laterality Date  . ABDOMINAL HYSTERECTOMY    . CYSTO WITH HYDRODISTENSION N/A 03/20/2018   Procedure: CYSTOSCOPY/HYDRODISTENSION;  Surgeon: Hollice Espy, MD;  Location: [facility];  Service: Urology;  Laterality: N/A;  . CYSTOSCOPY WITH BIOPSY N/A 03/20/2018   Procedure: CYSTOSCOPY WITH Bladder BIOPSY;  Surgeon: Hollice Espy, MD;  Location: [facility];  Service: Urology;  Laterality: N/A;  . CYSTOSCOPY WITH FULGERATION N/A 03/20/2018   Procedure: CYSTOSCOPY WITH FULGERATION;  Surgeon: Hollice Espy, MD;  Location: [facility];  Service: Urology;  Laterality: N/A;  . FRACTURE SURGERY     fractured finger  . HEEL SPUR SURGERY Right   . HERNIA REPAIR     age 39 inguinal   . KNEE ARTHROSCOPY Left   . LAPAROSCOPIC GASTRIC BANDING    . SHOULDER ARTHROSCOPY WITH DEBRIDEMENT AND BICEP TENDON REPAIR Right 08/12/2015   Procedure: SHOULDER ARTHROSCOPY WITH DEBRIDMENT,  DECOMPRESSION, SLAP REPAIR, MINI OPEN BICEPS TENDONESIS;  Surgeon: Corky Mull, MD;  Location: [facility];  Service: Orthopedics;  Laterality: Right;  . TONSILLECTOMY      There were no vitals filed for this visit.   Subjective Assessment - 03/08/21 1639    Subjective Back is doing ok. Just woke up. Has been having problems in R foot at the arch. 8/10 at most for the past 7 days.    Pertinent History Low back pain. Gradual onset. Got shots R low back for sciatic pain past couple of years. Has chronically weak abdominal muscles. Had PT for it in the past which helped. Thinks her hernia repair from when she was a kid started. Hoping to get a paniculectomy in the near future. Has DDD in low back. Has interstitial cistitis which increases urniation. No loss of bowel control.    Currently in Pain? No/denies                                     PT Education - 03/08/21 3748    Education provided Yes    Education Details ther-ex    [employer]) Educated Patient    Methods Explanation;Demonstration;Tactile cues;Verbal cues    Comprehension Returned demonstration;Verbalized understanding  Objective  Possible latex allergy hx of CA No blood pressure problems per pt.   Gait: antalgic, decreased stance R LE, B pelvic drop/trendelenberg  Observation: supine: low back pain (increased lumbar extension posture)  Pt was recommended to perform transversus abdonmins contractions throughout the day.Pt demonstrated and verbalized understanding.  MedbridgeAccess Code [email or web address]  Therapeutic exercise  seated manually resisted R lateral shift isometrics 10x3 with 5 second holds  Seated manually resisted trunk flexion isometrics in neutral PT manual resistance 10x5 secondsfor 3 sets  Seated B shoulder extension isometrics, hands on thighs 10x5 seconds for 3 sets to promote trunk strength.  Reviewed POC: continue 2x/week for another 4 weeks to continue  progress.  SLS to promote glute med muscle strengthening with occasional UE assist   R 10x5 seconds for 2 sets  L 10x5 seconds for 2 sets   Seated hip extension isometrics              R 10x5 seconds for 3 sets             L 10x5 seconds for 3 sets  Standing B shoulder extension with scapular retraction green band 10x5 seconds for 3 sets   Side stepping 32 ft to the R and 32 ft to the L.   Improved exercise technique, movement at target joints, use of target muscles after mod verbal, visual, tactile cues.   Response to treatment Pt tolerated session well without aggravation of symptoms.  Clinical impression Pt making good progress with decreasing overall back pain based on subjective reports of worst pain level decreasing to an 8/10 for the past 7 days. Pt also demonstrates improved function and decreased R LE pain as well. Pt still demonstrates core and hip weakness, back pain and will benefit from continued skilled physical therapy services to address the aforementioned deficits.          PT Short Term Goals - 03/01/21 1531      PT SHORT TERM GOAL #1   Title Pt will be independent with her initial HEP to decrease pain, improve ability to perform standing tasks as well as tolerate sitting more comfortably.    Baseline No questions with HEP (03/01/2021)    Time 3    Period Weeks    Status Achieved    Target Date 02/03/21             PT Long Term Goals - 03/08/21 1903      PT LONG TERM GOAL #1   Title Patient will have a decrease in low back pain to 4/10 or less at worst to promote abilty to perform standing tasks as well as tolerate sitting more comfortably.    Baseline 12/10 low back pain at most for the past 3 months (01/11/2021); 9/10 low back pain at most for the past 7 days, duration of pain is less (03/01/2021); 8/10 at most for the past 7 days (03/08/2021)    Time 4    Period Weeks    Status Partially Met    Target Date 04/07/21      PT LONG TERM  GOAL #2   Title Pt will have a decrease in R LE pain to 4/10 or less at worst to promote abilty to perform standing tasks as well as tolerate sitting more comfortably.    Baseline 12/10 R LE pain at most for the past 3 months (01/11/2021); 5/10 R LE pain at most for the past 7 days (03/01/2021)    Time 4  Period Weeks    Status Partially Met    Target Date 04/07/21      PT LONG TERM GOAL #3   Title Patient will improve B hip extension and abduction strength by at least 1/2 MMT grade to promote ability to perform standing tasks more comfortably.    Time 4    Period Weeks    Status On-going    Target Date 04/07/21      PT LONG TERM GOAL #4   Title Patient will improve her lumbar FOTO score by at least 10 points as a demonstration of improved function.    Baseline Lumbar FOTO 42 (01/11/2021); 56 (03/01/2021)    Time 4    Period Weeks    Status Achieved    Target Date 04/05/21                 Plan - 03/08/21 1650    Clinical Impression Statement Pt making good progress with decreasing overall back pain based on subjective reports of worst pain level decreasing to an 8/10 for the past 7 days. Pt also demonstrates improved function and decreased R LE pain as well. Pt still demonstrates core and hip weakness, back pain and will benefit from continued skilled physical therapy services to address the aforementioned deficits.    Personal Factors and Comorbidities Comorbidity 3+;Past/Current Experience;Fitness;Time since onset of injury/illness/exacerbation    Comorbidities Arthritis, CA, sleep apnea, abdominal hysterectomy    Examination-Activity Limitations Carry;Stand;Bed Mobility    Stability/Clinical Decision Making Stable/Uncomplicated    Clinical Decision Making Low    Rehab Potential Fair    PT Frequency 2x / week    PT Duration 4 weeks    PT Treatment/Interventions Therapeutic exercise;Therapeutic activities;Aquatic Therapy;Electrical Stimulation;Iontophoresis 4mg /ml  Dexamethasone;Traction;Neuromuscular re-education;Patient/family education;Manual techniques;Dry needling    PT Next Visit Plan posture, thoracic extension, trunk and hip strengthening, manual techniques, modalities PRN    PT Home Exercise Plan scapular retraction, cervical spine retraction, posture awareness    Consulted and Agree with Plan of Care Patient           Patient will benefit from skilled therapeutic intervention in order to improve the following deficits and impairments:  Pain,Postural dysfunction,Improper body mechanics,Difficulty walking,Decreased strength  Visit Diagnosis: Chronic bilateral low back pain with right-sided sciatica - Plan: PT plan of care cert/re-cert  Muscle weakness (generalized) - Plan: PT plan of care cert/re-cert     Problem List Patient Active Problem List   Diagnosis Date Noted  . Panniculitis 12/31/2020  . Back pain 12/31/2020  . Varicose veins of both lower extremities with pain 11/06/2018  . Bilateral lower extremity edema 11/06/2018   Joneen Boers PT, DPT  03/08/2021, 7:10 PM  [facility] [address], [place], [address] Phone: [phone]   Fax:  [phone]  Name: Lynn Dennis MRN: [id number] Date of Birth: 18-Jun-1961

## 2021-03-10 ENCOUNTER — Other Ambulatory Visit: Payer: Self-pay

## 2021-03-10 ENCOUNTER — Ambulatory Visit: Payer: 59

## 2021-03-10 DIAGNOSIS — M5441 Lumbago with sciatica, right side: Secondary | ICD-10-CM | POA: Diagnosis not present

## 2021-03-10 DIAGNOSIS — G8929 Other chronic pain: Secondary | ICD-10-CM

## 2021-03-10 DIAGNOSIS — M6281 Muscle weakness (generalized): Secondary | ICD-10-CM

## 2021-03-10 NOTE — Therapy (Signed)
[facility] [address], [place], [address] Phone: [phone]   Fax:  [phone]  Physical Therapy Treatment  Patient Details  Name: Lynn Dennis MRN: [id number] Date of Birth: 07-03-1961 Referring Provider (PT): Marla Roe Loel Lofty, DO   Encounter Date: 03/10/2021   PT End of Session - 03/10/21 1422    Visit Number 13    Number of Visits 25    Date for PT Re-Evaluation 04/07/21    PT Start Time 5830    PT Stop Time 1500    PT Time Calculation (min) 38 min    Activity Tolerance Patient tolerated treatment well;No increased pain    Behavior During Therapy WFL for tasks assessed/performed           Past Medical History:  Diagnosis Date  . Arthritis    neck  . Cancer (HCC)    squamous cell skin cancer  . GERD (gastroesophageal reflux disease)    only time is when lap band is loosened  . History of hiatal hernia    fixed with lap band surgery  . Pneumonia 2014  . Sleep apnea    uses cpap    Past Surgical History:  Procedure Laterality Date  . ABDOMINAL HYSTERECTOMY    . CYSTO WITH HYDRODISTENSION N/A 03/20/2018   Procedure: CYSTOSCOPY/HYDRODISTENSION;  Surgeon: Hollice Espy, MD;  Location: [facility];  Service: Urology;  Laterality: N/A;  . CYSTOSCOPY WITH BIOPSY N/A 03/20/2018   Procedure: CYSTOSCOPY WITH Bladder BIOPSY;  Surgeon: Hollice Espy, MD;  Location: [facility];  Service: Urology;  Laterality: N/A;  . CYSTOSCOPY WITH FULGERATION N/A 03/20/2018   Procedure: CYSTOSCOPY WITH FULGERATION;  Surgeon: Hollice Espy, MD;  Location: [facility];  Service: Urology;  Laterality: N/A;  . FRACTURE SURGERY     fractured finger  . HEEL SPUR SURGERY Right   . HERNIA REPAIR     age 25 inguinal   . KNEE ARTHROSCOPY Left   . LAPAROSCOPIC GASTRIC BANDING    . SHOULDER ARTHROSCOPY WITH DEBRIDEMENT AND BICEP TENDON REPAIR Right 08/12/2015   Procedure: SHOULDER ARTHROSCOPY WITH DEBRIDMENT,  DECOMPRESSION, SLAP REPAIR, MINI OPEN BICEPS TENDONESIS;  Surgeon: Corky Mull, MD;  Location: [facility];  Service: Orthopedics;  Laterality: Right;  . TONSILLECTOMY      There were no vitals filed for this visit.   Subjective Assessment - 03/10/21 1424    Subjective Back is coming a long. Trying to do her exercises.    Pertinent History Low back pain. Gradual onset. Got shots R low back for sciatic pain past couple of years. Has chronically weak abdominal muscles. Had PT for it in the past which helped. Thinks her hernia repair from when she was a kid started. Hoping to get a paniculectomy in the near future. Has DDD in low back. Has interstitial cistitis which increases urniation. No loss of bowel control.    Currently in Pain? Yes    Pain Score 3     Pain Location Back                                     PT Education - 03/10/21 1426    Education provided Yes    Education Details ther-ex    Person(s) Educated Patient    Methods Explanation;Demonstration;Tactile cues;Verbal cues    Comprehension Returned demonstration;Verbalized understanding  Objective  Possible latex allergy hx of CA No blood pressure problems per pt.   Gait: antalgic, decreased stance R LE, B pelvic drop/trendelenberg  Observation: supine: low back pain (increased lumbar extension posture)  Pt was recommended to perform transversus abdonmins contractions throughout the day.Pt demonstrated and verbalized understanding.  MedbridgeAccess Code [email or web address]  Therapeutic exercise  seated manually resisted R lateral shift isometrics 10x3 with 5 second holds  Seated manually resisted trunk flexion isometrics in neutral PT manual resistance 10x5 secondsfor 3 sets  Seated manually resisted R upper trunk rotation to promote more neutral lumbar posture 10x5 seconds for 3 sets  No back pain afterwards.   Seated hip extension isometrics  R 10x5 seconds  for 3 sets L 10x5 seconds for 3 sets  Side stepping 32 ft to the R and 32 ft to the L. for 2 sets. Good glute med muscle use felt   SLS to promote glute med muscle strengthening with occasional UE assist              R 10x5 seconds for 2 sets             L 10x5 seconds for 2 sets   Seated B shoulder extension isometrics, hands on thighs 10x5 seconds for2sets to promote trunk strength.   Improved exercise technique, movement at target joints, use of target muscles after mod verbal, visual, tactile cues.   Response to treatment Pt tolerated session well without aggravation of symptoms.  Clinical impression Decreased back pain with treatment to promote more neutral lumbar posture. Continued working on improving trunk and glute strength to help decrease stress to low back with standing activities. Pt tolerated session well without aggravation of symptoms. Pt will benefit from continued skilled physical therapy services to decrease pain, improve strength and function.        PT Short Term Goals - 03/01/21 1531      PT SHORT TERM GOAL #1   Title Pt will be independent with her initial HEP to decrease pain, improve ability to perform standing tasks as well as tolerate sitting more comfortably.    Baseline No questions with HEP (03/01/2021)    Time 3    Period Weeks    Status Achieved    Target Date 02/03/21             PT Long Term Goals - 03/08/21 1903      PT LONG TERM GOAL #1   Title Patient will have a decrease in low back pain to 4/10 or less at worst to promote abilty to perform standing tasks as well as tolerate sitting more comfortably.    Baseline 12/10 low back pain at most for the past 3 months (01/11/2021); 9/10 low back pain at most for the past 7 days, duration of pain is less (03/01/2021); 8/10 at most for the past 7 days (03/08/2021)    Time 4    Period Weeks    Status Partially Met    Target Date 04/07/21      PT LONG TERM GOAL #2    Title Pt will have a decrease in R LE pain to 4/10 or less at worst to promote abilty to perform standing tasks as well as tolerate sitting more comfortably.    Baseline 12/10 R LE pain at most for the past 3 months (01/11/2021); 5/10 R LE pain at most for the past 7 days (03/01/2021)    Time 4    Period Weeks    Status  Partially Met    Target Date 04/07/21      PT LONG TERM GOAL #3   Title Patient will improve B hip extension and abduction strength by at least 1/2 MMT grade to promote ability to perform standing tasks more comfortably.    Time 4    Period Weeks    Status On-going    Target Date 04/07/21      PT LONG TERM GOAL #4   Title Patient will improve her lumbar FOTO score by at least 10 points as a demonstration of improved function.    Baseline Lumbar FOTO 42 (01/11/2021); 56 (03/01/2021)    Time 4    Period Weeks    Status Achieved    Target Date 04/05/21                 Plan - 03/10/21 1427    Clinical Impression Statement Decreased back pain with treatment to promote more neutral lumbar posture. Continued working on improving trunk and glute strength to help decrease stress to low back with standing activities. Pt tolerated session well without aggravation of symptoms. Pt will benefit from continued skilled physical therapy services to decrease pain, improve strength and function.    Personal Factors and Comorbidities Comorbidity 3+;Past/Current Experience;Fitness;Time since onset of injury/illness/exacerbation    Comorbidities Arthritis, CA, sleep apnea, abdominal hysterectomy    Examination-Activity Limitations Carry;Stand;Bed Mobility    Stability/Clinical Decision Making Stable/Uncomplicated    Rehab Potential Fair    PT Frequency 2x / week    PT Duration 4 weeks    PT Treatment/Interventions Therapeutic exercise;Therapeutic activities;Aquatic Therapy;Electrical Stimulation;Iontophoresis 64m/ml Dexamethasone;Traction;Neuromuscular re-education;Patient/family  education;Manual techniques;Dry needling    PT Next Visit Plan posture, thoracic extension, trunk and hip strengthening, manual techniques, modalities PRN    PT Home Exercise Plan scapular retraction, cervical spine retraction, posture awareness    Consulted and Agree with Plan of Care Patient           Patient will benefit from skilled therapeutic intervention in order to improve the following deficits and impairments:  Pain,Postural dysfunction,Improper body mechanics,Difficulty walking,Decreased strength  Visit Diagnosis: Chronic bilateral low back pain with right-sided sciatica  Muscle weakness (generalized)     Problem List Patient Active Problem List   Diagnosis Date Noted  . Panniculitis 12/31/2020  . Back pain 12/31/2020  . Varicose veins of both lower extremities with pain 11/06/2018  . Bilateral lower extremity edema 11/06/2018    MJoneen BoersPT, DPT   03/10/2021, 3:01 PM  [facility] [address] [place] [address]Phone: [phone]  Fax:  [phone] Name: Lynn LEISEYMRN: [id number] of Birth: 1Sep 26, 1962

## 2021-03-15 ENCOUNTER — Ambulatory Visit: Payer: 59

## 2021-03-22 ENCOUNTER — Encounter: Payer: Self-pay | Admitting: Plastic Surgery

## 2021-03-23 ENCOUNTER — Other Ambulatory Visit: Payer: Self-pay

## 2021-03-23 ENCOUNTER — Encounter: Payer: Self-pay | Admitting: Plastic Surgery

## 2021-03-23 ENCOUNTER — Telehealth (INDEPENDENT_AMBULATORY_CARE_PROVIDER_SITE_OTHER): Payer: 59 | Admitting: Plastic Surgery

## 2021-03-23 DIAGNOSIS — M793 Panniculitis, unspecified: Secondary | ICD-10-CM

## 2021-03-23 NOTE — Progress Notes (Signed)
The patient is a 60 year old female joining me by phone for further discussion about her panniculectomy.  She has a lap band in place and has a marked and a picture in the computer.  Its on the left mid abdomen area.  She is interested in adding liposuction to her procedure.  This is possible and I am willing to do it.  She does need to understand that it will be a slight benefit and nothing that is dramatic.  She understands that her has expressed understanding it and will talk to [facility] about the possibility of adding it.

## 2021-04-01 ENCOUNTER — Telehealth: Payer: 59 | Admitting: Plastic Surgery

## 2021-04-10 HISTORY — PX: PANNICULECTOMY: SUR1001

## 2021-04-12 ENCOUNTER — Other Ambulatory Visit: Payer: Self-pay

## 2021-04-12 ENCOUNTER — Ambulatory Visit (INDEPENDENT_AMBULATORY_CARE_PROVIDER_SITE_OTHER): Payer: 59 | Admitting: Surgical

## 2021-04-12 ENCOUNTER — Encounter: Payer: Self-pay | Admitting: Surgical

## 2021-04-12 ENCOUNTER — Encounter: Payer: Self-pay | Admitting: Plastic Surgery

## 2021-04-12 VITALS — BP 116/75 | HR 99 | Ht 63.0 in | Wt 170.8 lb

## 2021-04-12 DIAGNOSIS — M793 Panniculitis, unspecified: Secondary | ICD-10-CM

## 2021-04-12 DIAGNOSIS — M546 Pain in thoracic spine: Secondary | ICD-10-CM

## 2021-04-12 DIAGNOSIS — G8929 Other chronic pain: Secondary | ICD-10-CM

## 2021-04-12 NOTE — Progress Notes (Signed)
Patient ID: Lynn Dennis, female    DOB: 03/18/1961, 60 y.o.   MRN: [id number]  Chief Complaint  Patient presents with  . Pre-op Exam      ICD-10-CM   1. Panniculitis  M79.3   2. Chronic bilateral thoracic back pain  M54.6    G89.29     History of Present Illness: Lynn Dennis is a 60 y.o.  female  with a history of overhanging abdominal pannus.  She presents for preoperative evaluation for upcoming procedure, panniculectomy with possible upper abdominoplasty add-on, scheduled for 05/02/2021 with Dr. Marla Roe.  The patient has not had problems with anesthesia. No history of DVT/PE.  No family history of DVT/PE.  No family or personal history of bleeding or clotting disorders.  Patient is not currently taking any blood thinners.  No history of CVA/MI.   Summary of Previous Visit: Patient has lost approximately 50 pounds in the past year to 2 years and has been able to keep this weight off.  She is not a smoker.  She is not a diabetic.  She is not on blood thinners.  PMH Significant for: Anxiety, depression, interstitial cystitis, celiac disease, history of varicose veins, GERD, sleep apnea. Patient reports that she technically no longer has sleep apnea after her weight loss, but continues to wear the CPAP because she feels as if it helps her.  She reports that her pulmonologist has stated that it is not necessary for her to use any longer.    Past Medical History: Allergies: Allergies  Allergen Reactions  . Prednisone Other (See Comments)    hallucinantions  . Adhesive [Tape] Other (See Comments)    Skin discomfort with extended use  . Gluten Meal     Gluten intolerant  . Lactose Intolerance (Gi) Other (See Comments)    Gi upset  . Nsaids Other (See Comments)    Patient has lap band    Current Medications:  Current Outpatient Medications:  .  acetaminophen (TYLENOL) 500 MG tablet, Take 1,000 mg by mouth every 6 (six) hours as needed for moderate pain or headache.,  Disp: , Rfl:  .  carboxymethylcellulose (REFRESH PLUS) 0.5 % SOLN, Place 1 drop into both eyes at bedtime., Disp: , Rfl:  .  escitalopram (LEXAPRO) 10 MG tablet, Take 1/2 tablet by mouth at bedtime., Disp: , Rfl:  .  estradiol (ESTRACE) 1 MG tablet, Take 1 mg by mouth daily., Disp: , Rfl:  .  hydrOXYzine (ATARAX/VISTARIL) 10 MG tablet, Take by mouth., Disp: , Rfl:  .  metroNIDAZOLE (METROGEL) 1 % gel, Apply topically., Disp: , Rfl:  .  Multiple Vitamin (MULTI-VITAMINS) TABS, Take 1 tablet by mouth daily. , Disp: , Rfl:  .  Omega-3 Fatty Acids (FISH OIL PO), Take 1 tablet by mouth daily., Disp: , Rfl:  .  Phenazopyridine HCl 97.5 MG TABS, Take 1 tablet by mouth at bedtime as needed (for urinary pain relief). , Disp: , Rfl:  .  triamcinolone cream (KENALOG) 0.1 %, Apply 1 application topically daily as needed (rash). , Disp: , Rfl:  .  YUVAFEM 10 MCG TABS vaginal tablet, , Disp: , Rfl:   Past Medical Problems: Past Medical History:  Diagnosis Date  . Arthritis    neck  . Cancer (HCC)    squamous cell skin cancer  . GERD (gastroesophageal reflux disease)    only time is when lap band is loosened  . History of hiatal hernia    fixed with lap  band surgery  . Pneumonia 2014  . Sleep apnea    uses cpap    Past Surgical History: Past Surgical History:  Procedure Laterality Date  . ABDOMINAL HYSTERECTOMY    . CYSTO WITH HYDRODISTENSION N/A 03/20/2018   Procedure: CYSTOSCOPY/HYDRODISTENSION;  Surgeon: Hollice Espy, MD;  Location: [facility];  Service: Urology;  Laterality: N/A;  . CYSTOSCOPY WITH BIOPSY N/A 03/20/2018   Procedure: CYSTOSCOPY WITH Bladder BIOPSY;  Surgeon: Hollice Espy, MD;  Location: [facility];  Service: Urology;  Laterality: N/A;  . CYSTOSCOPY WITH FULGERATION N/A 03/20/2018   Procedure: CYSTOSCOPY WITH FULGERATION;  Surgeon: Hollice Espy, MD;  Location: [facility];  Service: Urology;  Laterality: N/A;  . FRACTURE SURGERY     fractured finger  . HEEL SPUR SURGERY  Right   . HERNIA REPAIR     age 22 inguinal   . KNEE ARTHROSCOPY Left   . LAPAROSCOPIC GASTRIC BANDING    . SHOULDER ARTHROSCOPY WITH DEBRIDEMENT AND BICEP TENDON REPAIR Right 08/12/2015   Procedure: SHOULDER ARTHROSCOPY WITH DEBRIDMENT, DECOMPRESSION, SLAP REPAIR, MINI OPEN BICEPS TENDONESIS;  Surgeon: Corky Mull, MD;  Location: [facility];  Service: Orthopedics;  Laterality: Right;  . TONSILLECTOMY      Social History: Social History   Socioeconomic History  . Marital status: Married    Spouse name: Not on file  . Number of children: Not on file  . Years of education: Not on file  . Highest education level: Not on file  Occupational History  . Not on file  Tobacco Use  . Smoking status: Never Smoker  . Smokeless tobacco: Never Used  Vaping Use  . Vaping Use: Never used  Substance and Sexual Activity  . Alcohol use: Yes    Comment: occ wine  . Drug use: No  . Sexual activity: Not on file  Other Topics Concern  . Not on file  Social History Narrative  . Not on file   Social Determinants of Health   Financial Resource Strain: Not on file  Food Insecurity: Not on file  Transportation Needs: Not on file  Physical Activity: Not on file  Stress: Not on file  Social Connections: Not on file  Intimate Partner Violence: Not on file    Family History: Family History  Problem Relation Age of Onset  . Breast cancer Neg Hx   . Bladder Cancer Neg Hx   . Kidney cancer Neg Hx     Review of Systems: Review of Systems  Constitutional: Negative.   Respiratory: Negative.   Cardiovascular: Negative.   Neurological: Negative.     Physical Exam: Vital Signs BP 116/75 (BP Location: Left Arm, Patient Position: Sitting, Cuff Size: Normal)   Pulse 99   Ht 5\' 3"  (1.6 m)   Wt 170 lb 12.8 oz (77.5 kg)   SpO2 95%   BMI 30.26 kg/m   Physical Exam Constitutional:      General: Not in acute distress.    Appearance: Normal appearance. Not ill-appearing.  HENT:     Head:  Normocephalic and atraumatic.  Eyes:     Pupils: Pupils are equal, round Neck:     Musculoskeletal: Normal range of motion.  Cardiovascular:     Rate and Rhythm: Normal rate    Pulses: Normal pulses.  Pulmonary:     Effort: Pulmonary effort is normal. No respiratory distress.  Abdominal:     General: Abdomen is flat. There is no distension.  Abdominal pannus is noted with mild rash noted between  folds.  Lap-Band port noted over left abdomen Musculoskeletal: Normal range of motion.  Skin:    General: Skin is warm and dry.     Findings: No erythema or rash.  Neurological:     General: No focal deficit present.     Mental Status: Alert and oriented to person, place, and time. Mental status is at baseline.     Motor: No weakness.  Psychiatric:        Mood and Affect: Mood normal.        Behavior: Behavior normal.    Assessment/Plan: The patient is scheduled for panniculectomy with possible upper abdominoplasty add-on with Dr. Marla Roe.  Risks, benefits, and alternatives of procedure discussed, questions answered and consent obtained.    Smoking Status: Non-smoker; Counseling Given?  N/A  Caprini Score: 4, moderate; Risk Factors include: Age, BMI greater than 25, and length of planned surgery. Recommendation for mechanical and pharmacological prophylaxis while hospitalized. Encourage early ambulation.   Pictures obtained: 04/12/2021 and at consult  Post-op Rx sent to pharmacy: Postop medications will be sent prior to surgery  We will obtain PCP and pulmonology clearances.  Patient was provided with the General Surgical Risk consent document and Pain Medication Agreement prior to their appointment.  They had adequate time to read through the risk consent documents and Pain Medication Agreement. We also discussed them in person together during this preop appointment. All of their questions were answered to their satisfaction.  Recommended calling if they have any further questions.   Risk consent form and Pain Medication Agreement to be scanned into patient's chart.  The risk that can be encountered for this procedure were discussed and include the following but not limited to these: asymmetry, fluid accumulation, firmness of the tissue, skin loss, decrease or no sensation, fat necrosis, bleeding, infection, healing delay.  Deep vein thrombosis, cardiac and pulmonary complications are risks to any procedure.  There are risks of anesthesia, changes to skin sensation and injury to nerves or blood vessels.  The muscle can be temporarily or permanently injured.  You may have an allergic reaction to tape, suture, glue, blood products which can result in skin discoloration, swelling, pain, skin lesions, poor healing.  Any of these can lead to the need for revisonal surgery or stage procedures.  Weight gain and weigh loss can also effect the long term appearance. The results are not guaranteed to last a lifetime.  Future surgery may be required.    Patient is understanding of the differences between an abdominoplasty and a panniculectomy.  I discussed with the patient that with panniculectomy the umbilicus is not moved and the upper abdomen will not be changed or altered.  We discussed that if she were to go the route of an abdominoplasty that would involve transposing or going above the umbilicus to remove excess tissue and then creating a new opening for her bellybutton.  Patient was understanding of this and stated that she was understanding of this.   We will provide her with a quote for abdominoplasty.  Electronically signed by: Carola Rhine Riordan Walle, PA-C 04/12/2021 1:45 PM

## 2021-04-15 DIAGNOSIS — Z719 Counseling, unspecified: Secondary | ICD-10-CM

## 2021-04-19 ENCOUNTER — Encounter: Payer: Self-pay | Admitting: Surgical

## 2021-04-19 ENCOUNTER — Encounter: Payer: 59 | Admitting: Surgical

## 2021-04-19 NOTE — Progress Notes (Signed)
Surgical Clearance has been received from Dr. Emily Filbert for patient's upcoming surgery abdominoplasty with liposuction with Dr. Marla Roe.  Patient cleared for surgery.

## 2021-04-25 ENCOUNTER — Other Ambulatory Visit (HOSPITAL_COMMUNITY): Payer: 59

## 2021-04-27 ENCOUNTER — Telehealth: Payer: Self-pay

## 2021-04-27 DIAGNOSIS — Z719 Counseling, unspecified: Secondary | ICD-10-CM

## 2021-04-27 MED ORDER — HYDROCODONE-ACETAMINOPHEN 5-325 MG PO TABS
1.0000 | ORAL_TABLET | Freq: Four times a day (QID) | ORAL | 0 refills | Status: AC | PRN
Start: 2021-04-27 — End: 2021-05-02

## 2021-04-27 MED ORDER — CEPHALEXIN 500 MG PO CAPS: 500.0000 mg | ORAL_CAPSULE | Freq: Four times a day (QID) | ORAL | 0 refills | Status: AC

## 2021-04-27 MED ORDER — ONDANSETRON HCL 4 MG PO TABS: 4.0000 mg | ORAL_TABLET | Freq: Three times a day (TID) | ORAL | 0 refills | Status: DC | PRN

## 2021-04-27 NOTE — Telephone Encounter (Signed)
Post op meds 

## 2021-04-27 NOTE — Telephone Encounter (Signed)
Patient called to ask when we will be sending her prescriptions to the pharmacy.  Please call.  **Patient said her preferred pharmacy is the [facility] on [address] in [place].

## 2021-04-28 ENCOUNTER — Encounter (HOSPITAL_BASED_OUTPATIENT_CLINIC_OR_DEPARTMENT_OTHER): Payer: Self-pay

## 2021-04-28 ENCOUNTER — Ambulatory Visit (HOSPITAL_BASED_OUTPATIENT_CLINIC_OR_DEPARTMENT_OTHER): Admit: 2021-04-28 | Payer: 59 | Admitting: Plastic Surgery

## 2021-04-28 SURGERY — PANNICULECTOMY
Anesthesia: General | Site: Abdomen

## 2021-05-06 ENCOUNTER — Telehealth: Payer: Self-pay

## 2021-05-06 ENCOUNTER — Encounter: Payer: 59 | Admitting: Plastic Surgery

## 2021-05-06 NOTE — Telephone Encounter (Signed)
Patient called to say that she had surgery with Dr. Marla Roe on Monday.  Patient would like to know if she can take her binder off long enough to wash under there.  She said it's starting to itch.  Please call.

## 2021-05-06 NOTE — Telephone Encounter (Signed)
Called and spoke with the patient and informed her that I spoke with [facility] and he stated yes she can go ahead and take the binder off and wash.   Patient verbalized understanding and agreed.//AB/CMA

## 2021-05-10 ENCOUNTER — Encounter: Payer: Self-pay | Admitting: Plastic Surgery

## 2021-05-10 ENCOUNTER — Ambulatory Visit (INDEPENDENT_AMBULATORY_CARE_PROVIDER_SITE_OTHER): Payer: 59 | Admitting: Plastic Surgery

## 2021-05-10 ENCOUNTER — Other Ambulatory Visit: Payer: Self-pay

## 2021-05-10 DIAGNOSIS — M793 Panniculitis, unspecified: Secondary | ICD-10-CM

## 2021-05-10 NOTE — Progress Notes (Signed)
The patient is a 60 year old female here for follow-up on her panniculectomy.  Overall she is doing well and off the pain meds.  Her bowels are moving regularly.  She has a lot of swelling and bruising as expected.  She has the right drain in.  Is not ready to come out but should be by the end of the week.  She can give [place] a call if the output has been low and come in Friday.  I recommend she go into a spanks.  I think this will be more comfortable than the abdominal binder.  She had a fair bit of irritation of the skin and some urticaria so I removed the bandages.  She can shower and just protect the drain.

## 2021-05-13 ENCOUNTER — Telehealth: Payer: Self-pay

## 2021-05-13 NOTE — Telephone Encounter (Signed)
Called the patient back and informed her that I informed Dr. Marla Roe of the drainage output and she stated that she should be fine.  Informed the patient that we will see her next week.  Also informed the patient if she does start to have any issues please give [place] a call back.  We do have someone on call.   Patient verbalized understanding and agreed.//AB/CMA

## 2021-05-13 NOTE — Telephone Encounter (Signed)
Called and spoke with the patient regarding the message below.  Informed her that I spoke with Dr. Marla Roe and she stated that it sounds good what she's doing.  She can tape it to her leg.  Asked the patient how much drainage is coming out?  She stated yesterday morning the output was 85cc, and this morning the output was 95cc.    She stated that the drainage was not real bloody, but very light in color.

## 2021-05-13 NOTE — Telephone Encounter (Signed)
Patient called to let us know that she sent a MyChart message to Dr. Marla Roe.  She said that she was having trouble creating a new message, so she replied to a message from April, 2022.  Patient wanted to let us know that since she started using the spanks, her drainage has increased and the suture holding the tube in place broke, so she taped it.  Please call.

## 2021-05-16 ENCOUNTER — Other Ambulatory Visit (HOSPITAL_COMMUNITY): Payer: 59

## 2021-05-16 NOTE — Progress Notes (Signed)
Patient is a 60 year old female here for follow-up after her panniculectomy with Dr. Marla Roe on 05/02/2021.  She is 2 weeks postop.  She reports overall she is doing really well.  She reports that she is still having a good amount of drainage from the JP drain.  Greater than 40 cc per 24 hours.  She is not having any infectious symptoms.  The JP drain is irritating.  Chaperone present on exam On exam abdominal incision is intact.  JP drain in place with serosanguineous drainage in bulb.  There is no cellulitic changes noted but she does have some erythema surrounding the JP drain insertion site.  She also has a little bit of erythema surrounding the incisions but this does not appear infected.  There is no purulence noted.  No wounds are noted.  Recommend continue to wear compressive garment 24/7.  Avoid heavy lifting.  Avoid strenuous activity.  We will plan to leave the JP drain in place for at least a few more days up to 1 more week.  I discussed with the patient that if the drainage becomes less than 35 to 40 cc in a 24-hour period for 2 days then we could remove it.

## 2021-05-17 ENCOUNTER — Other Ambulatory Visit: Payer: Self-pay

## 2021-05-17 ENCOUNTER — Ambulatory Visit (INDEPENDENT_AMBULATORY_CARE_PROVIDER_SITE_OTHER): Payer: Self-pay | Admitting: Surgical

## 2021-05-17 ENCOUNTER — Encounter: Payer: Self-pay | Admitting: Surgical

## 2021-05-17 DIAGNOSIS — M793 Panniculitis, unspecified: Secondary | ICD-10-CM

## 2021-05-17 DIAGNOSIS — M546 Pain in thoracic spine: Secondary | ICD-10-CM

## 2021-05-17 DIAGNOSIS — G8929 Other chronic pain: Secondary | ICD-10-CM

## 2021-05-17 MED ORDER — DOXYCYCLINE HYCLATE 100 MG PO TABS: 100.0000 mg | ORAL_TABLET | Freq: Two times a day (BID) | ORAL | 0 refills | Status: AC

## 2021-05-23 ENCOUNTER — Other Ambulatory Visit: Payer: Self-pay

## 2021-05-23 ENCOUNTER — Ambulatory Visit (INDEPENDENT_AMBULATORY_CARE_PROVIDER_SITE_OTHER): Payer: Self-pay

## 2021-05-23 VITALS — BP 123/75 | Ht 63.0 in | Wt 170.0 lb

## 2021-05-23 DIAGNOSIS — M793 Panniculitis, unspecified: Secondary | ICD-10-CM

## 2021-05-24 ENCOUNTER — Ambulatory Visit: Payer: 59

## 2021-05-27 ENCOUNTER — Encounter: Payer: 59 | Admitting: Surgical

## 2021-05-31 ENCOUNTER — Ambulatory Visit: Payer: 59 | Admitting: Surgical

## 2021-05-31 ENCOUNTER — Telehealth: Payer: Self-pay | Admitting: Plastic Surgery

## 2021-05-31 NOTE — Telephone Encounter (Signed)
Returned patients call. Her output from her JP drain is still consistently minimum of 40 ccs on a daily basis. Yesterday she went hiking with her husband and went shopping. Advised to take it more easy with no strenuous activities to prevent any injuries/problems to incision/tissue from the panniculectomy surgery that was performed on 05/02/21 with Dr. Marla Roe. The next follow up appointment is 06/09/21. Should the output go down to 30cc for at least 3 days consecutive days, she may call the office and we will remove the JP drain. Patient understood and agreed with plan.

## 2021-05-31 NOTE — Telephone Encounter (Signed)
Per patient in regards to drain- she currently has one "bulb" that fills with 40cc. She is asking that if she gets two 'bulbs' and each of them have 20cc, can she get the drain removed? Please call to advise [phone]. Thanks.

## 2021-06-07 ENCOUNTER — Encounter: Payer: 59 | Admitting: Plastic Surgery

## 2021-06-09 ENCOUNTER — Other Ambulatory Visit: Payer: Self-pay

## 2021-06-09 ENCOUNTER — Ambulatory Visit (INDEPENDENT_AMBULATORY_CARE_PROVIDER_SITE_OTHER): Payer: 59

## 2021-06-09 VITALS — BP 120/74 | HR 77 | Temp 97.5°F | Ht 63.0 in | Wt 170.0 lb

## 2021-06-09 DIAGNOSIS — M793 Panniculitis, unspecified: Secondary | ICD-10-CM | POA: Diagnosis not present

## 2021-06-22 NOTE — Progress Notes (Signed)
05/23/21 Patient in office today for f/u- postop evaluation S/P panniculectomy by Dr. Marla Roe on 05/02/21 She states she is improving more every day & the pain has decreased significantly- she is c/o right drain site which is visibly red and irritated but no purulent drainage or other signs of infection. She reports that the drainage = 45 to 50 ml/24 hrs for the last 3 days. She has recently finished a course of Doxycycline.  She denies any fever/chills or other complications. The incisions are intact & healing is progressing I informed her that per [facility] & Dr. Eusebio Friendly orders- I will not be able to remove the drain today. she understands the plan & we will see her back for possible drain removal at her next appointment. She is reminded to continue compression garment & monitor drain output & call for any concerns.

## 2021-06-23 NOTE — Patient Instructions (Signed)
Patient will continue compression garment & monitor drainage/symptoms. Call for any concerns.

## 2021-06-23 NOTE — Patient Instructions (Signed)
Pt will use vaseline/gauze as needed for right side drain. Call for any concerns

## 2021-06-23 NOTE — Progress Notes (Signed)
06/09/21 Patient in office today for f/u-postop evaluation & possible drain removal On exam incision is intact- no marked redness/swelling or other sign of complication. Right drain site is slightly red at insertion site- but denies any chills/fever or foul odor. Drainage has been documented 25 ml for past 3 consecutive days & there is no visible sign of seroma or fluid accumulation noted. She does still have slight swelling - but no noted bruising or redness. I am unable to express any further drainage- so I removed the drain & applied vaseline/gauze & instructed her to do the same until the site is healed. She reports her pain has decreased significantly & she has increased her activities without complications.  She continues to restrict any heavy lifting/pulling - she is walking for exercise. She is eating/drinking- normal bowel/bladder function. She does continue to use compression garment & will do so until her next evaluation with Dr. Marla Roe in August. She did inquire about when she can swim or submerge in water- & Iinstructed her to not submerge/swim until drain site is completely healed. She will refrain until she sees Dr. Marla Roe She is reminded to call for any concerns or changes

## 2021-07-12 ENCOUNTER — Other Ambulatory Visit: Payer: Self-pay

## 2021-07-12 ENCOUNTER — Encounter: Payer: Self-pay | Admitting: Plastic Surgery

## 2021-07-12 ENCOUNTER — Ambulatory Visit (INDEPENDENT_AMBULATORY_CARE_PROVIDER_SITE_OTHER): Payer: 59 | Admitting: Plastic Surgery

## 2021-07-12 DIAGNOSIS — M793 Panniculitis, unspecified: Secondary | ICD-10-CM

## 2021-07-12 NOTE — Progress Notes (Signed)
   Subjective:    Patient ID: Lynn Dennis, female    DOB: 1961/08/23, 60 y.o.   MRN: [id number]  The patient is a 60 year old female here for follow-up after undergoing a panniculectomy.  She is doing very well.  The swelling and drainage has markedly improved.  There is no sign of a hematoma or seroma.  The incision is healing well.  There is a small rash on the left superior part of the incision site.  The incision looks okay.  Looks like some skin irritation.     Review of Systems  Constitutional: Negative.   Eyes: Negative.   Respiratory: Negative.    Genitourinary: Negative.   Hematological: Negative.       Objective:   Physical Exam Constitutional:      Appearance: Normal appearance.  Cardiovascular:     Rate and Rhythm: Normal rate.     Pulses: Normal pulses.  Pulmonary:     Effort: Pulmonary effort is normal.  Abdominal:     General: Abdomen is flat. There is no distension.     Palpations: There is no mass.     Tenderness: There is no rebound.  Neurological:     Mental Status: She is alert. Mental status is at baseline.  Psychiatric:        Mood and Affect: Mood normal.        Thought Content: Thought content normal.       Assessment & Plan:     ICD-10-CM   1. Panniculitis  M79.3         Recommend hydrocortisone to the rash.  Continue to massage the incision.  I am happy for her she has done so well.  I would like to see her back as needed.  Pictures were obtained of the patient and placed in the chart with the patient's or guardian's permission.

## 2021-10-12 ENCOUNTER — Ambulatory Visit: Payer: 59 | Admitting: Urology

## 2021-10-24 ENCOUNTER — Other Ambulatory Visit: Payer: Self-pay

## 2021-10-24 MED ORDER — OXYBUTYNIN CHLORIDE 5 MG PO TABS: 5.0000 mg | ORAL_TABLET | Freq: Three times a day (TID) | ORAL | 0 refills | Status: DC

## 2021-10-24 NOTE — Telephone Encounter (Signed)
Patient called leaving a voicemail on the triage line stating that she is having bad episodes of bladder spasms again and would like a refill on her oxybutinin to see if this helps her symptoms. Refill was sent in per Dr. Cherrie Gauze last note.

## 2021-11-15 ENCOUNTER — Other Ambulatory Visit: Payer: Self-pay | Admitting: Urology

## 2022-02-28 ENCOUNTER — Telehealth: Payer: Self-pay | Admitting: Family Medicine

## 2022-02-28 NOTE — Telephone Encounter (Signed)
I think it would be most appropriate to offer her an appointment.  She has not been seen in nearly a year and this is not something that I prescribed for her in the past. ? ?She can see myself or Shannon. ? ?Hollice Espy, MD ? ?

## 2022-02-28 NOTE — Telephone Encounter (Signed)
Patient left message on triage line requesting vaginal Diazepam suppositories. She states riding a car is really bothering her interstitial cystitis. ?

## 2022-03-01 NOTE — Telephone Encounter (Signed)
Patient informed, requests appointment to follow up with Dr. Erlene Quan.  scheduled appointment. Voiced understanding.  ?

## 2022-03-21 NOTE — H&P (View-Only) (Signed)
? ?03/22/22 ?7:57 PM  ? ?Lynn Dennis ?[birth year] ?[id number] ? ?Referring provider:  ?Rusty Aus, MD ?[place] [facility] ?[place],  [place] [address] ?Chief Complaint  ?Patient presents with  ? Other  ? ? ? ?HPI: ?Lynn Dennis is a 61 y.o.female with a personal history of IC who presents today for further evaluation of IC flare.  ? ?She underwent cystoscopy, hydrodistention with biopsies in 03/2018.  Following this, she was actually able to get her symptoms under control with dietary change, tracking her food on an app and taking oxybutynin as needed. ? ?She is managed on oxybutynin and pyridium as needed.  ? ?She reports that she has continued focused on dietary changed including avoiding acidic foods and drinks.  ? ?She recently went on a road trip and noticed her IC pain worsened which she believes was exacerbated by prolonged sitting. She has been using oxybutynin as needed whenever she has bladder spams.  ? ?PMH: ?Past Medical History:  ?Diagnosis Date  ? Arthritis   ? neck  ? Cancer [facility])   ? squamous cell skin cancer  ? GERD (gastroesophageal reflux disease)   ? only time is when lap band is loosened  ? History of hiatal hernia   ? fixed with lap band surgery  ? Pneumonia 2014  ? Sleep apnea   ? uses cpap  ? ? ?Surgical History: ?Past Surgical History:  ?Procedure Laterality Date  ? ABDOMINAL HYSTERECTOMY    ? CYSTO WITH HYDRODISTENSION N/A 03/20/2018  ? Procedure: CYSTOSCOPY/HYDRODISTENSION;  Surgeon: Hollice Espy, MD;  Location: [facility] ORS;  Service: Urology;  Laterality: N/A;  ? CYSTOSCOPY WITH BIOPSY N/A 03/20/2018  ? Procedure: CYSTOSCOPY WITH Bladder BIOPSY;  Surgeon: Hollice Espy, MD;  Location: [facility] ORS;  Service: Urology;  Laterality: N/A;  ? [place] N/A 03/20/2018  ? Procedure: CYSTOSCOPY WITH FULGERATION;  Surgeon: Hollice Espy, MD;  Location: [facility] ORS;  Service: Urology;  Laterality: N/A;  ? FRACTURE SURGERY    ? fractured finger  ?  HEEL SPUR SURGERY Right   ? HERNIA REPAIR    ? age 19 inguinal   ? KNEE ARTHROSCOPY Left   ? LAPAROSCOPIC GASTRIC BANDING    ? SHOULDER ARTHROSCOPY WITH DEBRIDEMENT AND BICEP TENDON REPAIR Right 08/12/2015  ? Procedure: SHOULDER ARTHROSCOPY WITH DEBRIDMENT, DECOMPRESSION, SLAP REPAIR, MINI OPEN BICEPS TENDONESIS;  Surgeon: Corky Mull, MD;  Location: [facility] ORS;  Service: Orthopedics;  Laterality: Right;  ? TONSILLECTOMY    ? ? ?Home Medications:  ?Allergies as of 03/22/2022   ? ?   Reactions  ? Prednisone Other (See Comments)  ? hallucinantions  ? Adhesive [tape] Other (See Comments)  ? Skin discomfort with extended use  ? Gluten Meal   ? Gluten intolerant  ? Lactose Intolerance (gi) Other (See Comments)  ? Gi upset  ? Nsaids Other (See Comments)  ? Patient has lap band  ? ?  ? ?  ?Medication List  ?  ? ?  ? Accurate as of March 22, 2022 11:59 PM. If you have any questions, ask your nurse or doctor.  ?  ?  ? ?  ? ?STOP taking these medications   ? ?estradiol 1 MG tablet ?Commonly known as: ESTRACE ?Stopped by: Hollice Espy, MD ?  ?ondansetron 4 MG tablet ?Commonly known as: Zofran ?Stopped by: Hollice Espy, MD ?  ?Phenazopyridine HCl 97.5 MG Tabs ?Stopped by: Hollice Espy, MD ?  ? ?  ? ?TAKE these  medications   ? ?acetaminophen 500 MG tablet ?Commonly known as: TYLENOL ?Take 1,000 mg by mouth every 6 (six) hours as needed for moderate pain or headache. ?  ?carboxymethylcellulose 0.5 % Soln ?Commonly known as: REFRESH PLUS ?Place 1 drop into both eyes at bedtime. ?  ?diazepam 10 MG tablet ?Commonly known as: Valium ?Take 1 tablet (10 mg total) by mouth once for 1 dose. Take as needed for long car rides per vagina.  Do not take orally. ?Started by: Hollice Espy, MD ?  ?escitalopram 5 MG tablet ?Commonly known as: LEXAPRO ?Take 5 mg by mouth daily. ?What changed: Another medication with the same name was removed. Continue taking this medication, and follow the directions you see here. ?Changed by: Hollice Espy,  MD ?  ?FISH OIL PO ?Take 1 tablet by mouth daily. ?  ?hydrOXYzine 10 MG tablet ?Commonly known as: ATARAX ?Take by mouth. ?  ?metroNIDAZOLE 1 % gel ?Commonly known as: METROGEL ?Apply topically. ?  ?Multi-Vitamins Tabs ?Take 1 tablet by mouth daily. ?  ?oxybutynin 5 MG tablet ?Commonly known as: DITROPAN ?Take by mouth. ?What changed: Another medication with the same name was removed. Continue taking this medication, and follow the directions you see here. ?Changed by: Hollice Espy, MD ?  ?triamcinolone cream 0.1 % ?Commonly known as: KENALOG ?Apply 1 application topically daily as needed (rash). ?  ?Yuvafem 10 MCG Tabs vaginal tablet ?Generic drug: Estradiol ?  ? ?  ? ? ?Allergies:  ?Allergies  ?Allergen Reactions  ? Prednisone Other (See Comments)  ?  hallucinantions  ? Adhesive [Tape] Other (See Comments)  ?  Skin discomfort with extended use  ? Gluten Meal   ?  Gluten intolerant  ? Lactose Intolerance (Gi) Other (See Comments)  ?  Gi upset  ? Nsaids Other (See Comments)  ?  Patient has lap band  ? ? ?Family History: ?Family History  ?Problem Relation Age of Onset  ? Breast cancer Neg Hx   ? Bladder Cancer Neg Hx   ? Kidney cancer Neg Hx   ? ? ?Social History:  reports that she has never smoked. She has never used smokeless tobacco. She reports current alcohol use. She reports that she does not use drugs. ? ? ?Physical Exam: ?BP 119/72   Pulse 74   Ht '5\' 3"'$  (1.6 m)   Wt 170 lb (77.1 kg)   BMI 30.11 kg/m?   ?Constitutional:  Alert and oriented, No acute distress. ?HEENT: [place] AT, moist mucus membranes.  Trachea midline, no masses. ?Cardiovascular: No clubbing, cyanosis, or edema. ?Respiratory: Normal respiratory effort, no increased work of breathing. ?Skin: No rashes, bruises or suspicious lesions. ?Neurologic: Grossly intact, no focal deficits, moving all 4 extremities. ?Psychiatric: Normal mood and affect. ? ?Laboratory Data: ? ?Lab Results  ?Component Value Date  ? CREATININE 0.66 08/09/2015   ? ? ?Urinalysis ?11-30 RBCs  ? ?Assessment & Plan:   ?Microscopic hematuria  ?- We discussed the differential diagnosis for microscopic hematuria including nephrolithiasis, renal or upper tract tumors, bladder stones, UTIs, or bladder tumors as well as undetermined etiologies. Per AUA guidelines, I did recommend complete microscopic hematuria evaluation including CTU, possible urine cytology, and OR cystoscopy. ?- Urine sent for pre-op culture  ? ?2. IC (interstitial cystitis) ?- Personal history of IC currently in reasonable control primarily with dietary intervention ?- Continue oxybutynin as needed  ?- She is interested in vaginal valium suppository. Prescribed today.  Warned about sedation; no driving when using this medication. ?-Given  previous improvement with hydro distention as well as need for cysto, will do this in OR for the purpose of treatment and evaluation of both ? ? ?Conley Rolls as a scribe for Hollice Espy, MD.,have documented all relevant documentation on the behalf of Hollice Espy, MD,as directed by  Hollice Espy, MD while in the presence of Hollice Espy, MD. ? ?I have reviewed the above documentation for accuracy and completeness, and I agree with the above.  ? ?Hollice Espy, MD ? ? ?[facility] ?[address] ?[place], [place] [address] ?[phone] ? ?

## 2022-03-21 NOTE — Progress Notes (Addendum)
? ?03/22/22 ?7:57 PM  ? ?Lynn Dennis ?05/06/1961 ?[id number] ? ?Referring provider:  ?Rusty Aus, MD ?[place] ?[facility] ?[place],   [id number] ?Chief Complaint  ?Patient presents with  ? Other  ? ? ? ?HPI: ?Lynn Dennis is a 61 y.o.female with a personal history of IC who presents today for further evaluation of IC flare.  ? ?She underwent cystoscopy, hydrodistention with biopsies in 03/2018.  Following this, she was actually able to get her symptoms under control with dietary change, tracking her food on an app and taking oxybutynin as needed. ? ?She is managed on oxybutynin and pyridium as needed.  ? ?She reports that she has continued focused on dietary changed including avoiding acidic foods and drinks.  ? ?She recently went on a road trip and noticed her IC pain worsened which she believes was exacerbated by prolonged sitting. She has been using oxybutynin as needed whenever she has bladder spams.  ? ?PMH: ?Past Medical History:  ?Diagnosis Date  ? Arthritis   ? neck  ? Cancer [facility])   ? squamous cell skin cancer  ? GERD (gastroesophageal reflux disease)   ? only time is when lap band is loosened  ? History of hiatal hernia   ? fixed with lap band surgery  ? Pneumonia 2014  ? Sleep apnea   ? uses cpap  ? ? ?Surgical History: ?Past Surgical History:  ?Procedure Laterality Date  ? ABDOMINAL HYSTERECTOMY    ? CYSTO WITH HYDRODISTENSION N/A 03/20/2018  ? Procedure: CYSTOSCOPY/HYDRODISTENSION;  Surgeon: Hollice Espy, MD;  Location: [facility] ORS;  Service: Urology;  Laterality: N/A;  ? CYSTOSCOPY WITH BIOPSY N/A 03/20/2018  ? Procedure: CYSTOSCOPY WITH Bladder BIOPSY;  Surgeon: Hollice Espy, MD;  Location: [facility] ORS;  Service: Urology;  Laterality: N/A;  ? [place] N/A 03/20/2018  ? Procedure: CYSTOSCOPY WITH FULGERATION;  Surgeon: Hollice Espy, MD;  Location: [facility] ORS;  Service: Urology;  Laterality: N/A;  ? FRACTURE SURGERY    ? fractured finger  ?  HEEL SPUR SURGERY Right   ? HERNIA REPAIR    ? age 75 inguinal   ? KNEE ARTHROSCOPY Left   ? LAPAROSCOPIC GASTRIC BANDING    ? SHOULDER ARTHROSCOPY WITH DEBRIDEMENT AND BICEP TENDON REPAIR Right 08/12/2015  ? Procedure: SHOULDER ARTHROSCOPY WITH DEBRIDMENT, DECOMPRESSION, SLAP REPAIR, MINI OPEN BICEPS TENDONESIS;  Surgeon: Corky Mull, MD;  Location: [facility] ORS;  Service: Orthopedics;  Laterality: Right;  ? TONSILLECTOMY    ? ? ?Home Medications:  ?Allergies as of 03/22/2022   ? ?   Reactions  ? Prednisone Other (See Comments)  ? hallucinantions  ? Adhesive [tape] Other (See Comments)  ? Skin discomfort with extended use  ? Gluten Meal   ? Gluten intolerant  ? Lactose Intolerance (gi) Other (See Comments)  ? Gi upset  ? Nsaids Other (See Comments)  ? Patient has lap band  ? ?  ? ?  ?Medication List  ?  ? ?  ? Accurate as of March 22, 2022 11:59 PM. If you have any questions, ask your nurse or doctor.  ?  ?  ? ?  ? ?STOP taking these medications   ? ?estradiol 1 MG tablet ?Commonly known as: ESTRACE ?Stopped by: Hollice Espy, MD ?  ?ondansetron 4 MG tablet ?Commonly known as: Zofran ?Stopped by: Hollice Espy, MD ?  ?Phenazopyridine HCl 97.5 MG Tabs ?Stopped by: Hollice Espy, MD ?  ? ?  ? ?TAKE these  medications   ? ?acetaminophen 500 MG tablet ?Commonly known as: TYLENOL ?Take 1,000 mg by mouth every 6 (six) hours as needed for moderate pain or headache. ?  ?carboxymethylcellulose 0.5 % Soln ?Commonly known as: REFRESH PLUS ?Place 1 drop into both eyes at bedtime. ?  ?diazepam 10 MG tablet ?Commonly known as: Valium ?Take 1 tablet (10 mg total) by mouth once for 1 dose. Take as needed for long car rides per vagina.  Do not take orally. ?Started by: Hollice Espy, MD ?  ?escitalopram 5 MG tablet ?Commonly known as: LEXAPRO ?Take 5 mg by mouth daily. ?What changed: Another medication with the same name was removed. Continue taking this medication, and follow the directions you see here. ?Changed by: Hollice Espy,  MD ?  ?FISH OIL PO ?Take 1 tablet by mouth daily. ?  ?hydrOXYzine 10 MG tablet ?Commonly known as: ATARAX ?Take by mouth. ?  ?metroNIDAZOLE 1 % gel ?Commonly known as: METROGEL ?Apply topically. ?  ?Multi-Vitamins Tabs ?Take 1 tablet by mouth daily. ?  ?oxybutynin 5 MG tablet ?Commonly known as: DITROPAN ?Take by mouth. ?What changed: Another medication with the same name was removed. Continue taking this medication, and follow the directions you see here. ?Changed by: Hollice Espy, MD ?  ?triamcinolone cream 0.1 % ?Commonly known as: KENALOG ?Apply 1 application topically daily as needed (rash). ?  ?Yuvafem 10 MCG Tabs vaginal tablet ?Generic drug: Estradiol ?  ? ?  ? ? ?Allergies:  ?Allergies  ?Allergen Reactions  ? Prednisone Other (See Comments)  ?  hallucinantions  ? Adhesive [Tape] Other (See Comments)  ?  Skin discomfort with extended use  ? Gluten Meal   ?  Gluten intolerant  ? Lactose Intolerance (Gi) Other (See Comments)  ?  Gi upset  ? Nsaids Other (See Comments)  ?  Patient has lap band  ? ? ?Family History: ?Family History  ?Problem Relation Age of Onset  ? Breast cancer Neg Hx   ? Bladder Cancer Neg Hx   ? Kidney cancer Neg Hx   ? ? ?Social History:  reports that she has never smoked. She has never used smokeless tobacco. She reports current alcohol use. She reports that she does not use drugs. ? ? ?Physical Exam: ?BP 119/72   Pulse 74   Ht '5\' 3"'$  (1.6 m)   Wt 170 lb (77.1 kg)   BMI 30.11 kg/m?   ?Constitutional:  Alert and oriented, No acute distress. ?HEENT: [place] AT, moist mucus membranes.  Trachea midline, no masses. ?Cardiovascular: No clubbing, cyanosis, or edema. ?Respiratory: Normal respiratory effort, no increased work of breathing. ?Skin: No rashes, bruises or suspicious lesions. ?Neurologic: Grossly intact, no focal deficits, moving all 4 extremities. ?Psychiatric: Normal mood and affect. ? ?Laboratory Data: ? ?Lab Results  ?Component Value Date  ? CREATININE 0.66 08/09/2015   ? ? ?Urinalysis ?11-30 RBCs  ? ?Assessment & Plan:   ?Microscopic hematuria  ?- We discussed the differential diagnosis for microscopic hematuria including nephrolithiasis, renal or upper tract tumors, bladder stones, UTIs, or bladder tumors as well as undetermined etiologies. Per AUA guidelines, I did recommend complete microscopic hematuria evaluation including CTU, possible urine cytology, and OR cystoscopy. ?- Urine sent for pre-op culture  ? ?2. IC (interstitial cystitis) ?- Personal history of IC currently in reasonable control primarily with dietary intervention ?- Continue oxybutynin as needed  ?- She is interested in vaginal valium suppository. Prescribed today.  Warned about sedation; no driving when using this medication. ?-Given  previous improvement with hydro distention as well as need for cysto, will do this in OR for the purpose of treatment and evaluation of both ? ? ?Conley Rolls as a scribe for Hollice Espy, MD.,have documented all relevant documentation on the behalf of Hollice Espy, MD,as directed by  Hollice Espy, MD while in the presence of Hollice Espy, MD. ? ?I have reviewed the above documentation for accuracy and completeness, and I agree with the above.  ? ?Hollice Espy, MD ? ? ?[facility] ?[address], [address] ?[place], [place] [address] ?[phone] ? ?

## 2022-03-22 ENCOUNTER — Ambulatory Visit (INDEPENDENT_AMBULATORY_CARE_PROVIDER_SITE_OTHER): Payer: 59 | Admitting: Urology

## 2022-03-22 ENCOUNTER — Encounter: Payer: Self-pay | Admitting: Urology

## 2022-03-22 VITALS — BP 119/72 | HR 74 | Ht 63.0 in | Wt 170.0 lb

## 2022-03-22 DIAGNOSIS — R3129 Other microscopic hematuria: Secondary | ICD-10-CM | POA: Diagnosis not present

## 2022-03-22 DIAGNOSIS — N3010 Interstitial cystitis (chronic) without hematuria: Secondary | ICD-10-CM | POA: Diagnosis not present

## 2022-03-22 LAB — URINALYSIS, COMPLETE
Bilirubin, UA: NEGATIVE
Glucose, UA: NEGATIVE
Ketones, UA: NEGATIVE
Leukocytes,UA: NEGATIVE
Nitrite, UA: NEGATIVE
Protein,UA: NEGATIVE
Specific Gravity, UA: 1.02 (ref 1.005–1.030)
Urobilinogen, Ur: 0.2 mg/dL (ref 0.2–1.0)
pH, UA: 6 (ref 5.0–7.5)

## 2022-03-22 LAB — MICROSCOPIC EXAMINATION: Bacteria, UA: NONE SEEN

## 2022-03-22 MED ORDER — DIAZEPAM 10 MG PO TABS: 10.0000 mg | ORAL_TABLET | Freq: Once | ORAL | 0 refills | Status: AC

## 2022-03-24 ENCOUNTER — Other Ambulatory Visit (INDEPENDENT_AMBULATORY_CARE_PROVIDER_SITE_OTHER): Payer: 59 | Admitting: Urology

## 2022-03-24 DIAGNOSIS — R3129 Other microscopic hematuria: Secondary | ICD-10-CM

## 2022-03-24 DIAGNOSIS — N3010 Interstitial cystitis (chronic) without hematuria: Secondary | ICD-10-CM

## 2022-03-24 NOTE — Progress Notes (Signed)
Surgical Physician Order Form [facility] [place] ? ?* Scheduling expectation : Next Available ? ?*Length of Case:  ? ?*Clearance needed: no ? ?*Anticoagulation Instructions: N/A ? ?*Aspirin Instructions: Ok to continue Aspirin ? ?*Post-op visit Date/Instructions:   1 year annual f/u ? ?*Diagnosis: IC, microscopic hematuria ? ?Procedure: cysto/ hydrodistention ? ?Additional orders: N/A ? ?-Admit type: OUTpatient ? ?-Anesthesia: General ? ?-VTE Prophylaxis Standing Order SCD?s    ?   ?Other:  ? ?-Standing Lab Orders Per Anesthesia   ? ?Lab other: None ? ?-Standing Test orders EKG/Chest x-ray per Anesthesia      ? ?Test other:  ? ?- Medications:  Ancef 2gm IV ? ?-Other orders:  N/A ? ? ? ?  ? ?

## 2022-03-25 LAB — CULTURE, URINE COMPREHENSIVE

## 2022-03-28 ENCOUNTER — Telehealth: Payer: Self-pay

## 2022-03-28 NOTE — Progress Notes (Signed)
[facility] Posting Form  ? ?Surgery Date/Time: Date: 04/10/2022 ? ?Surgeon: Dr. Hollice Espy, MD ? ?Surgery Location: Day Surgery ? ?Inpt ( No  )   Outpt (Yes)   Obs ( No  )  ? ?Diagnosis: N30.10 Interstitial Cystitis; R31.29 Microscopic Hematuria ? ?-CPT: 75436 ? ?Surgery: Cystoscopy Hydrodistention ? ?Stop Anticoagulations: No, may continue ASA ? ?Cardiac/Medical/Pulmonary Clearance needed: no ? ?*Orders entered into EPIC  Date: 03/28/22  ? ?*Case booked in [place]  Date: 03/27/2022 ? ?*Notified pt of Surgery: Date: 03/27/2022 ? ?PRE-OP UA & CX: no ? ?*Placed into Prior Authorization Work Que Date: 03/28/22 ? ? ?Assistant/laser/rep:No ? ? ? ? ? ? ? ? ? ? ? ? ? ? ? ?

## 2022-03-28 NOTE — Telephone Encounter (Signed)
I spoke with Mrs. Lynn Dennis. We have discussed possible surgery dates and Monday May 1st, 2023 was agreed upon by all parties. Patient given information about surgery date, what to expect pre-operatively and post operatively.  ?We discussed that a Pre-Admission Testing office will be calling to set up the pre-op visit that will take place prior to surgery, and that these appointments are typically done over the phone with a Pre-Admissions RN. ? Informed patient that our office will communicate any additional care to be provided after surgery. Patients questions or concerns were discussed during our call. Advised to call our office should there be any additional information, questions or concerns that arise. Patient verbalized understanding.  ? ?

## 2022-03-30 ENCOUNTER — Encounter: Payer: Self-pay | Admitting: Urology

## 2022-03-30 NOTE — Telephone Encounter (Signed)
Spoke with patient, hematuria has improved. She did a lot of activity yesterday, washing the car and abdominal exercises. Stated the oxybutynin has been helping and symptoms have improved. Will call the office if symptoms worsen. ?

## 2022-03-31 ENCOUNTER — Encounter: Payer: Self-pay | Admitting: Urology

## 2022-03-31 ENCOUNTER — Ambulatory Visit
Admission: RE | Admit: 2022-03-31 | Discharge: 2022-03-31 | Disposition: A | Discharge status: Home / Self Care | Payer: 59 | Source: Ambulatory Visit | Attending: Urology | Admitting: Urology

## 2022-03-31 DIAGNOSIS — R3129 Other microscopic hematuria: Secondary | ICD-10-CM | POA: Diagnosis present

## 2022-03-31 DIAGNOSIS — N3010 Interstitial cystitis (chronic) without hematuria: Secondary | ICD-10-CM | POA: Diagnosis present

## 2022-03-31 LAB — POCT I-STAT CREATININE: Creatinine, Ser: 0.6 mg/dL (ref 0.44–1.00)

## 2022-03-31 MED ORDER — IOHEXOL 300 MG/ML  SOLN
100.0000 mL | Freq: Once | INTRAMUSCULAR | Status: AC | PRN
  Administered 2022-03-31: 100 mL via INTRAVENOUS

## 2022-04-04 ENCOUNTER — Encounter
Admission: RE | Admit: 2022-04-04 | Discharge: 2022-04-04 | Disposition: A | Discharge status: Home / Self Care | Payer: 59 | Source: Ambulatory Visit | Attending: Urology | Admitting: Urology

## 2022-04-04 ENCOUNTER — Telehealth: Payer: Self-pay | Admitting: *Deleted

## 2022-04-04 ENCOUNTER — Other Ambulatory Visit: Payer: Self-pay

## 2022-04-04 NOTE — Patient Instructions (Signed)
Your procedure is scheduled on: 04/10/22 Report to [facility]. To find out your arrival time please call [phone] between 1PM - 3PM on 04/07/22.  Remember: Instructions that are not followed completely may result in serious medical risk, up to and including death, or upon the discretion of your surgeon and anesthesiologist your surgery may need to be rescheduled.     _X__ 1. Do not eat any food or drink any liquids after midnight the night before your procedure.                 No gum chewing or hard candies.   __X__2.  On the morning of surgery brush your teeth with toothpaste and water, you                 may rinse your mouth with mouthwash if you wish.  Do not swallow any              toothpaste of mouthwash.     _X__ 3.  No Alcohol for 24 hours before or after surgery.   _X__ 4.  Do Not Smoke or use e-cigarettes For 24 Hours Prior to Your Surgery.                 Do not use any chewable tobacco products for at least 6 hours prior to                 surgery.  ____  5.  Bring all medications with you on the day of surgery if instructed.   __X__  6.  Notify your doctor if there is any change in your medical condition      (cold, fever, infections).     Do not wear jewelry, make-up, hairpins, clips or nail polish. Do not wear lotions, powders, or perfumes. May wear deodorant  Do not shave body hair 48 hours prior to surgery. Men may shave face and neck. Do not bring valuables to the hospital.    [facility] is not responsible for any belongings or valuables.  Contacts, dentures/partials or body piercings may not be worn into surgery. Bring a case for your contacts, glasses or hearing aids, a denture cup will be supplied. Leave your suitcase in the car. After surgery it may be brought to your room. For patients admitted to the hospital, discharge time is determined by your treatment team.   Patients discharged the day of  surgery will not be allowed to drive home.   Please read over the following fact sheets that you were given:     __X__ Take these medicines the morning of surgery with A SIP OF WATER:    1. diazepam (VALIUM) 10 MG tablet if needed  2. hydrOXYzine (ATARAX/VISTARIL) 10 MG tablet if needed  3. oxybutynin (DITROPAN) 5 MG tablet if needed  4.  5.  6.  ____ Fleet Enema (as directed)   ____ Use CHG Soap/SAGE wipes as directed  ____ Use inhalers on the day of surgery  ____ Stop metformin/Janumet/Farxiga 2 days prior to surgery    ____ Take 1/2 of usual insulin dose the night before surgery. No insulin the morning          of surgery.   ____ Stop Blood Thinners Coumadin/Plavix/Xarelto/Pleta/Pradaxa/Eliquis/Effient/Aspirin  on   Or contact your Surgeon, Cardiologist or Medical Doctor regarding  ability to stop your blood thinners  __X__ Stop Anti-inflammatories 7 days before surgery such as Advil, Ibuprofen,  Motrin,  BC or Goodies Powder, Naprosyn, Naproxen, Aleve, Aspirin or Meloxicam   May take Tylenol  __X__ Stop all herbals and supplements, fish oil or vitamins for 7 days until after surgery.    __X__ Bring C-Pap to the hospital.

## 2022-04-04 NOTE — Telephone Encounter (Addendum)
Patient informed, voiced understanding. Prefers to have area biopsied while in the OR as stated.  ? ? ? ?----- Message from Hollice Espy, MD sent at 04/04/2022  7:52 AM EDT ----- ?CT urogram looks fine except for your bladder had some asymmetric thickening which may or may not be real.  If we see anything in the operating room that needs to be biopsied, we can do that at the time.  Otherwise, no other concerning findings. ? ?Hollice Espy, MD ? ?

## 2022-04-07 ENCOUNTER — Telehealth: Payer: Self-pay

## 2022-04-07 NOTE — Telephone Encounter (Signed)
Pt calls triage line to inquire about what time she should become NPO prior to her surgery on Monday, advised pt on nothing to eat/drink after midnight. Pt voiced understanding.  ?

## 2022-04-09 MED ORDER — LACTATED RINGERS IV SOLN: INTRAVENOUS | Status: DC

## 2022-04-09 MED ORDER — ORAL CARE MOUTH RINSE
15.0000 mL | Freq: Once | OROMUCOSAL | Status: AC
Start: 2022-04-09 — End: 2022-04-10

## 2022-04-09 MED ORDER — CEFAZOLIN SODIUM-DEXTROSE 2-4 GM/100ML-% IV SOLN
2.0000 g | INTRAVENOUS | Status: AC
  Administered 2022-04-10: 2 g via INTRAVENOUS

## 2022-04-09 MED ORDER — CHLORHEXIDINE GLUCONATE 0.12 % MT SOLN
15.0000 mL | Freq: Once | OROMUCOSAL | Status: AC
Start: 2022-04-09 — End: 2022-04-10

## 2022-04-09 MED ORDER — FAMOTIDINE 20 MG PO TABS
20.0000 mg | ORAL_TABLET | Freq: Once | ORAL | Status: AC
Start: 2022-04-09 — End: 2022-04-10

## 2022-04-10 ENCOUNTER — Ambulatory Visit: Payer: 59 | Admitting: Anesthesiology

## 2022-04-10 ENCOUNTER — Encounter: Payer: Self-pay | Admitting: Urology

## 2022-04-10 ENCOUNTER — Other Ambulatory Visit: Payer: Self-pay

## 2022-04-10 ENCOUNTER — Ambulatory Visit
Admission: RE | Admit: 2022-04-10 | Discharge: 2022-04-10 | Disposition: A | Discharge status: Home / Self Care | Payer: 59 | Attending: Urology | Admitting: Urology

## 2022-04-10 ENCOUNTER — Encounter
Admission: RE | Disposition: A | Discharge status: Home / Self Care | Payer: Self-pay | Source: Home / Self Care | Attending: Urology

## 2022-04-10 DIAGNOSIS — Z6831 Body mass index (BMI) 31.0-31.9, adult: Secondary | ICD-10-CM | POA: Insufficient documentation

## 2022-04-10 DIAGNOSIS — Z9884 Bariatric surgery status: Secondary | ICD-10-CM | POA: Insufficient documentation

## 2022-04-10 DIAGNOSIS — K219 Gastro-esophageal reflux disease without esophagitis: Secondary | ICD-10-CM | POA: Diagnosis not present

## 2022-04-10 DIAGNOSIS — N3010 Interstitial cystitis (chronic) without hematuria: Secondary | ICD-10-CM

## 2022-04-10 DIAGNOSIS — R3129 Other microscopic hematuria: Secondary | ICD-10-CM | POA: Diagnosis not present

## 2022-04-10 DIAGNOSIS — E669 Obesity, unspecified: Secondary | ICD-10-CM | POA: Insufficient documentation

## 2022-04-10 DIAGNOSIS — G4730 Sleep apnea, unspecified: Secondary | ICD-10-CM | POA: Insufficient documentation

## 2022-04-10 DIAGNOSIS — N3011 Interstitial cystitis (chronic) with hematuria: Secondary | ICD-10-CM | POA: Insufficient documentation

## 2022-04-10 DIAGNOSIS — N3289 Other specified disorders of bladder: Secondary | ICD-10-CM | POA: Diagnosis not present

## 2022-04-10 HISTORY — PX: CYSTO WITH HYDRODISTENSION: SHX5453

## 2022-04-10 SURGERY — CYSTOSCOPY, WITH BLADDER HYDRODISTENSION
Anesthesia: General | Site: Bladder

## 2022-04-10 MED ORDER — FENTANYL CITRATE (PF) 100 MCG/2ML IJ SOLN
INTRAMUSCULAR | Status: DC | PRN
  Administered 2022-04-10: 100 ug via INTRAVENOUS

## 2022-04-10 MED ORDER — SUCCINYLCHOLINE CHLORIDE 200 MG/10ML IV SOSY
PREFILLED_SYRINGE | INTRAVENOUS | Status: DC | PRN
  Administered 2022-04-10: 100 mg via INTRAVENOUS

## 2022-04-10 MED ORDER — ACETAMINOPHEN 10 MG/ML IV SOLN: 1000.0000 mg | Freq: Once | INTRAVENOUS | Status: DC | PRN

## 2022-04-10 MED ORDER — DEXMEDETOMIDINE HCL IN NACL 200 MCG/50ML IV SOLN
INTRAVENOUS | Status: DC | PRN
  Administered 2022-04-10: 12 ug via INTRAVENOUS

## 2022-04-10 MED ORDER — DEXAMETHASONE SODIUM PHOSPHATE 10 MG/ML IJ SOLN
INTRAMUSCULAR | Status: DC | PRN
Start: 2022-04-10 — End: 2022-04-10
  Administered 2022-04-10: 10 mg via INTRAVENOUS

## 2022-04-10 MED ORDER — OXYCODONE HCL 5 MG/5ML PO SOLN: 5.0000 mg | Freq: Once | ORAL | Status: AC | PRN

## 2022-04-10 MED ORDER — CEFAZOLIN SODIUM-DEXTROSE 2-4 GM/100ML-% IV SOLN
INTRAVENOUS | Status: AC
  Filled 2022-04-10: qty 100

## 2022-04-10 MED ORDER — FENTANYL CITRATE (PF) 100 MCG/2ML IJ SOLN
INTRAMUSCULAR | Status: AC
Start: 2022-04-10 — End: ?
  Filled 2022-04-10: qty 2

## 2022-04-10 MED ORDER — MIDAZOLAM HCL 2 MG/2ML IJ SOLN
INTRAMUSCULAR | Status: AC
  Administered 2022-04-10: 2 mg via INTRAVENOUS
  Filled 2022-04-10: qty 2

## 2022-04-10 MED ORDER — OXYCODONE HCL 5 MG PO TABS: 5.0000 mg | ORAL_TABLET | Freq: Once | ORAL | Status: AC | PRN

## 2022-04-10 MED ORDER — FAMOTIDINE 20 MG PO TABS
ORAL_TABLET | ORAL | Status: AC
Start: 2022-04-10 — End: 2022-04-10
  Administered 2022-04-10: 20 mg via ORAL
  Filled 2022-04-10: qty 1

## 2022-04-10 MED ORDER — FENTANYL CITRATE (PF) 100 MCG/2ML IJ SOLN
INTRAMUSCULAR | Status: AC
  Administered 2022-04-10: 25 ug via INTRAVENOUS
  Filled 2022-04-10: qty 2

## 2022-04-10 MED ORDER — CHLORHEXIDINE GLUCONATE 0.12 % MT SOLN
OROMUCOSAL | Status: AC
  Administered 2022-04-10: 15 mL via OROMUCOSAL
  Filled 2022-04-10: qty 15

## 2022-04-10 MED ORDER — LIDOCAINE HCL (CARDIAC) PF 100 MG/5ML IV SOSY
PREFILLED_SYRINGE | INTRAVENOUS | Status: DC | PRN
  Administered 2022-04-10: 100 mg via INTRAVENOUS

## 2022-04-10 MED ORDER — LACTATED RINGERS IV SOLN
INTRAVENOUS | Status: DC | PRN
Start: 2022-04-10 — End: 2022-04-10

## 2022-04-10 MED ORDER — ROCURONIUM BROMIDE 100 MG/10ML IV SOLN
INTRAVENOUS | Status: DC | PRN
  Administered 2022-04-10: 30 mg via INTRAVENOUS

## 2022-04-10 MED ORDER — LACTATED RINGERS IV SOLN: INTRAVENOUS | Status: DC

## 2022-04-10 MED ORDER — ONDANSETRON HCL 4 MG/2ML IJ SOLN
INTRAMUSCULAR | Status: DC | PRN
  Administered 2022-04-10: 4 mg via INTRAVENOUS

## 2022-04-10 MED ORDER — ALBUTEROL SULFATE HFA 108 (90 BASE) MCG/ACT IN AERS
INHALATION_SPRAY | RESPIRATORY_TRACT | Status: DC | PRN
  Administered 2022-04-10: 8 via RESPIRATORY_TRACT

## 2022-04-10 MED ORDER — KETOROLAC TROMETHAMINE 30 MG/ML IJ SOLN
INTRAMUSCULAR | Status: DC | PRN
Start: 2022-04-10 — End: 2022-04-10
  Administered 2022-04-10: 30 mg via INTRAVENOUS

## 2022-04-10 MED ORDER — PROPOFOL 10 MG/ML IV BOLUS
INTRAVENOUS | Status: DC | PRN
  Administered 2022-04-10: 100 mg via INTRAVENOUS
  Administered 2022-04-10: 200 mg via INTRAVENOUS

## 2022-04-10 MED ORDER — MIDAZOLAM HCL 2 MG/2ML IJ SOLN: 2.0000 mg | Freq: Once | INTRAMUSCULAR | Status: AC

## 2022-04-10 MED ORDER — STERILE WATER FOR IRRIGATION IR SOLN
Status: DC | PRN
  Administered 2022-04-10: 3000 mL via INTRAVESICAL

## 2022-04-10 MED ORDER — ONDANSETRON HCL 4 MG/2ML IJ SOLN: 4.0000 mg | Freq: Once | INTRAMUSCULAR | Status: DC | PRN

## 2022-04-10 MED ORDER — OXYCODONE HCL 5 MG PO TABS
ORAL_TABLET | ORAL | Status: AC
  Administered 2022-04-10: 5 mg via ORAL
  Filled 2022-04-10: qty 1

## 2022-04-10 MED ORDER — FENTANYL CITRATE (PF) 100 MCG/2ML IJ SOLN
25.0000 ug | INTRAMUSCULAR | Status: DC | PRN
  Administered 2022-04-10 (×2): 50 ug via INTRAVENOUS

## 2022-04-10 SURGICAL SUPPLY — 15 items
BRUSH SCRUB EZ 1% IODOPHOR (MISCELLANEOUS) ×2 IMPLANT
CATH FOLEY 2WAY  5CC 16FR (CATHETERS) ×1
CATH FOLEY 2WAY 5CC 16FR (CATHETERS) ×1
CATH URTH 16FR FL 2W BLN LF (CATHETERS) ×1 IMPLANT
ELECT REM PT RETURN 9FT ADLT (ELECTROSURGICAL) ×2
ELECTRODE REM PT RTRN 9FT ADLT (ELECTROSURGICAL) ×1 IMPLANT
GAUZE 4X4 16PLY ~~LOC~~+RFID DBL (SPONGE) ×4 IMPLANT
GLOVE BIO SURGEON STRL SZ 6.5 (GLOVE) ×2 IMPLANT
MANIFOLD NEPTUNE II (INSTRUMENTS) ×2 IMPLANT
PACK CYSTO AR (MISCELLANEOUS) ×2 IMPLANT
SET CYSTO W/LG BORE CLAMP LF (SET/KITS/TRAYS/PACK) ×2 IMPLANT
SYR 10ML LL (SYRINGE) ×2 IMPLANT
SYR 30ML LL (SYRINGE) ×2 IMPLANT
WATER STERILE IRR 3000ML UROMA (IV SOLUTION) ×2 IMPLANT
WATER STERILE IRR 500ML POUR (IV SOLUTION) ×2 IMPLANT

## 2022-04-10 NOTE — Anesthesia Procedure Notes (Signed)
Procedure Name: Intubation ?Date/Time: 04/10/2022 2:17 PM ?Performed by: Beverely Low, CRNA ?Pre-anesthesia Checklist: Patient identified, Patient being monitored, Timeout performed, Emergency Drugs available and Suction available ?Patient Re-evaluated:Patient Re-evaluated prior to induction ?Oxygen Delivery Method: Circle system utilized ?Preoxygenation: Pre-oxygenation with 100% oxygen ?Induction Type: IV induction ?Ventilation: Mask ventilation without difficulty ?Laryngoscope Size: McGraph and 4 ?Grade View: Grade I ?Tube type: Oral ?Tube size: 7.0 mm ?Number of attempts: 1 ?Airway Equipment and Method: Stylet ?Placement Confirmation: ETT inserted through vocal cords under direct vision, positive ETCO2 and breath sounds checked- equal and bilateral ?Secured at: 21 cm ?Tube secured with: Tape ?Dental Injury: Teeth and Oropharynx as per pre-operative assessment  ? ? ? ? ?

## 2022-04-10 NOTE — Interval H&P Note (Signed)
History and Physical Interval Note: ? ?04/10/2022 ?1:52 PM ? ?Lynn Dennis  has presented today for surgery, with the diagnosis of Interstitial Cystitis.  The various methods of treatment have been discussed with the patient and family. After consideration of risks, benefits and other options for treatment, the patient has consented to  Procedure(s): ?CYSTOSCOPY/HYDRODISTENSION (N/A) as a surgical intervention.  The patient's history has been reviewed, patient examined, no change in status, stable for surgery.  I have reviewed the patient's chart and labs.  Questions were answered to the patient's satisfaction.   ? ?RRR ?CTAB ? ?Preoperative CT urogram was personally reviewed.  Some bladder wall thickening laterally but bladder is underdistended.  We discussed today if there is any pathology, we will plan on biopsying the bladder.  She is agreeable this plan. ? ? ?Hollice Espy ? ? ?

## 2022-04-10 NOTE — Transfer of Care (Signed)
Immediate Anesthesia Transfer of Care Note ? ?Patient: Lynn Dennis ? ?Procedure(s) Performed: CYSTOSCOPY/HYDRODISTENSION WITH BLADDER BIOPSY  (Bladder) ? ?Patient Location: PACU ? ?Anesthesia Type:General ? ?Level of Consciousness: drowsy ? ?Airway & Oxygen Therapy: Patient Spontanous Breathing and Patient connected to face mask oxygen ? ?Post-op Assessment: Report given to RN and Post -op Vital signs reviewed and stable ? ?Post vital signs: Reviewed and stable ? ?Last Vitals:  ?Vitals Value Taken Time  ?BP 128/84 04/10/22 1502  ?Temp 36.3 ?C 04/10/22 1502  ?Pulse 85 04/10/22 1508  ?Resp 17 04/10/22 1508  ?SpO2 98 % 04/10/22 1508  ?Vitals shown include unvalidated device data. ? ?Last Pain:  ?Vitals:  ? 04/10/22 1120  ?TempSrc: Oral  ?PainSc: 0-No pain  ?   ? ?  ? ?Complications: No notable events documented. ?

## 2022-04-10 NOTE — Discharge Instructions (Addendum)

## 2022-04-10 NOTE — Anesthesia Preprocedure Evaluation (Signed)
Anesthesia Evaluation  ?Patient identified by MRN, date of birth, ID band ?Patient awake ? ? ? ?Reviewed: ?Allergy & Precautions, NPO status , Patient's Chart, lab work & pertinent test results ? ?History of Anesthesia Complications ?Negative for: history of anesthetic complications ? ?Airway ?Mallampati: III ? ? ?Neck ROM: Full ? ? ? Dental ? ?(+) Missing ?  ?Pulmonary ?sleep apnea and Continuous Positive Airway Pressure Ventilation ,  ?  ?Pulmonary exam normal ?breath sounds clear to auscultation ? ? ? ? ? ? Cardiovascular ?Exercise Tolerance: Good ?negative cardio ROS ?Normal cardiovascular exam ?Rhythm:Regular Rate:Normal ? ? ?  ?Neuro/Psych ?negative neurological ROS ?   ? GI/Hepatic ?hiatal hernia, GERD  ,S/p lap band ?  ?Endo/Other  ?Obesity  ? Renal/GU ?negative Renal ROS  ? ?  ?Musculoskeletal ? ?(+) Arthritis ,  ? Abdominal ?  ?Peds ? Hematology ?negative hematology ROS ?(+)   ?Anesthesia Other Findings ? ? Reproductive/Obstetrics ? ?  ? ? ? ? ? ? ? ? ? ? ? ? ? ?  ?  ? ? ? ? ? ? ? ? ?Anesthesia Physical ?Anesthesia Plan ? ?ASA: 2 ? ?Anesthesia Plan: General  ? ?Post-op Pain Management:   ? ?Induction: Intravenous ? ?PONV Risk Score and Plan: 3 and Ondansetron, Dexamethasone and Treatment may vary due to age or medical condition ? ?Airway Management Planned: Oral ETT ? ?Additional Equipment:  ? ?Intra-op Plan:  ? ?Post-operative Plan: Extubation in OR ? ?Informed Consent: I have reviewed the patients History and Physical, chart, labs and discussed the procedure including the risks, benefits and alternatives for the proposed anesthesia with the patient or authorized representative who has indicated his/her understanding and acceptance.  ? ? ? ?Dental advisory given ? ?Plan Discussed with: CRNA ? ?Anesthesia Plan Comments: (Patient consented for risks of anesthesia including but not limited to:  ?- adverse reactions to medications ?- damage to eyes, teeth, lips or other oral  mucosa ?- nerve damage due to positioning  ?- sore throat or hoarseness ?- damage to heart, brain, nerves, lungs, other parts of body or loss of life ? ?Informed patient about role of CRNA in peri- and intra-operative care.  Patient voiced understanding.)  ? ? ? ? ? ? ?Anesthesia Quick Evaluation ? ?

## 2022-04-10 NOTE — Op Note (Signed)
Date of procedure: 04/10/22 ? ?Preoperative diagnosis:  ?Microscopic hematuria ?Bladder wall thickening ?Interstitial cystitis ? ?Postoperative diagnosis:  ?Same as above ? ?Procedure: ?Cystoscopy with bladder biopsy ?Hydrodistention of the bladder ? ?Surgeon: Hollice Espy, MD ? ?Anesthesia: General ? ?Complications: None ? ?Intraoperative findings: Erythema primarily in the right lateral wall of the bladder which is nonspecific, weeping of the bladder mucosa with minimal distention and manipulation.  See photos under media.  Bladder biopsy 8 on right lateral bladder wall.  Lower bladder capacity, 375 cc. ? ?EBL: Minimal ? ?Specimens: Bladder biopsy ? ?Drains: None ? ?Indication: Lynn Dennis is a 61 y.o. patient with personal history of IC, microscopic hematuria also found to have some right lateral bladder wall thickening on CT scan in the absence of infection.  After reviewing the management options for treatment, he elected to proceed with the above surgical procedure(s). We have discussed the potential benefits and risks of the procedure, side effects of the proposed treatment, the likelihood of the patient achieving the goals of the procedure, and any potential problems that might occur during the procedure or recuperation. Informed consent has been obtained. ? ?Description of procedure: ? ?The patient was taken to the operating room and general anesthesia was induced.  The patient was placed in the dorsal lithotomy position, prepped and draped in the usual sterile fashion, and preoperative antibiotics were administered. A preoperative time-out was performed.  ? ?58 [place] scope was advanced per urethra into the bladder.  Notably, the bladder was erythematous, more so on the right lateral bladder wall near the area of thickening on CT scan.  There was some erythema on the posterior as well as left lateral bladder wall but it predominantly seemed focused over the right side.  With minimal bladder filling  and manipulation of the bladder, multiple patches began weeping blood consistent with severe IC as previously noted. ? ?I did elect to go ahead and biopsy 3 areas over the right lateral and posterior bladder wall which were representative of the erythema.  Bugbee electrocautery was then used to fulgurate these areas.  Once hemostasis was achieved, I then performed hydrodistention by filling the bladder to capacity such that she began to leak around the urethral meatus.  Once this occurred, I drained the bladder with a bladder capacity of 375 cc.  I repeated this procedure second time confirming the same bladder capacity, 375 cc.  The urine was light pink without any active bleeding noted.  I then drained her bladder at which time she was reversed from anesthesia and taken to the PACU in stable condition. ? ?Plan: I will call her with her biopsy results.  We will plan to see her in about 6 months unless her symptoms worsen. ? ?Hollice Espy, M.D. ? ? ?

## 2022-04-11 ENCOUNTER — Encounter: Payer: Self-pay | Admitting: Urology

## 2022-04-11 NOTE — Anesthesia Postprocedure Evaluation (Signed)
Anesthesia Post Note ? ?Patient: Lynn Dennis ? ?Procedure(s) Performed: CYSTOSCOPY/HYDRODISTENSION WITH BLADDER BIOPSY  (Bladder) ? ?Patient location during evaluation: PACU ?Anesthesia Type: General ?Level of consciousness: awake and alert, oriented and patient cooperative ?Pain management: pain level controlled ?Vital Signs Assessment: post-procedure vital signs reviewed and stable ?Respiratory status: spontaneous breathing, nonlabored ventilation and respiratory function stable ?Cardiovascular status: blood pressure returned to baseline and stable ?Postop Assessment: adequate PO intake ?Anesthetic complications: no ? ? ?No notable events documented. ? ? ?Last Vitals:  ?Vitals:  ? 04/10/22 1600 04/10/22 1625  ?BP: 122/63 134/76  ?Pulse: 70 (!) 58  ?Resp: (!) 25 17  ?Temp:  (!) 36.3 ?C  ?SpO2: 93% 97%  ?  ?Last Pain:  ?Vitals:  ? 04/11/22 0907  ?TempSrc:   ?PainSc: 2   ? ? ?  ?  ?  ?  ?  ?  ? ?Darrin Nipper ? ? ? ? ?

## 2022-04-12 LAB — SURGICAL PATHOLOGY

## 2022-07-03 ENCOUNTER — Other Ambulatory Visit: Payer: Self-pay | Admitting: Internal Medicine

## 2022-07-03 DIAGNOSIS — Z1231 Encounter for screening mammogram for malignant neoplasm of breast: Secondary | ICD-10-CM

## 2022-07-05 ENCOUNTER — Telehealth: Payer: Self-pay

## 2022-07-05 ENCOUNTER — Other Ambulatory Visit: Payer: Self-pay

## 2022-07-05 DIAGNOSIS — N3010 Interstitial cystitis (chronic) without hematuria: Secondary | ICD-10-CM

## 2022-07-05 NOTE — Telephone Encounter (Signed)
Pt called looking for records to be sent to a specialist. Advised pt she must come in to sign record release. Pt aware and will come in to sign. Form is at my desk

## 2022-07-05 NOTE — Telephone Encounter (Signed)
Patient called asking for a referral  for pelvic floor therapy

## 2022-07-11 NOTE — Telephone Encounter (Signed)
Referral placed, Patient informed.

## 2022-07-24 ENCOUNTER — Ambulatory Visit: Payer: 59

## 2022-07-25 ENCOUNTER — Ambulatory Visit
Admission: RE | Admit: 2022-07-25 | Discharge: 2022-07-25 | Disposition: A | Discharge status: Home / Self Care | Payer: 59 | Source: Ambulatory Visit | Attending: Internal Medicine | Admitting: Internal Medicine

## 2022-07-25 DIAGNOSIS — Z1231 Encounter for screening mammogram for malignant neoplasm of breast: Secondary | ICD-10-CM | POA: Insufficient documentation

## 2022-08-02 ENCOUNTER — Ambulatory Visit: Payer: 59 | Attending: Urology

## 2022-08-02 DIAGNOSIS — R102 Pelvic and perineal pain unspecified side: Secondary | ICD-10-CM

## 2022-08-02 DIAGNOSIS — M6289 Other specified disorders of muscle: Secondary | ICD-10-CM

## 2022-08-02 DIAGNOSIS — N3010 Interstitial cystitis (chronic) without hematuria: Secondary | ICD-10-CM | POA: Diagnosis not present

## 2022-08-02 DIAGNOSIS — R278 Other lack of coordination: Secondary | ICD-10-CM | POA: Diagnosis present

## 2022-08-02 NOTE — Therapy (Signed)
OUTPATIENT PHYSICAL THERAPY FEMALE PELVIC EVALUATION   Patient Name: Lynn Dennis MRN: [id number] DOB:[birth year], 61 y.o., female Today's Date: 08/02/2022   PT End of Session - 08/02/22 1529     Visit Number 1    Number of Visits 10    Date for PT Re-Evaluation 10/11/22    Authorization Type IE: 08/02/22    PT Start Time 1530    PT Stop Time 1610    PT Time Calculation (min) 40 min    Activity Tolerance Patient tolerated treatment well             Past Medical History:  Diagnosis Date   Arthritis    neck   Cancer ([place])    squamous cell skin cancer   GERD (gastroesophageal reflux disease)    only time is when lap band is loosened   History of hiatal hernia    fixed with lap band surgery   Pneumonia 2014   Sleep apnea    uses cpap   Past Surgical History:  Procedure Laterality Date   ABDOMINAL HYSTERECTOMY     CYSTO WITH HYDRODISTENSION N/A 03/20/2018   Procedure: CYSTOSCOPY/HYDRODISTENSION;  Surgeon: Hollice Espy, MD;  Location: [facility];  Service: Urology;  Laterality: N/A;   CYSTO WITH HYDRODISTENSION N/A 04/10/2022   Procedure: CYSTOSCOPY/HYDRODISTENSION WITH BLADDER BIOPSY ;  Surgeon: Hollice Espy, MD;  Location: [facility];  Service: Urology;  Laterality: N/A;   CYSTOSCOPY WITH BIOPSY N/A 03/20/2018   Procedure: CYSTOSCOPY WITH Bladder BIOPSY;  Surgeon: Hollice Espy, MD;  Location: [facility];  Service: Urology;  Laterality: N/A;   CYSTOSCOPY WITH FULGERATION N/A 03/20/2018   Procedure: CYSTOSCOPY WITH FULGERATION;  Surgeon: Hollice Espy, MD;  Location: [facility];  Service: Urology;  Laterality: N/A;   FRACTURE SURGERY     fractured finger   HEEL SPUR SURGERY Right    HERNIA REPAIR     age 62 inguinal    KNEE ARTHROSCOPY Left    LAPAROSCOPIC GASTRIC BANDING     PANNICULECTOMY  04/2021   SHOULDER ARTHROSCOPY WITH DEBRIDEMENT AND BICEP TENDON REPAIR Right 08/12/2015   Procedure: SHOULDER ARTHROSCOPY WITH DEBRIDMENT, DECOMPRESSION, SLAP REPAIR, MINI  OPEN BICEPS TENDONESIS;  Surgeon: Corky Mull, MD;  Location: [facility];  Service: Orthopedics;  Laterality: Right;   TONSILLECTOMY     Patient Active Problem List   Diagnosis Date Noted   Panniculitis 12/31/2020   Back pain 12/31/2020   Varicose veins of both lower extremities with pain 11/06/2018   Bilateral lower extremity edema 11/06/2018    PCP: Rusty Aus, MD  REFERRING PROVIDER: Hollice Espy, MD   REFERRING DIAG:  N30.10 (ICD-10-CM) - IC (interstitial cystitis)   THERAPY DIAG:  Pelvic floor dysfunction  Other lack of coordination  Pelvic pain  Rationale for Evaluation and Treatment: Rehabilitation  ONSET DATE: 1 year ago   RED FLAGS: N/A Have you had any night sweats? Unexplained weight loss? Saddle anesthesia? Unexplained changes in bowel or bladder habits?   SUBJECTIVE: Patient confirms identification and approves PT to assess pelvic floor and treatment Yes  PRECAUTIONS: None  WEIGHT BEARING RESTRICTIONS: No  FALLS:  Has patient fallen in last 6 months? No  OCCUPATION/SOCIAL ACTIVITIES: Retired, play instruments, swimming, walking  PLOF: Independent    CHIEF CONCERN: Pt has IC and sxs started after hysterectomy. Pt thought it was the start of a UTI but it was not and nothing got better. Last year was the first time Pt experienced a bladder spasm. Pt would do her "pelvic floor" exercises that involved adductor stretch and things she learned from another PT. That triggered a bladder spasm. Pt noticed during increased physical activity one day outside and caused another bladder spasm. Pt reports the spasms really hurt and at times she is not able to pee and she strains to push out urine. Pt cannot drive long distances because she will have increased pain in the lower  abdomen and radiates towards the vagina.    PAIN:  Are you having pain? Yes NPRS scale: 10/10 (worst) Pain location: Internal, Deep, and Vaginal  Pain type: burning and sharp Pain description: intermittent, sharp, and burning   Aggravating factors: increased activity,  Relieving factors: time (10-15 mins), combination of meds    LIVING ENVIRONMENT: Lives with: lives with their spouse Lives in: House/apartment   PATIENT GOALS: Pt would like to find ways to exercise/physical activity that will not trigger a bladder spasm and help reduce the pain    UROLOGICAL HISTORY Fluid intake: non-acidic tea/no caffeine, water (cannot give answer to how many)  Pain with urination: Yes, straining  Fully empty bladder: Yes Stream: constant Urgency: Yes Frequency: At least once an hour, if not more Nocturia: 1x an hour, getting at least 8-9 hrs of sleep Leakage: No Bladder control (0-10): 2/10   GASTROINTESTINAL HISTORY Pain with bowel movement: No Fiber supplement: Yes   SEXUAL HISTORY/FUNCTION Pain with intercourse: Initial Penetration Ability to have vaginal penetration:  Yes: but with pain ; Deep thrusting: Yes Able to achieve orgasm?: Yes  OBSTETRICAL HISTORY Deferred 2/2 time constraints  Vaginal deliveries:  Tearing:  C-section deliveries:   GYNECOLOGICAL HISTORY Hysterectomy: Abdominal, yes Pelvic Organ Prolapse: None Pain with exam: no Heaviness/pressure: no   OBJECTIVE:   DIAGNOSTIC TESTING/IMPRESSIONS:  04/10/22 Biopsy - "Pathology benign, consistent with chronic cystitis.  Lets see you in 6 mo for recheck or sooner as needed."     COGNITION: Overall cognitive status: Within functional limits for tasks assessed     POSTURE:  Grossly posterior pelvic tilt in sitting with occasional B plantarflexion throughout session  Lumbar lordosis:   Thoracic kyphosis: Deferred 2/2 time constraints Iliac crest height:  Lumbar lateral shift:  Pelvic obliquity:  Leg  length discrepancy:    SENSATION: Deferred 2/2 time constraints Light touch: , L2-S2 dermatomes  Proprioception:    RANGE OF MOTION:  Deferred 2/2 time constraints  (Norm range in degrees)  LEFT  RIGHT   Lumbar forward flexion (65):      Lumbar extension (30):     Lumbar lateral flexion (25):     Thoracic and Lumbar rotation (30 degrees):       Hip Flexion (0-125):      Hip IR (0-45):     Hip ER (0-45):     Hip Adduction:      Hip Abduction (0-40):     Hip extension (0-15):     (*= pain, Blank rows = not tested)   STRENGTH: MMT  Deferred 2/2 time constraints  RLE  LLE   Hip Flexion    Hip Extension  Hip Abduction     Hip Adduction     Hip ER     Hip IR     Knee Extension    Knee Flexion    Dorsiflexion     Plantarflexion (seated)    (*= pain, Blank rows = not tested)   SPECIAL TESTS: Deferred 2/2 time constraints Centralization and Peripheralization (SN 92, -LR 0.12):  Slump (SN 83, -LR 0.32):  SLR (SN 92, -LR 0.29): R: Lumbar quadrant (SN 70): R:  FABER (SN 81): FADIR (SN 94):  Hip scour (SN 50):  Thigh Thrust (SN 88, -LR 0.18) : Distraction (RS85):  Compression (SN/SP 69): Stork/March (SP 93):   PALPATION: Deferred 2/2 time constraints Abdominal:  Diastasis:  finger above umbilicus,  fingers at and below umbilicus  Scar mobility: present/mobile perpendicular, parallel Rib flare: present/absent  EXTERNAL PELVIC EXAM: Patient educated on the purpose of the pelvic exam and articulated understanding; patient consented to the exam verbally. Deferred 2/2 time constraints Palpation: Breath coordination: present/absent/inconsistent Voluntary Contraction: present/absent Relaxation: full/delayed/non-relaxing Perineal movement with sustained IAP increase ("bear down"): descent/no change/elevation/excessive descent Perineal movement with rapid IAP increase ("cough"): elevation/no change/descent Pubic symphysis: (0= no contraction, 1= flicker, 2= weak  squeeze, 3= fair squeeze with lift, 4= good squeeze and lift against resistance, 5= strong squeeze against strong resistance)   INTERNAL PELVIC EXAM: Patient educated on the purpose of the pelvic exam and articulated understanding; patient consented to the exam verbally. Deferred 2/2 to time constraints Introitus Appears:  Skin integrity:  Scar mobility: Strength (PERF):  Symmetry: Palpation: Prolapse: (0= no contraction, 1= flicker, 2= weak squeeze, 3= fair squeeze with lift, 4= good squeeze and lift against resistance, 5= strong squeeze against strong resistance)    TODAY'S TREATMENT: EVAL + Treat   Therapeutic Activity: Toileting posture with elevated feet and diaphragmatic breathing. DPT demonstrated with squatty potty and Pt verbalized having one at home but rarely using it. Handout given about toileting posture.   Pt asked about kegel exercises and exercises the previous PT she was seeing for a different concern gave her. DPT stated for now, to avoid kegels until DPT completes comprehensive physical assessment and examines the PFM. Pt agreed and verbalized understanding.    Patient Education:  Patient educated on what to expect during course of physical therapy, POC, and provided with HEP including: toileting posture handout and bladder irritants handout. Patient verbalized understanding and returned demonstration. Patient will benefit from further education in order to maximize compliance and understanding for long-term therapeutic gains.   Patient Surveys:  {rehab surveys:24030}    ASSESSMENT:  Clinical Impression: Patient is a 61 y.o. who was seen today for physical therapy evaluation and treatment for a chief concern of ***. PLOF. Today's evaluation suggest deficits in PFM coordination, breathing mechanics, PFM extensibility, posture, pain, scar mobility, gait, balance, hypermobility, as evidenced by ***. Upon physical examination Pt demonstrates *** as evidenced by ***.  Patient's responses on FOTO *** indicates *** limitation/disability/distress. Patient's progress may be limited due to *** ; however, patient's *** is advantageous. Pt with basic understanding of ***. Patient will benefit from skilled therapeutic intervention to address deficits in *** in order to increase PLOF and improve overall QOL.    Objective Impairments: {opptimpairments:25111}.   Activity Limitations: {activitylimitations:27494}  Personal Factors: {Personal factors:25162} are also affecting patient's functional outcome.   Rehab Potential: {rehabpotential:25112}  Clinical Decision Making: {clinical decision making:25114}  Evaluation Complexity: {Evaluation complexity:25115}   GOALS: Goals reviewed with patient? {yes/no:20286}  SHORT TERM GOALS:  Target date: {follow up:25551}  *** Baseline: Goal status: {GOALSTATUS:25110}  2.  *** Baseline:  Goal status: {GOALSTATUS:25110}  3.  *** Baseline:  Goal status: {GOALSTATUS:25110}  4.  *** Baseline:  Goal status: {GOALSTATUS:25110}  5.  *** Baseline:  Goal status: {GOALSTATUS:25110}  6.  *** Baseline:  Goal status: {GOALSTATUS:25110}  LONG TERM GOALS: Target date: {follow up:25551}   *** Baseline:  Goal status: {GOALSTATUS:25110}  2.  *** Baseline:  Goal status: {GOALSTATUS:25110}  3.  *** Baseline:  Goal status: {GOALSTATUS:25110}  4.  *** Baseline:  Goal status: {GOALSTATUS:25110}  5.  *** Baseline:  Goal status: {GOALSTATUS:25110}  6.  *** Baseline:  Goal status: {GOALSTATUS:25110}  PLAN: PT Frequency: {rehab frequency:25116}  PT Duration: {rehab duration:25117}  Planned Interventions: {rehab planned interventions:25118::"Therapeutic exercises","Therapeutic activity","Neuromuscular re-education","Balance training","Gait training","Patient/Family education","Self Care","Joint mobilization"}  Plan For Next Session: ***   Safiyya Stokes E Shalissa Easterwood, PT 08/02/2022, 7:05 PM

## 2022-08-09 ENCOUNTER — Ambulatory Visit: Payer: 59

## 2022-08-09 DIAGNOSIS — R278 Other lack of coordination: Secondary | ICD-10-CM

## 2022-08-09 DIAGNOSIS — M6289 Other specified disorders of muscle: Secondary | ICD-10-CM | POA: Diagnosis not present

## 2022-08-09 DIAGNOSIS — R102 Pelvic and perineal pain: Secondary | ICD-10-CM

## 2022-08-09 NOTE — Therapy (Signed)
OUTPATIENT PHYSICAL THERAPY FEMALE PELVIC TREATMENT   Patient Name: Lynn Dennis MRN: [id number] DOB:October 20, 1961, 61 y.o., female Today's Date: 08/09/2022   PT End of Session - 08/09/22 1529     Visit Number 2    Number of Visits 10    Date for PT Re-Evaluation 10/11/22    Authorization Type IE: 08/02/22    PT Start Time 1530    PT Stop Time 1610    PT Time Calculation (min) 40 min    Activity Tolerance Patient tolerated treatment well             Past Medical History:  Diagnosis Date   Arthritis    neck   Cancer ([place])    squamous cell skin cancer   GERD (gastroesophageal reflux disease)    only time is when lap band is loosened   History of hiatal hernia    fixed with lap band surgery   Pneumonia 2014   Sleep apnea    uses cpap   Past Surgical History:  Procedure Laterality Date   ABDOMINAL HYSTERECTOMY     CYSTO WITH HYDRODISTENSION N/A 03/20/2018   Procedure: CYSTOSCOPY/HYDRODISTENSION;  Surgeon: Hollice Espy, MD;  Location: [facility];  Service: Urology;  Laterality: N/A;   CYSTO WITH HYDRODISTENSION N/A 04/10/2022   Procedure: CYSTOSCOPY/HYDRODISTENSION WITH BLADDER BIOPSY ;  Surgeon: Hollice Espy, MD;  Location: [facility];  Service: Urology;  Laterality: N/A;   CYSTOSCOPY WITH BIOPSY N/A 03/20/2018   Procedure: CYSTOSCOPY WITH Bladder BIOPSY;  Surgeon: Hollice Espy, MD;  Location: [facility];  Service: Urology;  Laterality: N/A;   CYSTOSCOPY WITH FULGERATION N/A 03/20/2018   Procedure: CYSTOSCOPY WITH FULGERATION;  Surgeon: Hollice Espy, MD;  Location: [facility];  Service: Urology;  Laterality: N/A;   FRACTURE SURGERY     fractured finger   HEEL SPUR SURGERY Right    HERNIA REPAIR     age 61 inguinal    KNEE ARTHROSCOPY Left    LAPAROSCOPIC GASTRIC BANDING     PANNICULECTOMY  04/2021   SHOULDER ARTHROSCOPY WITH DEBRIDEMENT AND BICEP TENDON REPAIR Right 08/12/2015   Procedure: SHOULDER ARTHROSCOPY WITH DEBRIDMENT, DECOMPRESSION, SLAP REPAIR, MINI OPEN  BICEPS TENDONESIS;  Surgeon: Corky Mull, MD;  Location: [facility];  Service: Orthopedics;  Laterality: Right;   TONSILLECTOMY     Patient Active Problem List   Diagnosis Date Noted   Panniculitis 12/31/2020   Back pain 12/31/2020   Varicose veins of both lower extremities with pain 11/06/2018   Bilateral lower extremity edema 11/06/2018    PCP: Rusty Aus, MD  REFERRING PROVIDER: Hollice Espy, MD   REFERRING DIAG:  N30.10 (ICD-10-CM) - IC (interstitial cystitis)   THERAPY DIAG:  Pelvic floor dysfunction  Other lack of coordination  Pelvic pain  Rationale for Evaluation and Treatment: Rehabilitation  ONSET DATE: 1 year ago   PRECAUTIONS: None  WEIGHT BEARING RESTRICTIONS: No  FALLS:  Has patient fallen in last 6 months? No  OCCUPATION/SOCIAL ACTIVITIES: Retired, play instruments, swimming, walking  PLOF: Independent    CHIEF CONCERN: Pt has IC and sxs started after hysterectomy. Pt thought it was the start of a UTI but it was not and nothing got better. Last year was the first time Pt experienced a bladder spasm. Pt would do her "pelvic floor" exercises that involved adductor stretch and things she learned from another PT. That triggered a bladder spasm. Pt noticed during increased physical activity one day outside and caused another bladder spasm. Pt reports the spasms  really hurt and at times she is not able to pee and she strains to push out urine. Pt cannot drive long distances because she will have increased pain in the lower abdomen and radiates towards the vagina.   NPRS scale: 10/10 (worst) Pain location: Internal, Deep, and Vaginal  Pain type: burning and sharp Pain description: intermittent, sharp, and burning   Aggravating factors: increased activity,  Relieving factors: time (10-15 mins), combination of meds    PATIENT GOALS: Pt would like to find ways to exercise/physical activity that will not trigger a bladder spasm and help reduce the  pain    UROLOGICAL HISTORY Fluid intake: non-acidic tea/no caffeine, water (cannot give answer to how many)  Pain with urination: Yes, straining  Fully empty bladder: Yes Stream: constant Urgency: Yes Frequency: At least once an hour, if not more Nocturia: 1x an hour, getting at least 8-9 hrs of sleep Leakage: No Bladder control (0-10): 2/10   GASTROINTESTINAL HISTORY Pain with bowel movement: No Fiber supplement: Yes   SEXUAL HISTORY/FUNCTION Pain with intercourse: Initial Penetration Ability to have vaginal penetration:  Yes: but with pain ; Deep thrusting: Yes Able to achieve orgasm?: Yes  OBSTETRICAL HISTORY Deferred 2/2 time constraints  Vaginal deliveries:  Tearing:  C-section deliveries:   GYNECOLOGICAL HISTORY Hysterectomy: Abdominal, yes Pelvic Organ Prolapse: None Pain with exam: no Heaviness/pressure: no   SUBJECTIVE:  Pt has no changes from initial evaluation. Pt has been practicing toileting posture and breathing which has helped with urinary elimination.   PAIN:  Are you having pain?     No                                                                                                                                                           TODAY'S TREATMENT  Neuromuscular Re-education   Pre-treatment assessment  OBJECTIVE:   DIAGNOSTIC TESTING/IMPRESSIONS:  04/10/22 Biopsy - "Pathology benign, consistent with chronic cystitis.  Lets see you in 6 mo for recheck or sooner as needed."     COGNITION: Overall cognitive status: Within functional limits for tasks assessed     POSTURE:    Thoracic kyphosis: slight in standing, L shoulder elevated Iliac crest height: L iliac crest Pelvic obliquity: L posteriorly rotated   RANGE OF MOTION:    (Norm range in degrees)  LEFT 08/09/22 RIGHT 08/09/22  Lumbar forward flexion (65):  WNL    Lumbar extension (30): WNL    Lumbar lateral flexion (25):  Restricted WNL  Thoracic and Lumbar rotation (30  degrees):    WNL WNL  Hip Flexion (0-125):   WNL WNL  Hip IR (0-45):  WNL WNL  Hip ER (0-45):  WNL WNL  Hip Adduction:      Hip Abduction (0-40):  WNL WNL  Hip extension (0-15):     (*=  pain, Blank rows = not tested)   STRENGTH: MMT    RLE 08/09/22 LLE 08/09/22  Hip Flexion 4 5  Hip Extension 4 4  Hip Abduction     Hip Adduction     Hip ER  5 4  Hip IR  5 5  Knee Extension 5 5  Knee Flexion 5 5  Dorsiflexion     Plantarflexion (seated) 5 5  (*= pain, Blank rows = not tested)   SPECIAL TESTS:  FABER (SN 81): negative  B FADIR (SN 94): negative B   PALPATION:  Abdominal:  Diastasis: none Scar mobility: present from L ASIS to R ASIS right above pubic bone, significant scar restriction along scar and above   -Tension throughout abdominal cavity more significant in center of upper quadrant/center of abdomen/LLQ and RLQ  EXTERNAL PELVIC EXAM: Patient educated on the purpose of the pelvic exam and articulated understanding; patient consented to the exam verbally. Deferred 2/2 time constraints Palpation: Breath coordination: present/absent/inconsistent Voluntary Contraction: present/absent Relaxation: full/delayed/non-relaxing Perineal movement with sustained IAP increase ("bear down"): descent/no change/elevation/excessive descent Perineal movement with rapid IAP increase ("cough"): elevation/no change/descent Pubic symphysis: (0= no contraction, 1= flicker, 2= weak squeeze, 3= fair squeeze with lift, 4= good squeeze and lift against resistance, 5= strong squeeze against strong resistance)   Manual Therapy: Abdominal myofascial release for improved fascial sling tension and mobility in the abdomen   Scar mobilization along the surgical scar for improved tissue extensibility Some discomfort/tender but no increased pain upon superficial/deep manual intervention Discussion on scar massage as part of HEP. Given handout  Neuromuscular Re-education: Seated diaphragmatic  breathing with VCs and TCs for downregulation of the nervous system and improved management of IAP  Right sidelying thoracic rotations, x10, for improved lengthening of the anterior fascial slings    Patient response to interventions: Pt able to feel stretch at the L fascial sling during thoracic rotation   Patient Education:  Patient provided with HEP including: scar massage handout and sidelying thoracic rotation. Patient educated throughout session on appropriate technique and form using multi-modal cueing, HEP, and activity modification. Patient will benefit from further education in order to maximize compliance and understanding for long-term therapeutic gains.    ASSESSMENT:  Clinical Impression: Patient presents to clinic with excellent motivation to participate in today's session. Upon physical examination, Pt demonstrates deficits in LE strength, pain, scar mobility, posture, sleep hygiene, and ROM as evidenced by increased L iliac crest height and L posteriorly rotated innominate, restricted L lateral flexion, 4/5 MMT with B hip ext/R hip flex, significant scar restriction along scar from panniculectomy in 2022 which increased discomfort/tenderness (scar line from L ASIS to R ASIS), and muscular tension upon palpation at the center of the abdomen (upper quadrants and lower quadrants). Will perform PFM external exam to address suggested deficits of PFM coordination and PFM extensibility. Pt verbalized understanding on how-to perform scar massage as part of HEP. Pt responded positively to manual, active, and educational interventions. Patient will continue to benefit from skilled therapeutic intervention to address deficits in PFM coordination, PFM extensibility, LE strength, pain, scar mobility, posture, sleep hygiene, and ROM in order to increase PLOF and improve overall QOL.    Objective Impairments: decreased coordination, decreased endurance, decreased strength, increased fascial  restrictions, increased muscle spasms, improper body mechanics, postural dysfunction, and pain.   Activity Limitations: lifting, bending, standing, toileting, and locomotion level  Personal Factors: Age, Behavior pattern, Past/current experiences, Time since onset of injury/illness/exacerbation, and 1-2 comorbidities: sleep  apnea and GERD  are also affecting patient's functional outcome.   Rehab Potential: Good  Clinical Decision Making: Evolving/moderate complexity  Evaluation Complexity: Moderate   GOALS: Goals reviewed with patient? Yes  SHORT TERM GOALS: Target date: 09/13/2022  Patient will demonstrate independence with HEP in order to maximize therapeutic gains and improve carryover from physical therapy sessions to ADLs in the home and community. Baseline: toileting posture/bladder irritants handout Goal status: INITIAL   LONG TERM GOALS: Target date: 10/18/2022   Patient will score  >/= 70 on FOTO Urinary Problem in order to demonstrate improved PFM coordination, improved PFM strength, and overall improved QOL.  Baseline: 67 Goal status: INITIAL  2.  Patient will report confidence in ability to control bladder > 6/10 in order to demonstrate improved function and ability to participate more fully in activities at home and in the community. Baseline: 2/10 Goal status: INITIAL  3.  Patient will report a decrease in voiding intervals during the night in order to demonstrate improved control of the bladder, PFM coordination, sleep quality, and overall QOL.  Baseline: at least 1x/hour Goal status: INITIAL  4.  Patient will decrease worst pain as reported on NPRS by at least 2 points to demonstrate clinically significant reduction in pain in order to restore/improve function and overall QOL. Baseline: 10/10 pelvic pain (bladder spasm) Goal status: INITIAL  5.  Patient will demonstrate coordinated lengthening and relaxation of PFM with diaphragmatic inhalation in order to  decrease spasm and allow for unrestricted elimination of urine/feces for improved overall QOL. Baseline: has to strain to urinate at times/will assess next visit Goal status: INITIAL  6.  Patient will demonstrate understanding of basic self-management/down-regulation of the nervous system for persistent pain condition and stress as evidenced by diaphragmatic breathing without cueing, body scan/progressive relaxation meditation, and improved sleep hygiene including: minimal disruption to void during the night in order to transition to independent management of patient's chief concern: interstitial cystitis.  Baseline: will continue to assess in following visits Goal status: INITIAL  PLAN: PT Frequency: 1x/week  PT Duration: 10 weeks  Planned Interventions: Therapeutic exercises, Therapeutic activity, Neuromuscular re-education, Balance training, Gait training, Patient/Family education, Self Care, Joint mobilization, Spinal mobilization, Cryotherapy, Moist heat, scar mobilization, Taping, and Manual therapy  Plan For Next Session: check hip, PFM external, breathing/urgency control review and handout   Raphaela Cannaday, PT, DPT  08/09/2022, 3:29 PM

## 2022-08-10 ENCOUNTER — Telehealth: Payer: Self-pay | Admitting: Urology

## 2022-08-10 ENCOUNTER — Other Ambulatory Visit: Payer: Self-pay

## 2022-08-10 NOTE — Telephone Encounter (Signed)
Incoming call on triage line from pt requesting refill on vaginal diazepam, she would like it sent to [facility] [address].

## 2022-08-10 NOTE — Telephone Encounter (Signed)
Pt stopped by and needs RX diazepam (VALIUM) 10 MG tablet [[id number]].  She would like to take this on a regular basis.  Pharmacy is [facility]. Please call pt if there are any questions. [phone].

## 2022-08-14 ENCOUNTER — Other Ambulatory Visit: Payer: Self-pay | Admitting: Urology

## 2022-08-15 NOTE — Telephone Encounter (Signed)
Patient uses valium PRN on road trips, she has been having more frequent road trips and experiencing bladder spasms.   Scheduled appointment for follow up

## 2022-08-15 NOTE — Telephone Encounter (Signed)
Valium is a controlled substance and that she will need to be seen on a more regular basis if she would like to continue this.  I am assuming that it was effective given her desire for refills.  How often is she using this medication?  I prescribed her 30 tablets in April.  Like to get a sense of how often she is using it in order to decide how often she needs to be seen.  Hollice Espy, MD

## 2022-08-16 ENCOUNTER — Ambulatory Visit: Payer: 59 | Attending: Urology

## 2022-08-16 DIAGNOSIS — R102 Pelvic and perineal pain: Secondary | ICD-10-CM | POA: Diagnosis present

## 2022-08-16 DIAGNOSIS — M6289 Other specified disorders of muscle: Secondary | ICD-10-CM | POA: Insufficient documentation

## 2022-08-16 DIAGNOSIS — R278 Other lack of coordination: Secondary | ICD-10-CM | POA: Insufficient documentation

## 2022-08-16 NOTE — Therapy (Signed)
OUTPATIENT PHYSICAL THERAPY FEMALE PELVIC TREATMENT   Patient Name: Lynn Dennis MRN: [id number] DOB:1961/07/27, 61 y.o., female Today's Date: 08/16/2022   PT End of Session - 08/16/22 1613     Visit Number 3    Number of Visits 10    Date for PT Re-Evaluation 10/11/22    Authorization Type IE: 08/02/22    PT Start Time 1615    PT Stop Time 1655    PT Time Calculation (min) 40 min    Activity Tolerance Patient tolerated treatment well             Past Medical History:  Diagnosis Date   Arthritis    neck   Cancer ([place])    squamous cell skin cancer   GERD (gastroesophageal reflux disease)    only time is when lap band is loosened   History of hiatal hernia    fixed with lap band surgery   Pneumonia 2014   Sleep apnea    uses cpap   Past Surgical History:  Procedure Laterality Date   ABDOMINAL HYSTERECTOMY     CYSTO WITH HYDRODISTENSION N/A 03/20/2018   Procedure: CYSTOSCOPY/HYDRODISTENSION;  Surgeon: Hollice Espy, MD;  Location: [facility];  Service: Urology;  Laterality: N/A;   CYSTO WITH HYDRODISTENSION N/A 04/10/2022   Procedure: CYSTOSCOPY/HYDRODISTENSION WITH BLADDER BIOPSY ;  Surgeon: Hollice Espy, MD;  Location: [facility];  Service: Urology;  Laterality: N/A;   CYSTOSCOPY WITH BIOPSY N/A 03/20/2018   Procedure: CYSTOSCOPY WITH Bladder BIOPSY;  Surgeon: Hollice Espy, MD;  Location: [facility];  Service: Urology;  Laterality: N/A;   CYSTOSCOPY WITH FULGERATION N/A 03/20/2018   Procedure: CYSTOSCOPY WITH FULGERATION;  Surgeon: Hollice Espy, MD;  Location: [facility];  Service: Urology;  Laterality: N/A;   FRACTURE SURGERY     fractured finger   HEEL SPUR SURGERY Right    HERNIA REPAIR     age 43 inguinal    KNEE ARTHROSCOPY Left    LAPAROSCOPIC GASTRIC BANDING     PANNICULECTOMY  04/2021   SHOULDER ARTHROSCOPY WITH DEBRIDEMENT AND BICEP TENDON REPAIR Right 08/12/2015   Procedure: SHOULDER ARTHROSCOPY WITH DEBRIDMENT, DECOMPRESSION, SLAP REPAIR, MINI OPEN  BICEPS TENDONESIS;  Surgeon: Corky Mull, MD;  Location: [facility];  Service: Orthopedics;  Laterality: Right;   TONSILLECTOMY     Patient Active Problem List   Diagnosis Date Noted   Panniculitis 12/31/2020   Back pain 12/31/2020   Varicose veins of both lower extremities with pain 11/06/2018   Bilateral lower extremity edema 11/06/2018    PCP: Rusty Aus, MD  REFERRING PROVIDER: Hollice Espy, MD   REFERRING DIAG:  N30.10 (ICD-10-CM) - IC (interstitial cystitis)   THERAPY DIAG:  Pelvic floor dysfunction  Other lack of coordination  Pelvic pain  Rationale for Evaluation and Treatment: Rehabilitation  ONSET DATE: 1 year ago   PRECAUTIONS: None  WEIGHT BEARING RESTRICTIONS: No  FALLS:  Has patient fallen in last 6 months? No  OCCUPATION/SOCIAL ACTIVITIES: Retired, play instruments, swimming, walking  PLOF: Independent    CHIEF CONCERN: Pt has IC and sxs started after hysterectomy. Pt thought it was the start of a UTI but it was not and nothing got better. Last year was the first time Pt experienced a bladder spasm. Pt would do her "pelvic floor" exercises that involved adductor stretch and things she learned from another PT. That triggered a bladder spasm. Pt noticed during increased physical activity one day outside and caused another bladder spasm. Pt reports the spasms  really hurt and at times she is not able to pee and she strains to push out urine. Pt cannot drive long distances because she will have increased pain in the lower abdomen and radiates towards the vagina.   NPRS scale: 10/10 (worst) Pain location: Internal, Deep, and Vaginal  Pain type: burning and sharp Pain description: intermittent, sharp, and burning   Aggravating factors: increased activity,  Relieving factors: time (10-15 mins), combination of meds    PATIENT GOALS: Pt would like to find ways to exercise/physical activity that will not trigger a bladder spasm and help reduce the  pain    UROLOGICAL HISTORY Fluid intake: non-acidic tea/no caffeine, water (cannot give answer to how many)  Pain with urination: Yes, straining  Fully empty bladder: Yes Stream: constant Urgency: Yes Frequency: At least once an hour, if not more Nocturia: 1x an hour, getting at least 8-9 hrs of sleep Leakage: No Bladder control (0-10): 2/10   GASTROINTESTINAL HISTORY Pain with bowel movement: No Fiber supplement: Yes   SEXUAL HISTORY/FUNCTION Pain with intercourse: Initial Penetration Ability to have vaginal penetration:  Yes: but with pain ; Deep thrusting: Yes Able to achieve orgasm?: Yes  OBSTETRICAL HISTORY Deferred 2/2 time constraints  Vaginal deliveries:  Tearing:  C-section deliveries:   GYNECOLOGICAL HISTORY Hysterectomy: Abdominal, yes Pelvic Organ Prolapse: None Pain with exam: no Heaviness/pressure: no   SUBJECTIVE:  Pt had two episodes of bladder spasms during this past week. The latest one happened during the night and lasted 1.5 hours. Spasms made her have a BM but she felt like she couldn't urinate. Pt assumed a bent over "crunch" position to help make the pain go away.   PAIN:  Are you having pain? No                                                                                                                                                          OBJECTIVE:   DIAGNOSTIC TESTING/IMPRESSIONS:  04/10/22 Biopsy - "Pathology benign, consistent with chronic cystitis.  Lets see you in 6 mo for recheck or sooner as needed."     COGNITION: Overall cognitive status: Within functional limits for tasks assessed     POSTURE:    Thoracic kyphosis: slight in standing, L shoulder elevated Iliac crest height: L iliac crest Pelvic obliquity: L posteriorly rotated   RANGE OF MOTION:    (Norm range in degrees)  LEFT 08/09/22 RIGHT 08/09/22  Lumbar forward flexion (65):  WNL    Lumbar extension (30): WNL    Lumbar lateral flexion (25):  Restricted WNL   Thoracic and Lumbar rotation (30 degrees):    WNL WNL  Hip Flexion (0-125):   WNL WNL  Hip IR (0-45):  WNL WNL  Hip ER (0-45):  WNL WNL  Hip Adduction:  Hip Abduction (0-40):  WNL WNL  Hip extension (0-15):     (*= pain, Blank rows = not tested)   STRENGTH: MMT    RLE 08/09/22 LLE 08/09/22  Hip Flexion 4 5  Hip Extension 4 4  Hip Abduction     Hip Adduction     Hip ER  5 4  Hip IR  5 5  Knee Extension 5 5  Knee Flexion 5 5  Dorsiflexion     Plantarflexion (seated) 5 5  (*= pain, Blank rows = not tested)   SPECIAL TESTS:  FABER (SN 81): negative  B FADIR (SN 94): negative B   PALPATION:  Abdominal:  Diastasis: none Scar mobility: present from L ASIS to R ASIS right above pubic bone, significant scar restriction along scar and above   -Tension throughout abdominal cavity more significant in center of upper quadrant/center of abdomen/LLQ and RLQ   TODAY'S TREATMENT:  Neuromuscular Re-education:  Pre-treatment assessment  EXTERNAL PELVIC EXAM: Patient educated on the purpose of the pelvic exam and articulated understanding; patient consented to the exam verbally.  Breath coordination: present but inconsistent  Voluntary Contraction: present, 2/5  Relaxation: full Perineal movement with sustained IAP increase ("bear down"): no change then felt elevation, Valsalva maneuver  Perineal movement with rapid IAP increase ("cough"): no change (0= no contraction, 1= flicker, 2= weak squeeze, 3= fair squeeze with lift, 4= good squeeze and lift against resistance, 5= strong squeeze against strong resistance)   Supine hooklying diaphragmatic breathing with VCs and TCs for downregulation of the nervous system and improved management of IAP   Supine hooklying PFM lengthening techniques with diaphragmatic breathing, VCs and TCs as needed              B Single knee to chest   Double knee to chest              "Happy baby" pose   Butterfly pose "adductor stretch"   Child's  pose  Cat/cow    Patient response to interventions: Pt enjoyed "happy baby" pose the most    Patient Education:  Patient provided with HEP including: supine diaphragmatic breathing and above PFM lengthening techniques. Patient educated throughout session on appropriate technique and form using multi-modal cueing, HEP, and activity modification. Patient will benefit from further education in order to maximize compliance and understanding for long-term therapeutic gains.    ASSESSMENT:  Clinical Impression: Patient presents to clinic with excellent motivation to participate in today's session. Pt continues to demonstrate deficits in LE strength, pain, scar mobility, posture, sleep hygiene, and ROM. Upon PFM external exam, Pt also demonstrates deficits in PFM coordination, PFM strength, and PFM extensibility as evidenced by present but inconsistent breath coordination, 2/5 MMT PFM strength with gluteal activation, and no change then elevation of PFM during sustained IAP increase ("bear down") with Valsalva. Pt required moderate VCs and TCs for PFM lengthening positions and remembering to breathe in various positions. Brief discussion on pain neuroscience and upregulation of sympathetic nervous system during an IC flare-up. Pt responded positively to active and educational interventions. Patient will continue to benefit from skilled therapeutic intervention to address deficits in PFM coordination, PFM strength, PFM extensibility, LE strength, pain, scar mobility, posture, sleep hygiene, and ROM in order to increase PLOF and improve overall QOL.    Objective Impairments: decreased coordination, decreased endurance, decreased strength, increased fascial restrictions, increased muscle spasms, improper body mechanics, postural dysfunction, and pain.   Activity Limitations: lifting, bending, standing, toileting, and locomotion  level  Personal Factors: Age, Behavior pattern, Past/current experiences,  Time since onset of injury/illness/exacerbation, and 1-2 comorbidities: sleep apnea and GERD  are also affecting patient's functional outcome.   Rehab Potential: Good  Clinical Decision Making: Evolving/moderate complexity  Evaluation Complexity: Moderate   GOALS: Goals reviewed with patient? Yes  SHORT TERM GOALS: Target date: 09/07/2022  Patient will demonstrate independence with HEP in order to maximize therapeutic gains and improve carryover from physical therapy sessions to ADLs in the home and community. Baseline: toileting posture/bladder irritants handout Goal status: INITIAL   LONG TERM GOALS: Target date: 10/11/2022   Patient will score  >/= 70 on FOTO Urinary Problem in order to demonstrate improved PFM coordination, improved PFM strength, and overall improved QOL.  Baseline: 67 Goal status: INITIAL  2.  Patient will report confidence in ability to control bladder > 6/10 in order to demonstrate improved function and ability to participate more fully in activities at home and in the community. Baseline: 2/10 Goal status: INITIAL  3.  Patient will report a decrease in voiding intervals during the night in order to demonstrate improved control of the bladder, PFM coordination, sleep quality, and overall QOL.  Baseline: at least 1x/hour Goal status: INITIAL  4.  Patient will decrease worst pain as reported on NPRS by at least 2 points to demonstrate clinically significant reduction in pain in order to restore/improve function and overall QOL. Baseline: 10/10 pelvic pain (bladder spasm) Goal status: INITIAL  5.  Patient will demonstrate coordinated lengthening and relaxation of PFM with diaphragmatic inhalation in order to decrease spasm and allow for unrestricted elimination of urine/feces for improved overall QOL. Baseline: has to strain to urinate at times/will assess next visit; (9/6): present but inconsistent breath Goal status: INITIAL  6.  Patient will  demonstrate understanding of basic self-management/down-regulation of the nervous system for persistent pain condition and stress as evidenced by diaphragmatic breathing without cueing, body scan/progressive relaxation meditation, and improved sleep hygiene including: minimal disruption to void during the night in order to transition to independent management of patient's chief concern: interstitial cystitis.  Baseline: will continue to assess in following visits Goal status: INITIAL  PLAN: PT Frequency: 1x/week  PT Duration: 10 weeks  Planned Interventions: Therapeutic exercises, Therapeutic activity, Neuromuscular re-education, Balance training, Gait training, Patient/Family education, Self Care, Joint mobilization, Spinal mobilization, Cryotherapy, Moist heat, scar mobilization, Taping, and Manual therapy  Plan For Next Session:  check hip, breathing/urgency control review and handout, any spasms this week?   Kathrynne Kulinski, PT, DPT  08/16/2022, 4:13 PM

## 2022-08-17 MED ORDER — DIAZEPAM 10 MG PO TABS
10.0000 mg | ORAL_TABLET | Freq: Every day | ORAL | 0 refills | Status: AC | PRN
Start: 2022-08-17 — End: ?

## 2022-08-17 NOTE — Telephone Encounter (Signed)
Patient informed, voiced understanding.  °

## 2022-08-17 NOTE — Addendum Note (Signed)
Addended by: Hollice Espy on: 08/17/2022 12:31 PM   Modules accepted: Orders

## 2022-08-17 NOTE — Telephone Encounter (Signed)
I went ahead and refilled dispense #[id number] and will need to see her on a q. 73-monthbasis as long as she would like to continue this medication.  AHollice Espy MD

## 2022-08-23 ENCOUNTER — Ambulatory Visit: Payer: 59

## 2022-08-23 DIAGNOSIS — M6289 Other specified disorders of muscle: Secondary | ICD-10-CM | POA: Diagnosis not present

## 2022-08-23 DIAGNOSIS — R278 Other lack of coordination: Secondary | ICD-10-CM

## 2022-08-23 DIAGNOSIS — R102 Pelvic and perineal pain: Secondary | ICD-10-CM

## 2022-08-23 NOTE — Therapy (Signed)
OUTPATIENT PHYSICAL THERAPY FEMALE PELVIC TREATMENT   Patient Name: Lynn Dennis MRN: [id number] DOB:[birth year], 61 y.o., female Today's Date: 08/23/2022   PT End of Session - 08/23/22 1527     Visit Number 4    Number of Visits 10    Date for PT Re-Evaluation 10/11/22    Authorization Type IE: 08/02/22    PT Start Time 1530    PT Stop Time 1610    PT Time Calculation (min) 40 min    Activity Tolerance Patient tolerated treatment well             Past Medical History:  Diagnosis Date   Arthritis    neck   Cancer ([place])    squamous cell skin cancer   GERD (gastroesophageal reflux disease)    only time is when lap band is loosened   History of hiatal hernia    fixed with lap band surgery   Pneumonia 2014   Sleep apnea    uses cpap   Past Surgical History:  Procedure Laterality Date   ABDOMINAL HYSTERECTOMY     CYSTO WITH HYDRODISTENSION N/A 03/20/2018   Procedure: CYSTOSCOPY/HYDRODISTENSION;  Surgeon: Hollice Espy, MD;  Location: [facility];  Service: Urology;  Laterality: N/A;   CYSTO WITH HYDRODISTENSION N/A 04/10/2022   Procedure: CYSTOSCOPY/HYDRODISTENSION WITH BLADDER BIOPSY ;  Surgeon: Hollice Espy, MD;  Location: [facility];  Service: Urology;  Laterality: N/A;   CYSTOSCOPY WITH BIOPSY N/A 03/20/2018   Procedure: CYSTOSCOPY WITH Bladder BIOPSY;  Surgeon: Hollice Espy, MD;  Location: [facility];  Service: Urology;  Laterality: N/A;   CYSTOSCOPY WITH FULGERATION N/A 03/20/2018   Procedure: CYSTOSCOPY WITH FULGERATION;  Surgeon: Hollice Espy, MD;  Location: [facility];  Service: Urology;  Laterality: N/A;   FRACTURE SURGERY     fractured finger   HEEL SPUR SURGERY Right    HERNIA REPAIR     age 25 inguinal    KNEE ARTHROSCOPY Left    LAPAROSCOPIC GASTRIC BANDING     PANNICULECTOMY  04/2021   SHOULDER ARTHROSCOPY WITH DEBRIDEMENT AND BICEP TENDON REPAIR Right 08/12/2015   Procedure: SHOULDER ARTHROSCOPY WITH DEBRIDMENT, DECOMPRESSION, SLAP REPAIR, MINI OPEN  BICEPS TENDONESIS;  Surgeon: Corky Mull, MD;  Location: [facility];  Service: Orthopedics;  Laterality: Right;   TONSILLECTOMY     Patient Active Problem List   Diagnosis Date Noted   Panniculitis 12/31/2020   Back pain 12/31/2020   Varicose veins of both lower extremities with pain 11/06/2018   Bilateral lower extremity edema 11/06/2018    PCP: Rusty Aus, MD  REFERRING PROVIDER: Hollice Espy, MD   REFERRING DIAG:  N30.10 (ICD-10-CM) - IC (interstitial cystitis)   THERAPY DIAG:  Pelvic floor dysfunction  Other lack of coordination  Pelvic pain  Rationale for Evaluation and Treatment: Rehabilitation  ONSET DATE: 1 year ago   PRECAUTIONS: None  WEIGHT BEARING RESTRICTIONS: No  FALLS:  Has patient fallen in last 6 months? No  OCCUPATION/SOCIAL ACTIVITIES: Retired, play instruments, swimming, walking  PLOF: Independent    CHIEF CONCERN: Pt has IC and sxs started after hysterectomy. Pt thought it was the start of a UTI but it was not and nothing got better. Last year was the first time Pt experienced a bladder spasm. Pt would do her "pelvic floor" exercises that involved adductor stretch and things she learned from another PT. That triggered a bladder spasm. Pt noticed during increased physical activity one day outside and caused another bladder spasm. Pt reports the spasms  really hurt and at times she is not able to pee and she strains to push out urine. Pt cannot drive long distances because she will have increased pain in the lower abdomen and radiates towards the vagina.   NPRS scale: 10/10 (worst) Pain location: Internal, Deep, and Vaginal  Pain type: burning and sharp Pain description: intermittent, sharp, and burning   Aggravating factors: increased activity,  Relieving factors: time (10-15 mins), combination of meds    PATIENT GOALS: Pt would like to find ways to exercise/physical activity that will not trigger a bladder spasm and help reduce the  pain    UROLOGICAL HISTORY Fluid intake: non-acidic tea/no caffeine, water (cannot give answer to how many)  Pain with urination: Yes, straining  Fully empty bladder: Yes Stream: constant Urgency: Yes Frequency: At least once an hour, if not more Nocturia: 1x an hour, getting at least 8-9 hrs of sleep Leakage: No Bladder control (0-10): 2/10   GASTROINTESTINAL HISTORY Pain with bowel movement: No Fiber supplement: Yes   SEXUAL HISTORY/FUNCTION Pain with intercourse: Initial Penetration Ability to have vaginal penetration:  Yes: but with pain ; Deep thrusting: Yes Able to achieve orgasm?: Yes  OBSTETRICAL HISTORY Deferred 2/2 time constraints  Vaginal deliveries:  Tearing:  C-section deliveries:   GYNECOLOGICAL HISTORY Hysterectomy: Abdominal, yes Pelvic Organ Prolapse: None Pain with exam: no Heaviness/pressure: no   SUBJECTIVE:  Pt reports no spasms this week. Pt is practicing HEP but not as much as she should.   PAIN:  Are you having pain? No                                                                                                                                                          OBJECTIVE:   DIAGNOSTIC TESTING/IMPRESSIONS:  04/10/22 Biopsy - "Pathology benign, consistent with chronic cystitis.  Lets see you in 6 mo for recheck or sooner as needed."     COGNITION: Overall cognitive status: Within functional limits for tasks assessed     POSTURE:    Thoracic kyphosis: slight in standing, L shoulder elevated Iliac crest height: L iliac crest Pelvic obliquity: L posteriorly rotated   RANGE OF MOTION:    (Norm range in degrees)  LEFT 08/09/22 RIGHT 08/09/22  Lumbar forward flexion (65):  WNL    Lumbar extension (30): WNL    Lumbar lateral flexion (25):  Restricted WNL  Thoracic and Lumbar rotation (30 degrees):    WNL WNL  Hip Flexion (0-125):   WNL WNL  Hip IR (0-45):  WNL WNL  Hip ER (0-45):  WNL WNL  Hip Adduction:      Hip Abduction  (0-40):  WNL WNL  Hip extension (0-15):     (*= pain, Blank rows = not tested)   STRENGTH: MMT    RLE 08/09/22  LLE 08/09/22  Hip Flexion 4 5  Hip Extension 4 4  Hip Abduction     Hip Adduction     Hip ER  5 4  Hip IR  5 5  Knee Extension 5 5  Knee Flexion 5 5  Dorsiflexion     Plantarflexion (seated) 5 5  (*= pain, Blank rows = not tested)   SPECIAL TESTS:  FABER (SN 81): negative  B FADIR (SN 94): negative B   PALPATION:  Abdominal:  Diastasis: none Scar mobility: present from L ASIS to R ASIS right above pubic bone, significant scar restriction along scar and above   -Tension throughout abdominal cavity more significant in center of upper quadrant/center of abdomen/LLQ and RLQ  EXTERNAL PELVIC EXAM: Patient educated on the purpose of the pelvic exam and articulated understanding; patient consented to the exam verbally.  Breath coordination: present but inconsistent  Voluntary Contraction: present, 2/5  Relaxation: full Perineal movement with sustained IAP increase ("bear down"): no change then felt elevation, Valsalva maneuver  Perineal movement with rapid IAP increase ("cough"): no change (0= no contraction, 1= flicker, 2= weak squeeze, 3= fair squeeze with lift, 4= good squeeze and lift against resistance, 5= strong squeeze against strong resistance)    TODAY'S TREATMENT:   Neuromuscular Re-education: Discussion on posture in sitting in particularly placing COG over ischial tuberosities and posterior thigh musculature. Pt verbalized understanding.   Lengthy discussion on urinary urgency and frequency in relation to the bladder brain connection (Bradley's Loops) Pt given handout and verbalized understanding   Supine hooklying diaphragmatic breathing with VCs and TCs for downregulation of the nervous system and improved management of IAP  Supine hooklying PFM lengthening techniques with diaphragmatic breathing, VCs and TCs as needed              B Single knee to  chest              "Happy baby" pose   Butterfly pose "adductor stretch"   Child's pose  Patient response to interventions: Pt presents with some hesitation on urinary urgency/frequency change, but is willing to try    Patient Education:  Patient provided with no change to HEP. Patient educated throughout session on appropriate technique and form using multi-modal cueing, HEP, and activity modification. Patient will benefit from further education in order to maximize compliance and understanding for long-term therapeutic gains.    ASSESSMENT:  Clinical Impression: Patient presents to clinic with excellent motivation to participate in today's session. Pt continues to demonstrate deficits in LE strength, pain, scar mobility, posture, sleep hygiene, ROM, PFM coordination, PFM strength, and PFM extensibility. Lengthy discssion on urinary urgency and frequency in relation to bladder-brain connection (Bradley's Loops). In addition, discussion on how to control urinary urgency especially during the night as Pt reports a burning sensation felt. Urgency control handout given. Pt required minimal VCs and TCs reviewing PFM lengthening techniques. Pt responded positively to active and educational interventions. Patient will continue to benefit from skilled therapeutic intervention to address deficits in PFM coordination, PFM strength, PFM extensibility, LE strength, pain, scar mobility, posture, sleep hygiene, and ROM in order to increase PLOF and improve overall QOL.    Objective Impairments: decreased coordination, decreased endurance, decreased strength, increased fascial restrictions, increased muscle spasms, improper body mechanics, postural dysfunction, and pain.   Activity Limitations: lifting, bending, standing, toileting, and locomotion level  Personal Factors: Age, Behavior pattern, Past/current experiences, Time since onset of injury/illness/exacerbation, and 1-2 comorbidities: sleep apnea and  GERD  are also affecting patient's functional outcome.   Rehab Potential: Good  Clinical Decision Making: Evolving/moderate complexity  Evaluation Complexity: Moderate   GOALS: Goals reviewed with patient? Yes  SHORT TERM GOALS: Target date: 09/07/2022  Patient will demonstrate independence with HEP in order to maximize therapeutic gains and improve carryover from physical therapy sessions to ADLs in the home and community. Baseline: toileting posture/bladder irritants handout Goal status: INITIAL   LONG TERM GOALS: Target date: 10/11/2022   Patient will score  >/= 70 on FOTO Urinary Problem in order to demonstrate improved PFM coordination, improved PFM strength, and overall improved QOL.  Baseline: 67 Goal status: INITIAL  2.  Patient will report confidence in ability to control bladder > 6/10 in order to demonstrate improved function and ability to participate more fully in activities at home and in the community. Baseline: 2/10 Goal status: INITIAL  3.  Patient will report a decrease in voiding intervals during the night in order to demonstrate improved control of the bladder, PFM coordination, sleep quality, and overall QOL.  Baseline: at least 1x/hour Goal status: INITIAL  4.  Patient will decrease worst pain as reported on NPRS by at least 2 points to demonstrate clinically significant reduction in pain in order to restore/improve function and overall QOL. Baseline: 10/10 pelvic pain (bladder spasm) Goal status: INITIAL  5.  Patient will demonstrate coordinated lengthening and relaxation of PFM with diaphragmatic inhalation in order to decrease spasm and allow for unrestricted elimination of urine/feces for improved overall QOL. Baseline: has to strain to urinate at times/will assess next visit; (9/6): present but inconsistent breath Goal status: INITIAL  6.  Patient will demonstrate understanding of basic self-management/down-regulation of the nervous system for  persistent pain condition and stress as evidenced by diaphragmatic breathing without cueing, body scan/progressive relaxation meditation, and improved sleep hygiene including: minimal disruption to void during the night in order to transition to independent management of patient's chief concern: interstitial cystitis.  Baseline: will continue to assess in following visits Goal status: INITIAL  PLAN: PT Frequency: 1x/week  PT Duration: 10 weeks  Planned Interventions: Therapeutic exercises, Therapeutic activity, Neuromuscular re-education, Balance training, Gait training, Patient/Family education, Self Care, Joint mobilization, Spinal mobilization, Cryotherapy, Moist heat, scar mobilization, Taping, and Manual therapy  Plan For Next Session:  check hip/provide manual, MET manual, more lengthening techniques, body scan?    Aairah Negrette, PT, DPT  08/23/2022, 3:27 PM

## 2022-08-24 ENCOUNTER — Ambulatory Visit: Payer: 59 | Admitting: Urology

## 2022-08-24 DIAGNOSIS — N3010 Interstitial cystitis (chronic) without hematuria: Secondary | ICD-10-CM

## 2022-08-24 MED ORDER — GEMTESA 75 MG PO TABS: 75.0000 mg | ORAL_TABLET | Freq: Every day | ORAL | 0 refills | Status: AC

## 2022-08-24 MED ORDER — OXYBUTYNIN CHLORIDE 5 MG PO TABS: 5.0000 mg | ORAL_TABLET | Freq: Three times a day (TID) | ORAL | 11 refills | Status: AC | PRN

## 2022-08-24 NOTE — Progress Notes (Signed)
08/24/2022 10:58 AM   Lynn Ingles Comern­o Dennis Apr 27, 1961 [id number]  Referring provider: Rusty Aus, MD [facility] [place],  [place] [address]  Chief Complaint  Patient presents with   Follow-up    HPI: 61 year old female with a personal history of IC, microscopic hematuria and lateral bladder wall thickening status post biopsy returns today for follow-up.  She reports that after this hydrodistention, her symptoms only abated for about a month.  Last time, she did well for over a year.  She is worried today about the loss of bladder capacity.  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  At last visit, she wanted to try vaginal Valium for pelvic pain and irritation especially exacerbated by car rides.  She recently called for refill of this and appears to be effective.  She reassured me that she is never the driver when she uses this medication.  She is also been working with physical therapy.  She had a few sessions and also not sure if they are helping.  She is looking for a physical therapist that might be able to do trigger point release or internal therapy.  Her primary complaint today is bladder spasms.  When they occur, they are severe.  She will often have accompanying rectal and vaginal spasms as well.  When these happen, she will either take a pain pill, vaginal Valium, or the as needed oxybutynin.  In the past, she tried lots of different medications which she brings expired bottles with her today.  This includes hydroxyzine, BuSpar, expired bottles of oxybutynin, etc.  She has made appointment with Dr. Amalia Hailey which I agree with.  She is given up coffee and really worked on dietary changes.   PMH: Past Medical History:  Diagnosis Date   Arthritis    neck   Cancer ([place])    squamous cell skin cancer   GERD (gastroesophageal reflux disease)    only time is when lap band is loosened   History of  hiatal hernia    fixed with lap band surgery   Pneumonia 2014   Sleep apnea    uses cpap    Surgical History: Past Surgical History:  Procedure Laterality Date   ABDOMINAL HYSTERECTOMY     CYSTO WITH HYDRODISTENSION N/A 03/20/2018   Procedure: CYSTOSCOPY/HYDRODISTENSION;  Surgeon: Hollice Espy, MD;  Location: [facility];  Service: Urology;  Laterality: N/A;   CYSTO WITH HYDRODISTENSION N/A 04/10/2022   Procedure: CYSTOSCOPY/HYDRODISTENSION WITH BLADDER BIOPSY ;  Surgeon: Hollice Espy, MD;  Location: [facility];  Service: Urology;  Laterality: N/A;   CYSTOSCOPY WITH BIOPSY N/A 03/20/2018   Procedure: CYSTOSCOPY WITH Bladder BIOPSY;  Surgeon: Hollice Espy, MD;  Location: [facility];  Service: Urology;  Laterality: N/A;   CYSTOSCOPY WITH FULGERATION N/A 03/20/2018   Procedure: CYSTOSCOPY WITH FULGERATION;  Surgeon: Hollice Espy, MD;  Location: [facility];  Service: Urology;  Laterality: N/A;   FRACTURE SURGERY     fractured finger   HEEL SPUR SURGERY Right    HERNIA REPAIR     age 59 inguinal    KNEE ARTHROSCOPY Left    LAPAROSCOPIC GASTRIC BANDING     PANNICULECTOMY  04/2021   SHOULDER ARTHROSCOPY WITH DEBRIDEMENT AND BICEP TENDON REPAIR Right 08/12/2015   Procedure: SHOULDER ARTHROSCOPY WITH DEBRIDMENT, DECOMPRESSION, SLAP REPAIR, MINI OPEN BICEPS TENDONESIS;  Surgeon: Corky Mull, MD;  Location: [facility];  Service: Orthopedics;  Laterality: Right;   TONSILLECTOMY  Home Medications:  Allergies as of 08/24/2022       Reactions   Prednisone Other (See Comments)   hallucinantions   Adhesive [tape] Other (See Comments)   Skin discomfort with extended use   Gluten Meal    Gluten intolerant   Lactose Intolerance (gi) Other (See Comments)   Gi upset   Nsaids Other (See Comments)   Patient has lap band        Medication List        Accurate as of August 24, 2022 10:58 AM. If you have any questions, ask your nurse or doctor.          b complex vitamins  capsule Take 1 capsule by mouth daily.   carboxymethylcellulose 0.5 % Soln Commonly known as: REFRESH PLUS Place 1 drop into both eyes at bedtime.   cholecalciferol 25 MCG (1000 UNIT) tablet Commonly known as: VITAMIN D3 Take 1,000 Units by mouth daily.   diazepam 10 MG tablet Commonly known as: VALIUM Take 1 tablet (10 mg total) by mouth daily as needed (cystitis).   escitalopram 5 MG tablet Commonly known as: LEXAPRO Take 5 mg by mouth every evening.   estradiol 1 MG tablet Commonly known as: ESTRACE Take 1 mg by mouth daily.   GNP GINGKO BILOBA EXTRACT PO Take 500 mg by mouth daily.   hydrOXYzine 10 MG tablet Commonly known as: ATARAX Take 10 mg by mouth daily as needed (cystitis).   meloxicam 7.5 MG tablet Commonly known as: MOBIC Take 7.5 mg by mouth daily as needed for pain.   metroNIDAZOLE 1 % gel Commonly known as: METROGEL Apply 1 application  topically daily.   oxybutynin 5 MG tablet Commonly known as: DITROPAN Take 5 mg by mouth every 8 (eight) hours as needed for bladder spasms.   psyllium 0.52 g capsule Commonly known as: REGULOID Take 0.52 g by mouth daily.   triamcinolone cream 0.1 % Commonly known as: KENALOG Apply 1 application  topically daily as needed (rash).        Allergies:  Allergies  Allergen Reactions   Prednisone Other (See Comments)    hallucinantions   Adhesive [Tape] Other (See Comments)    Skin discomfort with extended use   Gluten Meal     Gluten intolerant   Lactose Intolerance (Gi) Other (See Comments)    Gi upset   Nsaids Other (See Comments)    Patient has lap band    Family History: Family History  Problem Relation Age of Onset   Breast cancer Neg Hx    Bladder Cancer Neg Hx    Kidney cancer Neg Hx     Social History:  reports that she has never smoked. She has never used smokeless tobacco. She reports current alcohol use. She reports that she does not use drugs.   Physical Exam: There were no vitals  taken for this visit.  Constitutional:  Alert and oriented, No acute distress. HEENT: [place] AT, moist mucus membranes.  Trachea midline, no masses. Cardiovascular: No clubbing, cyanosis, or edema. Respiratory: Normal respiratory effort, no increased work of breathing. GI: Abdomen is soft, nontender, nondistended, no abdominal masses GU: No CVA tenderness Skin: No rashes, bruises or suspicious lesions. Neurologic: Grossly intact, no focal deficits, moving all 4 extremities. Psychiatric: Normal mood and affect.  Laboratory Data: Lab Results  Component Value Date   WBC 10.4 12/28/2014   HGB 14.8 12/28/2014   HCT 43.9 12/28/2014   MCV 97 12/28/2014   PLT 225 12/28/2014  Lab Results  Component Value Date   CREATININE 0.60 03/31/2022     Assessment & Plan:    1. IC (interstitial cystitis) Worsening less well controlled I see with evidence of diffuse urothelial weeping at the time of hydrodistention as well as evidence of loss of bladder capacity  At this point time given her increased intensity and frequency of bladder spasms, it may behoove her to be on antispasmodic daily rather than as needed dosing.  She has not tolerated anticholinergics particularly well in the past due to dry eyes dry mouth and constipation.  She was given samples of Gemtesa 75 mg today to try for the next month.  She can still use her oxybutynin as breakthrough if she has spasms despite the [place].  In addition to the above, we have agreed to use vaginal Valium on a rescue basis which will primarily reserved for road trips and other triggering events.  Agree to continue work with physical therapy, can reach out to [facility] to find out whether they do trigger point release.  Lastly, agree with evaluation by Dr. Amalia Hailey who specializes in [place] given her worsening symptoms.   Follow-up in 6 months for recheck and PVR - oxybutynin (DITROPAN) 5 MG tablet; Take 1 tablet (5 mg total) by mouth every 8 (eight) hours  as needed for bladder spasms.  Dispense: 30 tablet; Refill: [place], MD  [facility] [address], [place], [place] [address] [phone]

## 2022-08-28 NOTE — Therapy (Signed)
[place] MAIN [facility] SERVICES [address] [place], [place], [address] Phone: [phone]   Fax:  [phone]  August 28, 2022   No Recipients  Physical Therapy Discharge Summary  Patient: Lynn Dennis  MRN: [id number]  Date of Birth: [birth year]   Diagnosis: No diagnosis found. No data recorded  The above patient had been seen in Physical Therapy 4 times of 10 treatments scheduled with 0 no shows and 0 cancellations.  The treatment consisted of PFM coordination, PFM extensibility/lengthening techniques, pain, and abdominal myofascial tension release. The patient is: Unchanged  Subjective: Pt has IC and sxs started after hysterectomy. Pt thought it was the start of a UTI but it was not and nothing got better. Last year was the first time Pt experienced a bladder spasm. Pt would do her "pelvic floor" exercises that involved adductor stretch and things she learned from another PT. That triggered a bladder spasm. Pt noticed during increased physical activity one day outside and caused another bladder spasm. Pt reports the spasms really hurt and at times she is not able to pee and she strains to push out urine. Pt cannot drive long distances because she will have increased pain in the lower abdomen and radiates towards the vagina.   Discharge Findings: Self-discharged   Functional Status at Discharge:   No Goals Met  GOALS: Goals reviewed with patient? Yes  SHORT TERM GOALS: Target date: 09/07/2022  Patient will demonstrate independence with HEP in order to maximize therapeutic gains and improve carryover from physical therapy sessions to ADLs in the home and community. Baseline: toileting posture/bladder irritants handout Goal status: INITIAL   LONG TERM GOALS: Target date: 10/11/2022   Patient will score  >/= 70 on FOTO Urinary Problem in order to demonstrate improved PFM coordination, improved PFM strength, and overall improved QOL.   Baseline: 67 Goal status: INITIAL  2.  Patient will report confidence in ability to control bladder > 6/10 in order to demonstrate improved function and ability to participate more fully in activities at home and in the community. Baseline: 2/10 Goal status: INITIAL  3.  Patient will report a decrease in voiding intervals during the night in order to demonstrate improved control of the bladder, PFM coordination, sleep quality, and overall QOL.  Baseline: at least 1x/hour Goal status: INITIAL  4.  Patient will decrease worst pain as reported on NPRS by at least 2 points to demonstrate clinically significant reduction in pain in order to restore/improve function and overall QOL. Baseline: 10/10 pelvic pain (bladder spasm) Goal status: INITIAL  5.  Patient will demonstrate coordinated lengthening and relaxation of PFM with diaphragmatic inhalation in order to decrease spasm and allow for unrestricted elimination of urine/feces for improved overall QOL. Baseline: has to strain to urinate at times/will assess next visit; (9/6): present but inconsistent breath Goal status: INITIAL  6.  Patient will demonstrate understanding of basic self-management/down-regulation of the nervous system for persistent pain condition and stress as evidenced by diaphragmatic breathing without cueing, body scan/progressive relaxation meditation, and improved sleep hygiene including: minimal disruption to void during the night in order to transition to independent management of patient's chief concern: interstitial cystitis.  Baseline: will continue to assess in following visits Goal status: INITIAL   Sincerely,   Darryl Lent, PT   CC No Recipients  [place] [address] [place], [place], [address] Phone: [phone]   Fax:  [phone]  Patient:  Lynn Dennis  MRN: [id number]  Date of Birth: 06-21-1961

## 2022-08-30 ENCOUNTER — Ambulatory Visit: Payer: 59

## 2022-10-11 ENCOUNTER — Ambulatory Visit: Payer: 59 | Admitting: Urology

## 2022-11-21 ENCOUNTER — Telehealth: Payer: Self-pay | Admitting: Urology

## 2022-11-21 NOTE — Telephone Encounter (Signed)
Pt stopped by this morning and got a notification that she could be selected for [employer] starting in January 2024.  She would like to know if she could get a letter from you to get out of [employer], because of her Cystitis.  Please advise Oleva [phone].  Thank you

## 2022-11-21 NOTE — Telephone Encounter (Signed)
Spoke with patient and advised results, pt has not been chosen yet, she has to call the night before to see if she has to report in. I asked her to let us know if she's chosen and we will provide the letter

## 2022-11-21 NOTE — Telephone Encounter (Signed)
That is not appropriate medical standpoint..  If she needs a letter to the judge saying that she needs to be excused for more frequent bathroom breaks, I am happy to provide that.    Hollice Espy, MD

## 2023-02-21 ENCOUNTER — Ambulatory Visit: Payer: 59 | Admitting: Urology

## 2023-08-09 ENCOUNTER — Other Ambulatory Visit: Payer: Self-pay | Admitting: Student

## 2023-08-09 DIAGNOSIS — S83282A Other tear of lateral meniscus, current injury, left knee, initial encounter: Secondary | ICD-10-CM

## 2023-08-09 DIAGNOSIS — Z9889 Other specified postprocedural states: Secondary | ICD-10-CM

## 2023-08-09 DIAGNOSIS — M25562 Pain in left knee: Secondary | ICD-10-CM

## 2023-08-17 ENCOUNTER — Ambulatory Visit
Admission: RE | Admit: 2023-08-17 | Discharge: 2023-08-17 | Disposition: A | Discharge status: Home / Self Care | Payer: 59 | Source: Ambulatory Visit | Attending: Student | Admitting: Student

## 2023-08-17 DIAGNOSIS — Z9889 Other specified postprocedural states: Secondary | ICD-10-CM

## 2023-08-17 DIAGNOSIS — M25562 Pain in left knee: Secondary | ICD-10-CM

## 2023-08-17 DIAGNOSIS — S83282A Other tear of lateral meniscus, current injury, left knee, initial encounter: Secondary | ICD-10-CM

## 2023-08-28 ENCOUNTER — Other Ambulatory Visit: Payer: 59

## 2023-11-23 IMAGING — CT CT ABD-PEL WO/W CM
3 of 12 series · 11 of 46 positions shown, 17 images · IV contrast (agent unspecified)
Comparison: No relevant priors available for comparison at time
dictation.

CLINICAL DATA: Two weeks of hematuria.  History of cystitis.

EXAM:
CT ABDOMEN AND PELVIS WITHOUT AND WITH CONTRAST
TECHNIQUE: Multidetector CT imaging of the abdomen and pelvis was performed
following the standard protocol before and following the bolus
administration of intravenous contrast.

[Series 2: axial pre · axial · non-contrast · 0.75mm/px · z∈[-428,-368]mm · 2 of 83 slices shown]
[im 12/83  soft-tissue]
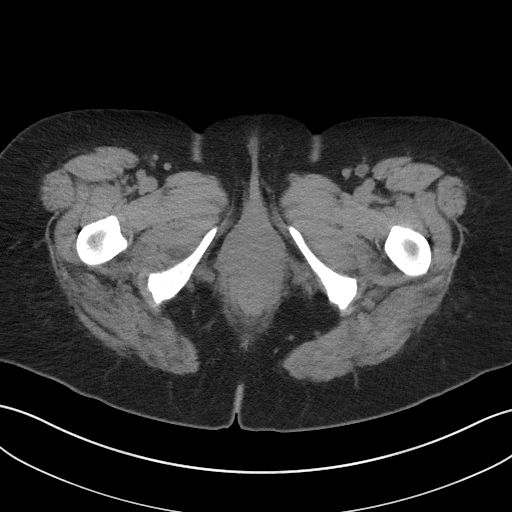
[im 24/83  soft-tissue]
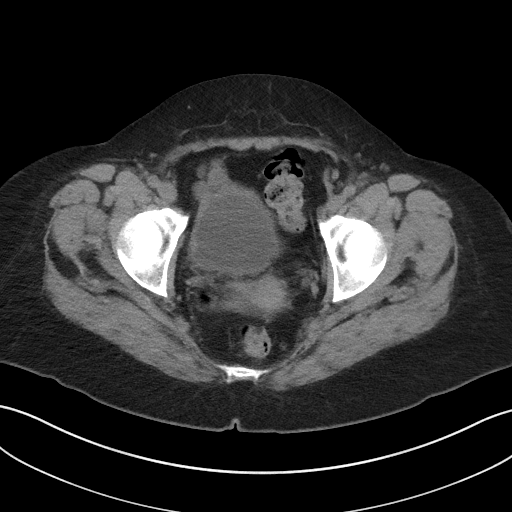

[Series 5: coronal pre · coronal · non-contrast · 0.81mm/px · 2 of 104 slices shown, 3 images]
[im 35/104  soft-tissue]
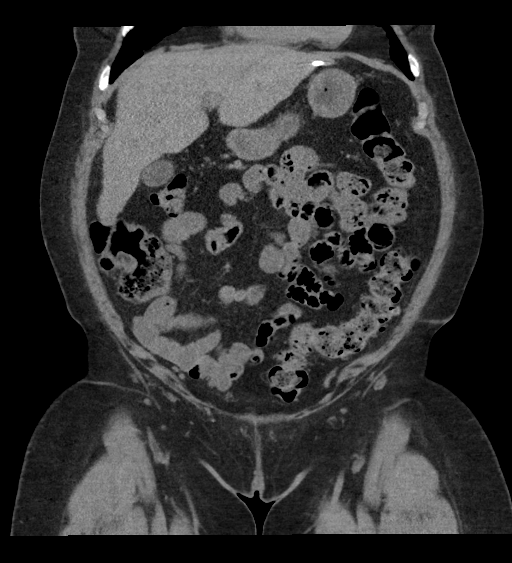
[im 35/104  bone]
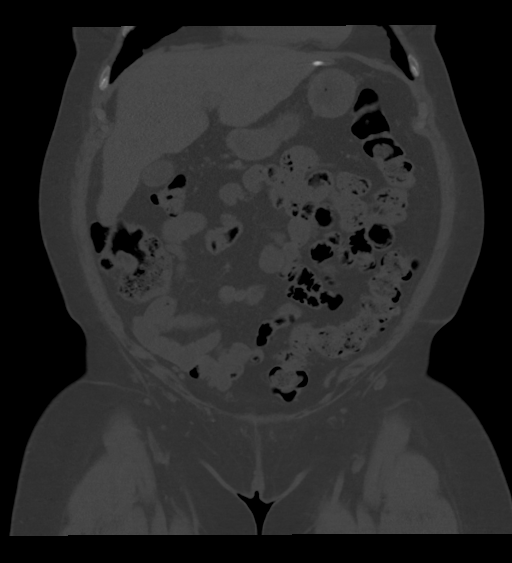
[im 69/104  soft-tissue]
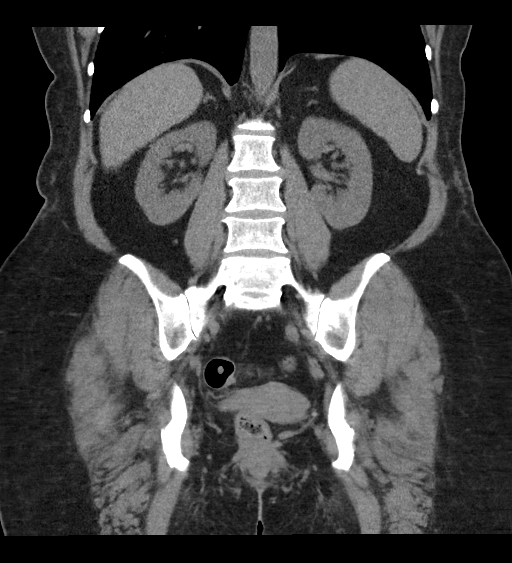

[Series 13: axial delay · axial · delayed · 0.86mm/px · z∈[-322,+8]mm · 7 of 89 slices shown, 12 images]
[im 12/89  soft-tissue]
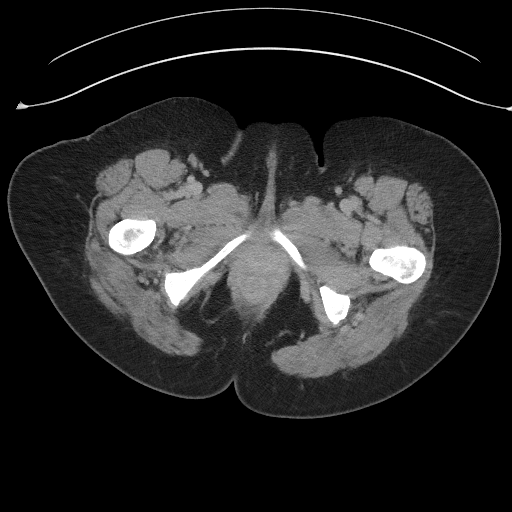
[im 12/89  bone]
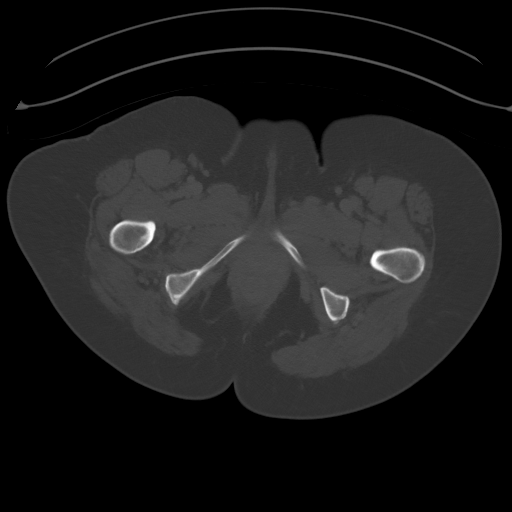
[im 23/89  soft-tissue]
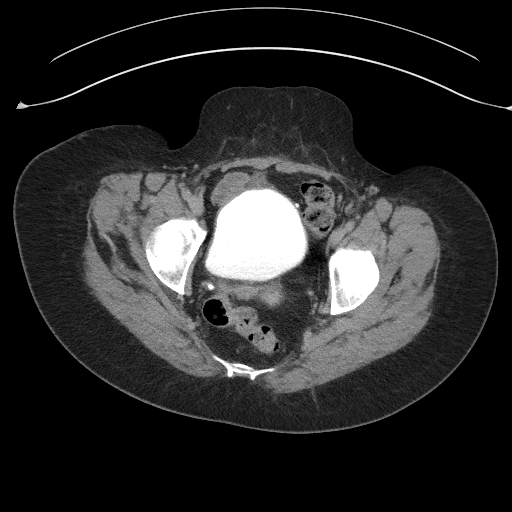
[im 34/89  soft-tissue]
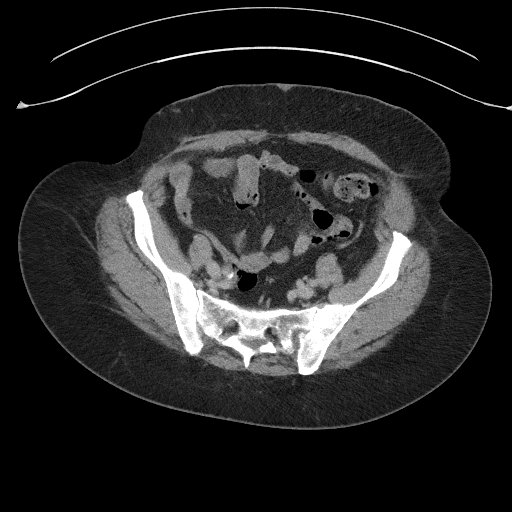
[im 45/89  soft-tissue]
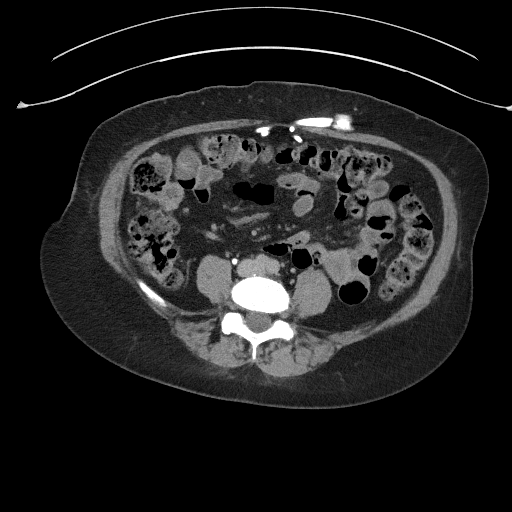
[im 45/89  lung]
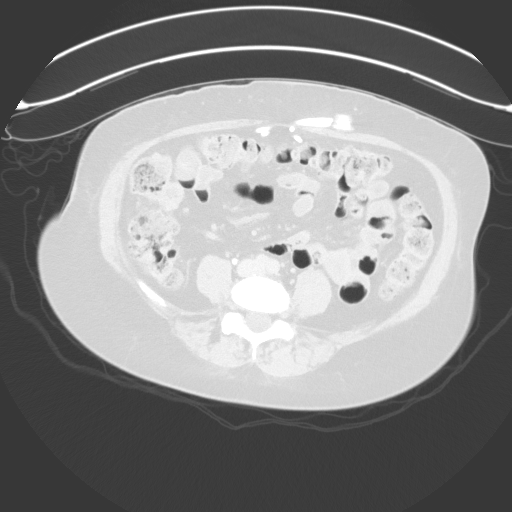
[im 56/89  soft-tissue]
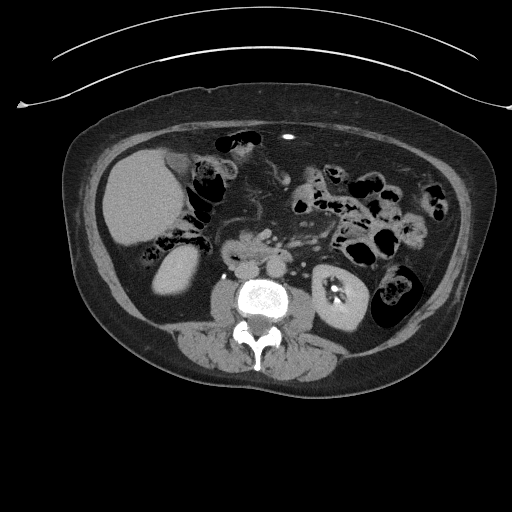
[im 56/89  lung]
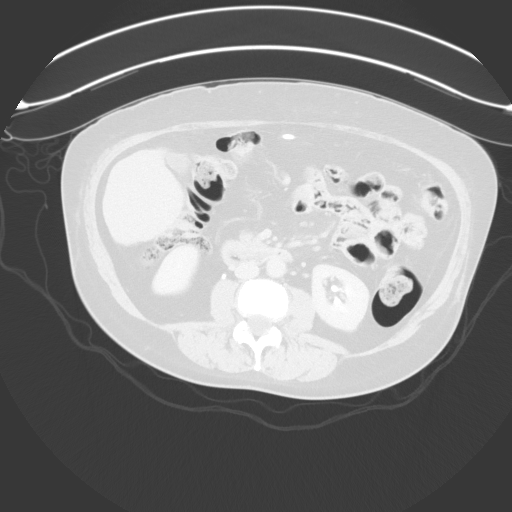
[im 67/89  soft-tissue]
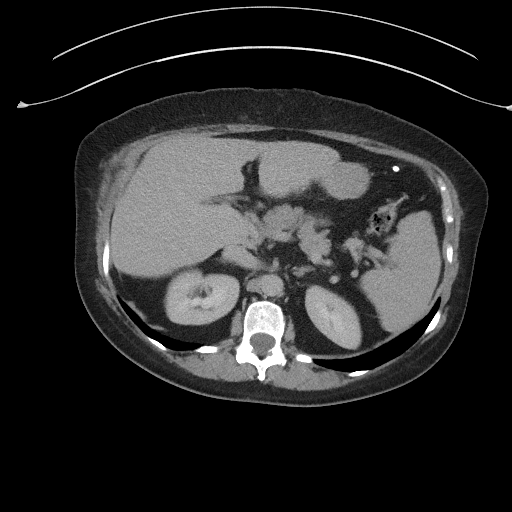
[im 67/89  lung]
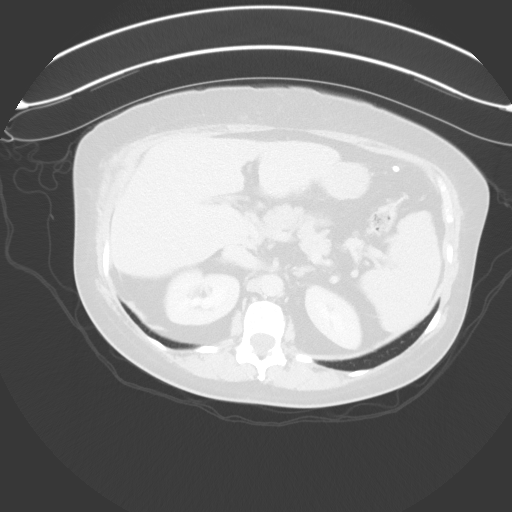
[im 78/89  soft-tissue]
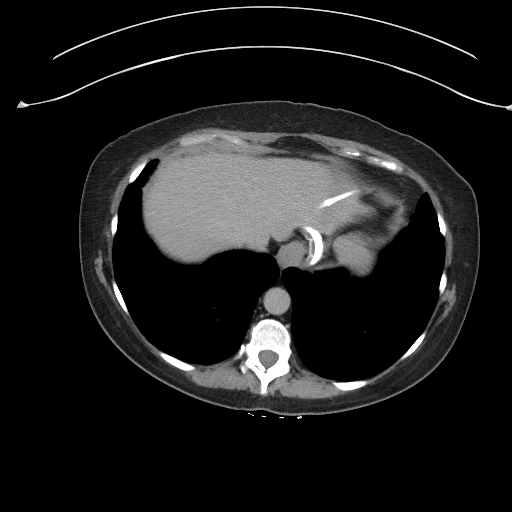
[im 78/89  lung]
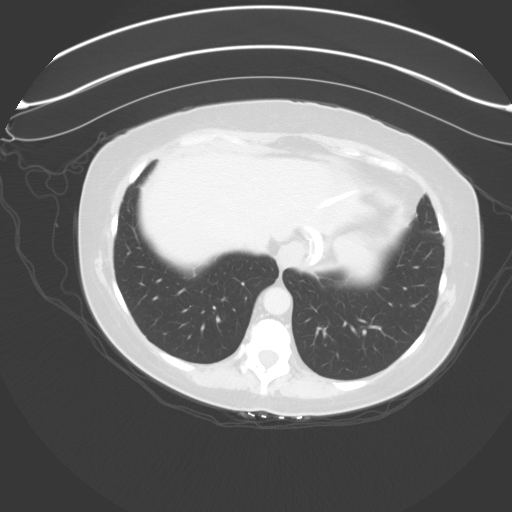

[11 of 46 positions shown; findings below may reference images not displayed]

RADIATION DOSE REDUCTION: This exam was performed according to the
departmental dose-optimization program which includes automated
exposure control, adjustment of the mA and/or kV according to
patient size and/or use of iterative reconstruction technique.

CONTRAST:  100mL OMNIPAQUE IOHEXOL 300 MG/ML  SOLN
FINDINGS: Lower chest: No acute abnormality.

Hepatobiliary: No suspicious hepatic lesion. Gallbladder is
unremarkable. No biliary ductal dilation.

Pancreas: No pancreatic ductal dilation or evidence of acute
inflammation.

Spleen: No splenomegaly or focal splenic lesion.

Adrenals/Urinary Tract: Bilateral adrenal glands appear normal.

No hydronephrosis. No renal, ureteral or bladder calculi identified.

No solid enhancing renal mass.

Kidneys demonstrate symmetric enhancement and excretion of contrast
material. No collecting system duplication. No suspicious filling
defect visualized within the opacified portions of the collecting
systems or ureters on delayed imaging. However, the left ureter is
not well opacified limiting evaluation.

Mild asymmetric wall thickening along the right side of the urinary
bladder for instance on image 55/10 which may be related to
underdistention but is nonspecific and incompletely evaluated.

Stomach/Bowel: No enteric contrast was administered. Prior gastric
band with access reservoir in the left anterior abdominal wall. No
pathologic dilation of large or small bowel. Terminal ileum appears
normal. Appendix is not confidently identified however there is no
pericecal inflammation. Moderate volume of formed stool the colon.
No evidence of acute bowel inflammation.

Vascular/Lymphatic: Scattered aortic atherosclerosis without
abdominal aortic aneurysm. No pathologically enlarged abdominal or
pelvic lymph nodes.

Reproductive: Uterus and bilateral adnexa are unremarkable.

Other: No significant abdominopelvic free fluid.

Musculoskeletal: Lower lumbar predominant degenerative changes
spine. Sacralization of the L5 vertebral body. No acute osseous
abnormality.
IMPRESSION: 1. Mild asymmetric wall thickening along the right side of the
urinary bladder, which may be related to underdistention but is
nonspecific and incompletely evaluated. Consider further evaluation
with cystoscopy.
2. No hydronephrosis.  No renal, ureteral or bladder calculi.
3. No solid enhancing renal mass
4.  Aortic Atherosclerosis ([id number]).

## 2023-12-24 ENCOUNTER — Other Ambulatory Visit (INDEPENDENT_AMBULATORY_CARE_PROVIDER_SITE_OTHER): Payer: Self-pay | Admitting: Nurse Practitioner

## 2023-12-24 DIAGNOSIS — I83813 Varicose veins of bilateral lower extremities with pain: Secondary | ICD-10-CM

## 2023-12-26 ENCOUNTER — Ambulatory Visit (INDEPENDENT_AMBULATORY_CARE_PROVIDER_SITE_OTHER): Payer: 59

## 2023-12-26 ENCOUNTER — Ambulatory Visit (INDEPENDENT_AMBULATORY_CARE_PROVIDER_SITE_OTHER): Payer: 59 | Admitting: Nurse Practitioner

## 2023-12-26 ENCOUNTER — Encounter (INDEPENDENT_AMBULATORY_CARE_PROVIDER_SITE_OTHER): Payer: Self-pay | Admitting: Nurse Practitioner

## 2023-12-26 VITALS — BP 107/66 | HR 79 | Resp 18 | Ht 61.0 in | Wt 203.8 lb

## 2023-12-26 DIAGNOSIS — I83813 Varicose veins of bilateral lower extremities with pain: Secondary | ICD-10-CM

## 2023-12-31 NOTE — Progress Notes (Signed)
Subjective:    Patient ID: Lynn Dennis, female    DOB: 1961/10/28, 63 y.o.   MRN: [id number] Chief Complaint  Patient presents with   New Patient (Initial Visit)    NP. reflux/consult. symptomatic VV. miller    The patient returns for followup evaluation 3 months after the initial visit. The patient continues to have pain in the lower extremities with dependency. The pain is lessened with elevation. Graduated compression stockings, Class I (20-30 mmHg), have been worn but the stockings do not eliminate the leg pain. Over-the-counter analgesics do not improve the symptoms. The degree of discomfort continues to interfere with daily activities. The patient notes the pain in the legs is causing problems with daily exercise, at the workplace and even with household activities and maintenance such as standing in the kitchen preparing meals and doing dishes.   Venous ultrasound shows normal deep venous system, no evidence of acute or chronic DVT.  Superficial reflux is present in the right great saphenous vein with reflux in the accessory saphenous vein as well    Review of Systems  Cardiovascular:  Positive for leg swelling.  All other systems reviewed and are negative.      Objective:   Physical Exam Vitals reviewed.  Constitutional:      Appearance: Normal appearance. She is normal weight.  HENT:     Nose: Nose normal.  Cardiovascular:     Rate and Rhythm: Normal rate.  Pulmonary:     Effort: Pulmonary effort is normal.  Musculoskeletal:        General: Tenderness present.  Skin:    General: Skin is warm and dry.  Neurological:     Mental Status: She is alert and oriented to person, place, and time.  Psychiatric:        Behavior: Behavior normal.        Thought Content: Thought content normal.        Judgment: Judgment normal.     BP 107/66   Pulse 79   Resp 18   Ht 5\' 1"  (1.549 m)   Wt 203 lb 12.8 oz (92.4 kg)   BMI 38.51 kg/m   Past Medical History:  Diagnosis  Date   Arthritis    neck   Cancer (HCC)    squamous cell skin cancer   GERD (gastroesophageal reflux disease)    only time is when lap band is loosened   History of hiatal hernia    fixed with lap band surgery   Pneumonia 2014   Sleep apnea    uses cpap    Social History   Socioeconomic History   Marital status: Married    Spouse name: Not on file   Number of children: Not on file   Years of education: Not on file   Highest education level: Not on file  Occupational History   Not on file  Tobacco Use   Smoking status: Never   Smokeless tobacco: Never  Vaping Use   Vaping status: Never Used  Substance and Sexual Activity   Alcohol use: Yes    Comment: occ wine   Drug use: No   Sexual activity: Yes  Other Topics Concern   Not on file  Social History Narrative   Not on file   Social Drivers of Health   Financial Resource Strain: Low Risk  (11/21/2023)   Received from [facility]   Overall Financial Resource Strain (CARDIA)    Difficulty of Paying Living Expenses: Not  hard at all  Food Insecurity: Low Risk  (11/26/2023)   Received from [facility]   Hunger Vital Sign    Worried About Running Out of Food in the Last Year: Never true    Ran Out of Food in the Last Year: Never true  Transportation Needs: No Transportation Needs (11/26/2023)   Received from [facility]    In the past 12 months, has lack of reliable transportation kept you from medical appointments, meetings, work or from getting things needed for daily living? : No  Physical Activity: Not on file  Stress: Not on file  Social Connections: Not on file  Intimate Partner Violence: Not on file    Past Surgical History:  Procedure Laterality Date   ABDOMINAL HYSTERECTOMY     CYSTO WITH HYDRODISTENSION N/A 03/20/2018   Procedure: CYSTOSCOPY/HYDRODISTENSION;  Surgeon: Vanna Scotland, MD;  Location: [facility];  Service: Urology;  Laterality: N/A;   CYSTO WITH  HYDRODISTENSION N/A 04/10/2022   Procedure: CYSTOSCOPY/HYDRODISTENSION WITH BLADDER BIOPSY ;  Surgeon: Vanna Scotland, MD;  Location: [facility];  Service: Urology;  Laterality: N/A;   CYSTOSCOPY WITH BIOPSY N/A 03/20/2018   Procedure: CYSTOSCOPY WITH Bladder BIOPSY;  Surgeon: Vanna Scotland, MD;  Location: [facility];  Service: Urology;  Laterality: N/A;   CYSTOSCOPY WITH FULGERATION N/A 03/20/2018   Procedure: CYSTOSCOPY WITH FULGERATION;  Surgeon: Vanna Scotland, MD;  Location: [facility];  Service: Urology;  Laterality: N/A;   FRACTURE SURGERY     fractured finger   HEEL SPUR SURGERY Right    HERNIA REPAIR     age 58 inguinal    KNEE ARTHROSCOPY Left    LAPAROSCOPIC GASTRIC BANDING     PANNICULECTOMY  04/2021   SHOULDER ARTHROSCOPY WITH DEBRIDEMENT AND BICEP TENDON REPAIR Right 08/12/2015   Procedure: SHOULDER ARTHROSCOPY WITH DEBRIDMENT, DECOMPRESSION, SLAP REPAIR, MINI OPEN BICEPS TENDONESIS;  Surgeon: Christena Flake, MD;  Location: [facility];  Service: Orthopedics;  Laterality: Right;   TONSILLECTOMY      Family History  Problem Relation Age of Onset   Breast cancer Neg Hx    Bladder Cancer Neg Hx    Kidney cancer Neg Hx     Allergies  Allergen Reactions   Prednisone Other (See Comments)    hallucinantions   Adhesive [Tape] Other (See Comments)    Skin discomfort with extended use   Gluten Meal     Gluten intolerant   Lactose Other (See Comments)    Gi upset   Lactose Intolerance (Gi) Other (See Comments)    Gi upset   Nsaids Other (See Comments)    Patient has lap band       Latest Ref Rng & Units 12/28/2014    1:57 PM 09/01/2013    1:21 PM 05/14/2012    2:19 AM  CBC  WBC 3.6 - 11.0 x10 3/mm 3 10.4  9.5    Hemoglobin 12.0 - 16.0 g/dL 09.8  11.9    Hematocrit 35.0 - 47.0 % 43.9  42.0  42.2   Platelets 150 - 440 x10 3/mm 3 225  222        CMP     Component Value Date/Time   NA 136 08/09/2015 0951   NA 137 09/01/2013 1321   K 4.0 08/09/2015 0951   K 3.8  09/01/2013 1321   CL 103 08/09/2015 0951   CL 105 09/01/2013 1321   CO2 25 08/09/2015 0951   CO2 25 09/01/2013 1321   GLUCOSE 123 (  H) 08/09/2015 0951   GLUCOSE 107 (H) 09/01/2013 1321   BUN 15 08/09/2015 0951   BUN 14 09/01/2013 1321   CREATININE 0.60 03/31/2022 0906   CREATININE 0.68 09/01/2013 1321   CALCIUM 9.1 08/09/2015 0951   CALCIUM 8.9 09/01/2013 1321   PROT 7.5 09/01/2013 1321   ALBUMIN 4.0 09/01/2013 1321   AST 32 09/01/2013 1321   ALT 39 09/01/2013 1321   ALKPHOS 134 09/01/2013 1321   BILITOT 0.4 09/01/2013 1321   GFRNONAA >60 08/09/2015 0951   GFRNONAA >60 09/01/2013 1321     No results found.     Assessment & Plan:   1. Varicose veins of both lower extremities with pain (Primary) Recommend:  The patient has had successful ablation of the previously incompetent saphenous venous system but still has persistent symptoms of pain and swelling that are having a negative impact on daily life and daily activities.  Patient should undergo injection sclerotherapy to treat the residual varicosities.  The risks, benefits and alternative therapies were reviewed in detail with the patient.  All questions were answered.  The patient agrees to proceed with sclerotherapy at their convenience of her left lower extremity.  The patient will continue wearing the graduated compression stockings and using the over-the-counter pain medications to treat her symptoms.    Recommend  I have reviewed my previous  discussion with the patient regarding  varicose veins and why they cause symptoms. Patient will continue  wearing graduated compression stockings class 1 on a daily basis, beginning first thing in the morning and removing them in the evening.  The patient is CEAP [id number].  The patient has been wearing compression for more than 12 weeks with no or little benefit.  The patient has been exercising daily for more than 12 weeks. The patient has been elevating and taking OTC pain  medications for more than 12 weeks.  None of these have have eliminated the pain related to the varicose veins and venous reflux or the discomfort regarding venous congestion.    In addition, behavioral modification including elevation during the day was again discussed and this will continue.  The patient has utilized over the counter pain medications and has been exercising.  However, at this time conservative therapy has not alleviated the patient's symptoms of leg pain and swelling  Recommend: laser ablation of the right great saphenous veins to eliminate the symptoms of pain and swelling of the lower extremities caused by the severe superficial venous reflux disease.    Current Outpatient Medications on File Prior to Visit  Medication Sig Dispense Refill   Aloe Vera 25 MG CAPS Take by mouth.     b complex vitamins capsule Take 1 capsule by mouth daily.     Calcium Carb-Cholecalciferol (OYSTER SHELL CALCIUM W/D) 500-5 MG-MCG TABS Take by mouth.     carboxymethylcellulose (REFRESH PLUS) 0.5 % SOLN Place 1 drop into both eyes at bedtime.     cetirizine (ZYRTEC) 10 MG tablet Take by mouth.     cholecalciferol (VITAMIN D3) 25 MCG (1000 UNIT) tablet Take 1,000 Units by mouth daily.     diazepam (VALIUM) 10 MG tablet Take 1 tablet (10 mg total) by mouth daily as needed (cystitis). 15 tablet 0   escitalopram (LEXAPRO) 5 MG tablet Take 5 mg by mouth every evening.     estradiol (ESTRACE) 1 MG tablet Take 1 mg by mouth daily.     Glucosamine-Chondroitin 500-400 MG CAPS Take by mouth.     HYDROcodone  bit-homatropine (HYCODAN) 5-1.5 MG/5ML syrup Take 5 mLs by mouth at bedtime as needed.     hydrOXYzine (ATARAX/VISTARIL) 10 MG tablet Take 10 mg by mouth daily as needed (cystitis).     latanoprost (XALATAN) 0.005 % ophthalmic solution Apply to eye.     meloxicam (MOBIC) 7.5 MG tablet Take 7.5 mg by mouth daily as needed for pain.     metoprolol tartrate (LOPRESSOR) 50 MG tablet Take by mouth.      metroNIDAZOLE (METROGEL) 1 % gel Apply 1 application  topically daily.     oxybutynin (DITROPAN) 5 MG tablet Take 1 tablet (5 mg total) by mouth every 8 (eight) hours as needed for bladder spasms. 30 tablet 11   triamcinolone cream (KENALOG) 0.1 % Apply 1 application  topically daily as needed (rash).     Vibegron (GEMTESA) 75 MG TABS Take 75 mg by mouth daily. 30 tablet 0   Ginkgo Biloba (GNP GINGKO BILOBA EXTRACT PO) Take 500 mg by mouth daily. (Patient not taking: Reported on 12/26/2023)     psyllium (REGULOID) 0.52 g capsule Take 0.52 g by mouth daily. (Patient not taking: Reported on 12/26/2023)     No current facility-administered medications on file prior to visit.    There are no Patient Instructions on file for this visit. No follow-ups on file.   Georgiana Spinner, NP

## 2024-04-29 ENCOUNTER — Encounter (INDEPENDENT_AMBULATORY_CARE_PROVIDER_SITE_OTHER): Payer: Self-pay
